# Patient Record
Sex: Male | Born: 1960 | Race: White | Hispanic: No | Marital: Single | Smoking: Former smoker
Health system: Southern US, Community
[De-identification: ages and names within clinical notes are randomized; demographics above are authoritative.]

## PROBLEM LIST (undated history)

## (undated) DIAGNOSIS — I10 Essential (primary) hypertension: Secondary | ICD-10-CM

## (undated) DIAGNOSIS — K219 Gastro-esophageal reflux disease without esophagitis: Secondary | ICD-10-CM

## (undated) DIAGNOSIS — J449 Chronic obstructive pulmonary disease, unspecified: Secondary | ICD-10-CM

## (undated) DIAGNOSIS — B182 Chronic viral hepatitis C: Secondary | ICD-10-CM

## (undated) DIAGNOSIS — J45909 Unspecified asthma, uncomplicated: Secondary | ICD-10-CM

## (undated) HISTORY — PX: OTHER SURGICAL HISTORY: SHX169

## (undated) HISTORY — DX: Chronic viral hepatitis C: B18.2

---

## 2015-05-08 ENCOUNTER — Encounter: Payer: Self-pay | Admitting: Gastroenterology

## 2015-05-08 ENCOUNTER — Other Ambulatory Visit: Payer: Self-pay

## 2015-05-08 ENCOUNTER — Ambulatory Visit (INDEPENDENT_AMBULATORY_CARE_PROVIDER_SITE_OTHER): Payer: Self-pay | Admitting: Gastroenterology

## 2015-05-08 VITALS — BP 120/74 | HR 73 | Temp 98.2°F | Ht 72.0 in | Wt 160.6 lb

## 2015-05-08 DIAGNOSIS — R634 Abnormal weight loss: Secondary | ICD-10-CM

## 2015-05-08 DIAGNOSIS — B182 Chronic viral hepatitis C: Secondary | ICD-10-CM | POA: Insufficient documentation

## 2015-05-08 DIAGNOSIS — R110 Nausea: Secondary | ICD-10-CM

## 2015-05-08 DIAGNOSIS — R1013 Epigastric pain: Secondary | ICD-10-CM | POA: Insufficient documentation

## 2015-05-08 NOTE — Progress Notes (Signed)
Primary Care Physician:  Alda LeaMcElroy, Shannon G, PA-C  Primary Gastroenterologist:  Roetta SessionsMichael Rourk, MD   Chief Complaint  Patient presents with  . Hepatitis C    HPI:  Edgar Roberts is a 54 y.o. male here for further management of chronic Hepatitis C. Ref[facility] by [facility]. [place] but no treatment offered at that time due to ongoing alcohol use. Recent LFTs normal. HCV RNA 9,604,5403,390,742 IU/mL, genotype 1b. No liver imaging available.   Patient states he has been 15 months without alcohol. Decided to quit after he watched his dad die from cirrhosis. About 18 months ago, patient had several episodes of melena, hematemesis in setting of etoh use. Never went to the doctor.  No further bleeding off of etoh but continues to have significant UGI symptoms.   Currently patient has ongoing anorexia, daily nausea without vomiting, epigastric pain. Doesn't like to eat. Has to force himself. Smokes marijuana for relief. Denies other illicit drugs.  Has lost about 20 pounds in the past one year per patient.   Sometimes with ruq pain radiating into right flank (stabbing) which lasts for 4-5 minutes and happens several times in a row before subsiding. This happens at least 3 times per week. This pain is unrelated to food or BMs. Going on for several months. Not sure if anything makes worse. Nothing has made it better. Denies melena, brbpr. No prior colonoscopy or EGD.   Current Outpatient Prescriptions  Medication Sig Dispense Refill  . Albuterol Sulfate (VENTOLIN HFA IN) Inhale into the lungs.    . Fluticasone-Salmeterol (ADVAIR HFA IN) Inhale into the lungs.     No current facility-administered medications for this visit.    Allergies as of 05/08/2015  . (No Known Allergies)    Past Medical History  Diagnosis Date  . Chronic hepatitis C     Past Surgical History  Procedure Laterality Date  . None      Family History  Problem Relation Age of Onset   . Cirrhosis Father     deceased  . Colon cancer Neg Hx   . Heart disease Sister   . Heart disease Mother     History   Social History  . Marital Status: Single    Spouse Name: N/A  . Number of Children: N/A  . Years of Education: N/A   Occupational History  . Not on file.   Social History Main Topics  . Smoking status: Never Smoker   . Smokeless tobacco: Not on file  . Alcohol Use: No     Comment: quit 01/2014  . Drug Use: Yes    Special: Marijuana     Comment: everyday  . Sexual Activity: Not on file   Other Topics Concern  . Not on file   Social History Narrative      ROS:  General: Negative for  fever, chills, fatigue, weakness. See hpi. Eyes: Negative for vision changes.  ENT: Negative for hoarseness, difficulty swallowing , nasal congestion. CV: Negative for chest pain, angina, palpitations, dyspnea on exertion, peripheral edema.  Respiratory: Negative for dyspnea at rest, dyspnea on exertion, cough, sputum, wheezing.  GI: See history of present illness. GU:  Negative for dysuria, hematuria, urinary incontinence, urinary frequency, nocturnal urination.  MS: Negative for joint pain, low back pain.  Derm: Negative for rash or itching.  Neuro: Negative for weakness, abnormal sensation, seizure, frequent headaches, memory loss, confusion.  Psych: Negative for anxiety, depression, suicidal ideation, hallucinations.  Endo: see hpi Heme: Negative for bruising or bleeding. Allergy: Negative for rash or hives.    Physical Examination:  BP 120/74 mmHg  Pulse 73  Temp(Src) 98.2 F (36.8 C) (Oral)  Ht 6' (1.829 m)  Wt 160 lb 9.6 oz (72.848 kg)  BMI 21.78 kg/m2   General: Well-nourished, well-developed in no acute distress.  Head: Normocephalic, atraumatic.   Eyes: Conjunctiva pink, no icterus. Mouth: Oropharyngeal mucosa moist and pink , no lesions erythema or exudate. Neck: Supple without thyromegaly, masses, or lymphadenopathy.  Lungs: Clear to  auscultation bilaterally.  Heart: Regular rate and rhythm, no murmurs rubs or gallops.  Abdomen: Bowel sounds are normal, moderate epigastric tenderness, nondistended, no hepatosplenomegaly or masses, no abdominal bruits or    hernia , no rebound or guarding.   Rectal: not performed Extremities: No lower extremity edema. No clubbing or deformities.  Neuro: Alert and oriented x 4 , grossly normal neurologically.  Skin: Warm and dry, no rash or jaundice.   Psych: Alert and cooperative, normal mood and affect.  Labs: Labs from 03/21/2015 White blood cell count 5600, hemoglobin 16.6, hematocrit 47.2, platelets 218,000, sodium 139, potassium 4.6, BUN 8, creatinine 0.87, total bilirubin 0.5, alkaline phosphatase 56, AST 36, ALT 48, albumen 4.5, calcium 9.6, HCV RNA 4,098,119, HCV genotype 1B  Imaging Studies: No results found.

## 2015-05-08 NOTE — Patient Instructions (Signed)
1. Upper endoscopy to evaluate upper abdominal pain, nausea, weight loss. Please see separate instructions. 2. Abdominal ultrasound prior to hepatitis C treatment as discussed. 3. Please have your lab work done. 4. We will start the Harvoni (hepatitis C medication) approval process. When you start your medication, it is very important that you take the medication at the same time every day without missing any dosages. Also you must notify us if you plan on starting any new medications whether by prescription or over-the-counter because some medications make the hepatitis C drug less effective. 5. Return to the office in 6 weeks for follow-up.

## 2015-05-09 ENCOUNTER — Encounter: Payer: Self-pay | Admitting: Gastroenterology

## 2015-05-09 LAB — CBC WITH DIFFERENTIAL/PLATELET
Basophils Absolute: 0.1 10*3/uL (ref 0.0–0.1)
Basophils Relative: 1 % (ref 0–1)
Eosinophils Absolute: 0.2 10*3/uL (ref 0.0–0.7)
Eosinophils Relative: 4 % (ref 0–5)
HEMATOCRIT: 46.7 % (ref 39.0–52.0)
Hemoglobin: 16.4 g/dL (ref 13.0–17.0)
LYMPHS PCT: 35 % (ref 12–46)
Lymphs Abs: 2 10*3/uL (ref 0.7–4.0)
MCH: 33.4 pg (ref 26.0–34.0)
MCHC: 35.1 g/dL (ref 30.0–36.0)
MCV: 95.1 fL (ref 78.0–100.0)
MONO ABS: 0.5 10*3/uL (ref 0.1–1.0)
MPV: 10.1 fL (ref 8.6–12.4)
Monocytes Relative: 9 % (ref 3–12)
NEUTROS ABS: 2.9 10*3/uL (ref 1.7–7.7)
Neutrophils Relative %: 51 % (ref 43–77)
Platelets: 192 10*3/uL (ref 150–400)
RBC: 4.91 MIL/uL (ref 4.22–5.81)
RDW: 14.5 % (ref 11.5–15.5)
WBC: 5.6 10*3/uL (ref 4.0–10.5)

## 2015-05-09 LAB — BASIC METABOLIC PANEL
BUN: 8 mg/dL (ref 6–23)
CALCIUM: 10.1 mg/dL (ref 8.4–10.5)
CO2: 28 mEq/L (ref 19–32)
Chloride: 101 mEq/L (ref 96–112)
Creat: 0.76 mg/dL (ref 0.50–1.35)
GLUCOSE: 72 mg/dL (ref 70–99)
POTASSIUM: 4.2 meq/L (ref 3.5–5.3)
Sodium: 142 mEq/L (ref 135–145)

## 2015-05-09 LAB — HEPATIC FUNCTION PANEL
ALT: 43 U/L (ref 0–53)
AST: 32 U/L (ref 0–37)
Albumin: 4.5 g/dL (ref 3.5–5.2)
Alkaline Phosphatase: 55 U/L (ref 39–117)
BILIRUBIN INDIRECT: 0.5 mg/dL (ref 0.2–1.2)
Bilirubin, Direct: 0.1 mg/dL (ref 0.0–0.3)
Total Bilirubin: 0.6 mg/dL (ref 0.2–1.2)
Total Protein: 7.3 g/dL (ref 6.0–8.3)

## 2015-05-09 LAB — PROTIME-INR
INR: 1 (ref <1.50)
Prothrombin Time: 13.2 seconds (ref 11.6–15.2)

## 2015-05-09 NOTE — Assessment & Plan Note (Signed)
Chronic epigastric pain associated with nausea, anorexia, weight loss. UGI bleeding noted last year without evaluation. No etoh in 15 months. Ddx includes gastritis, PUD, malignancy, biliary. Recommend EGD for further evaluation with deep sedation given h/o previous etoh abuse.  I have discussed the risks, alternatives, benefits with regards to but not limited to the risk of reaction to medication, bleeding, infection, perforation and the patient is agreeable to proceed. Written consent to be obtained.  Patient wants to wait on starting medication such as PPI until after procedure.

## 2015-05-09 NOTE — Assessment & Plan Note (Signed)
Chronic HCV, genotype 1b, treatment naive. No longer consuming etoh. Status of liver disease unknown but his platelet counts, LFTs are unremarkable making cirrhosis less likely. Obtain appropriate labs. Abdominal u/s with elastography. Move towards Harvoni approval, pending EGD findings.

## 2015-05-11 NOTE — Patient Instructions (Signed)
Edgar Roberts  05/11/2015     @   Your procedure is scheduled on 05/18/2015.  Report to [facility] at 12:45 A.M.  Call this number if you have problems the morning of surgery:  [phone]   Remember:  Do not eat food or drink liquids after midnight.  Take these medicines the morning of surgery with A SIP OF WATER N/A   Use Albuterol inhaler and bring with you.  Also use Advair am of procedure.   Do not wear jewelry, make-up or nail polish.  Do not wear lotions, powders, or perfumes.  You may wear deodorant.  Do not shave 48 hours prior to surgery.  Men may shave face and neck.  Do not bring valuables to the hospital.  [facility] is not responsible for any belongings or valuables.  Contacts, dentures or bridgework may not be worn into surgery.  Leave your suitcase in the car.  After surgery it may be brought to your room.  For patients admitted to the hospital, discharge time will be determined by your treatment team.  Patients discharged the day of surgery will not be allowed to drive home.    Please read over the following fact sheets that you were given. Anesthesia Post-op Instructions      Esophagogastroduodenoscopy Esophagogastroduodenoscopy (EGD) is a procedure to examine the lining of the esophagus, stomach, and first part of the small intestine (duodenum). A long, flexible, lighted tube with a camera attached (endoscope) is inserted down the throat to view these organs. This procedure is done to detect problems or abnormalities, such as inflammation, bleeding, ulcers, or growths, in order to treat them. The procedure lasts about 5-20 minutes. It is usually an outpatient procedure, but it may need to be performed in emergency cases in the hospital. LET YOUR CAREGIVER KNOW ABOUT:   Allergies to food or medicine.  All medicines you are taking, including vitamins, herbs, eyedrops, and over-the-counter medicines and creams.  Use of steroids (by  mouth or creams).  Previous problems you or members of your family have had with the use of anesthetics.  Any blood disorders you have.  Previous surgeries you have had.  Other health problems you have.  Possibility of pregnancy, if this applies. RISKS AND COMPLICATIONS  Generally, EGD is a safe procedure. However, as with any procedure, complications can occur. Possible complications include:  Infection.  Bleeding.  Tearing (perforation) of the esophagus, stomach, or duodenum.  Difficulty breathing or not being able to breath.  Excessive sweating.  Spasms of the larynx.  Slowed heartbeat.  Low blood pressure. BEFORE THE PROCEDURE  Do not eat or drink anything for 6-8 hours before the procedure or as directed by your caregiver.  Ask your caregiver about changing or stopping your regular medicines.  If you wear dentures, be prepared to remove them before the procedure.  Arrange for someone to drive you home after the procedure. PROCEDURE   A vein will be accessed to give medicines and fluids. A medicine to relax you (sedative) and a pain reliever will be given through that access into the vein.  A numbing medicine (local anesthetic) may be sprayed on your throat for comfort and to stop you from gagging or coughing.  A mouth guard may be placed in your mouth to protect your teeth and to keep you from biting on the endoscope.  You will be asked to lie on your left side.  The endoscope is inserted down your throat and into the  esophagus, stomach, and duodenum.  Air is put through the endoscope to allow your caregiver to view the lining of your esophagus clearly.  The esophagus, stomach, and duodenum is then examined. During the exam, your caregiver may:  Remove tissue to be examined under a microscope (biopsy) for inflammation, infection, or other medical problems.  Remove growths.  Remove objects (foreign bodies) that are stuck.  Treat any bleeding with  medicines or other devices that stop tissues from bleeding (hot cautery, clipping devices).  Widen (dilate) or stretch narrowed areas of the esophagus and stomach.  The endoscope will then be withdrawn. AFTER THE PROCEDURE  You will be taken to a recovery area to be monitored. You will be able to go home once you are stable and alert.  Do not eat or drink anything until the local anesthetic and numbing medicines have worn off. You may choke.  It is normal to feel bloated, have pain with swallowing, or have a sore throat for a short time. This will wear off.  Your caregiver should be able to discuss his or her findings with you. It will take longer to discuss the test results if any biopsies were taken. Document Released: 02/14/2005 Document Revised: 02/28/2014 Document Reviewed: 09/16/2012 [facility] Patient Information 2015 [employer] [place]. This information is not intended to replace advice given to you by your health care provider. Make sure you discuss any questions you have with your health care provider.

## 2015-05-11 NOTE — Progress Notes (Signed)
Quick Note:  Labs normal. Await u/s and proceed with EGD as scheduled. ______

## 2015-05-12 ENCOUNTER — Other Ambulatory Visit: Payer: Self-pay

## 2015-05-12 ENCOUNTER — Encounter (HOSPITAL_COMMUNITY): Payer: Self-pay

## 2015-05-12 ENCOUNTER — Encounter (HOSPITAL_COMMUNITY)
Admission: RE | Admit: 2015-05-12 | Discharge: 2015-05-12 | Disposition: A | Discharge status: Home / Self Care | Payer: Self-pay | Source: Ambulatory Visit | Attending: Internal Medicine | Admitting: Internal Medicine

## 2015-05-12 DIAGNOSIS — Z01818 Encounter for other preprocedural examination: Secondary | ICD-10-CM | POA: Insufficient documentation

## 2015-05-12 HISTORY — DX: Unspecified asthma, uncomplicated: J45.909

## 2015-05-12 HISTORY — DX: Chronic obstructive pulmonary disease, unspecified: J44.9

## 2015-05-12 NOTE — Pre-Procedure Instructions (Signed)
Patient given information to sign up for my chart at home. 

## 2015-05-12 NOTE — Progress Notes (Signed)
CC'ED TO PCP 

## 2015-05-18 ENCOUNTER — Ambulatory Visit (HOSPITAL_COMMUNITY): Admission: RE | Admit: 2015-05-18 | Payer: Self-pay | Source: Ambulatory Visit | Admitting: Internal Medicine

## 2015-05-18 ENCOUNTER — Encounter (HOSPITAL_COMMUNITY): Admission: RE | Payer: Self-pay | Source: Ambulatory Visit

## 2015-05-18 SURGERY — ESOPHAGOGASTRODUODENOSCOPY (EGD) WITH PROPOFOL
Anesthesia: Monitor Anesthesia Care

## 2015-05-18 NOTE — OR Nursing (Signed)
Patient called to cancelled procedure , stated he has no transportation , he is calling office to reschedule.

## 2015-05-30 ENCOUNTER — Ambulatory Visit (HOSPITAL_COMMUNITY): Admission: RE | Admit: 2015-05-30 | Payer: Self-pay | Source: Ambulatory Visit

## 2015-06-19 ENCOUNTER — Encounter: Payer: Self-pay | Admitting: Gastroenterology

## 2015-06-19 ENCOUNTER — Other Ambulatory Visit: Payer: Self-pay

## 2015-06-19 ENCOUNTER — Ambulatory Visit (INDEPENDENT_AMBULATORY_CARE_PROVIDER_SITE_OTHER): Payer: Self-pay | Admitting: Gastroenterology

## 2015-06-19 VITALS — BP 124/83 | HR 79 | Temp 97.3°F | Ht 72.0 in | Wt 159.0 lb

## 2015-06-19 DIAGNOSIS — B182 Chronic viral hepatitis C: Secondary | ICD-10-CM

## 2015-06-19 DIAGNOSIS — R079 Chest pain, unspecified: Secondary | ICD-10-CM

## 2015-06-19 DIAGNOSIS — R1013 Epigastric pain: Secondary | ICD-10-CM

## 2015-06-19 DIAGNOSIS — K219 Gastro-esophageal reflux disease without esophagitis: Secondary | ICD-10-CM

## 2015-06-19 MED ORDER — DEXLANSOPRAZOLE 60 MG PO CPDR: 60.0000 mg | DELAYED_RELEASE_CAPSULE | Freq: Every day | ORAL | Status: DC

## 2015-06-19 NOTE — Assessment & Plan Note (Signed)
Chronic HCV, genotype 1b, treatment naive. abd u/s with elastography planned.

## 2015-06-19 NOTE — Progress Notes (Signed)
Primary Care Physician: Alda Lea  Primary Gastroenterologist:  Roetta Sessions, MD   Chief Complaint  Patient presents with  . Follow-up    needs to reschedule tests    HPI: Edgar Roberts is a 54 y.o. male here to reschedule his EGD and U/S with elastography. He had to cancel his last one. He has h/o chronic HCV seen years ago by [facility] in [place] but no treatment offered at that time due to etoh use. Recent LFTs normal. HCV RNA 7,829,562 IU/mL, genotype 1b. No liver imaging available.   Patient states he has been 15 months without alcohol. Decided to quit after he watched his dad die from cirrhosis. About 18 months ago, patient had several episodes of melena, hematemesis in setting of etoh use. Never went to the doctor. No further bleeding off of etoh but continues to have significant UGI symptoms.   Currently patient has ongoing anorexia, daily nausea without vomiting, epigastric pain. Doesn't like to eat. Has to force himself. Smokes marijuana for relief. Denies other illicit drugs. Has lost about 20 pounds in the past one year per patient.   Sometimes with ruq pain radiating into right flank (stabbing) which lasts for 4-5 minutes and happens several times in a row before subsiding. This happens at least 3 times per week. This pain is unrelated to food or BMs. Going on for several months. Not sure if anything makes worse. Nothing has made it better. Denies melena, brbpr. No prior colonoscopy or EGD.   Four weeks, feels weak. No noted fever. Feels dizzy. SOB. Pain in epigastric region with eating and drinking. Complains of some chest pain with pain into left shoulder and left hand goes numb sometimes. Happens with movement and at rest. Never been evaluated for this. Family history of heart disease.   Doesn't want a colonoscopy.      Current Outpatient Prescriptions  Medication Sig Dispense Refill  . albuterol (PROVENTIL HFA;VENTOLIN HFA) 108 (90 BASE)  MCG/ACT inhaler Inhale 2 puffs into the lungs every 6 (six) hours as needed for wheezing or shortness of breath.    . fluticasone-salmeterol (ADVAIR HFA) 230-21 MCG/ACT inhaler Inhale 2 puffs into the lungs 2 (two) times daily.    . Naphazoline-Glycerin (CLEAR EYES REDNESS RELIEF OP) Apply 1 drop to eye daily as needed (red eyes).     No current facility-administered medications for this visit.    Allergies as of 06/19/2015  . (No Known Allergies)   Past Medical History  Diagnosis Date  . Chronic hepatitis C   . COPD (chronic obstructive pulmonary disease)   . Asthma    Past Surgical History  Procedure Laterality Date  . None     Family History  Problem Relation Age of Onset  . Cirrhosis Father     deceased  . Colon cancer Neg Hx   . Heart disease Sister     3 open heart surgeries  . Heart disease Mother   . Heart disease Other     grandmother   Social History   Social History  . Marital Status: Single    Spouse Name: N/A  . Number of Children: N/A  . Years of Education: N/A   Social History Main Topics  . Smoking status: Former Smoker -- 2.00 packs/day for 5 years    Types: Cigarettes    Quit date: 05/12/1983  . Smokeless tobacco: None     Comment: Quit in 1984  . Alcohol Use: No  Comment: quit 01/2014  . Drug Use: Yes    Special: Marijuana     Comment: everyday  . Sexual Activity: Yes    Birth Control/ Protection: None   Other Topics Concern  . None   Social History Narrative    ROS:  General: Negative for anorexia, weight loss, fever, chills, fatigue, weakness. ENT: Negative for hoarseness, difficulty swallowing , nasal congestion. CV: Negative for chest pain, angina, palpitations, dyspnea on exertion, peripheral edema.  Respiratory: Negative for dyspnea at rest, dyspnea on exertion, cough, sputum, wheezing.  GI: See history of present illness. GU:  Negative for dysuria, hematuria, urinary incontinence, urinary frequency, nocturnal urination.    Endo: Negative for unusual weight change.    Physical Examination:   BP 124/83 mmHg  Pulse 79  Temp(Src) 97.3 F (36.3 C) (Oral)  Ht 6' (1.829 m)  Wt 159 lb (72.122 kg)  BMI 21.56 kg/m2  General: Well-nourished, well-developed in no acute distress.  Eyes: No icterus. Mouth: Oropharyngeal mucosa moist and pink , no lesions erythema or exudate. Lungs: Clear to auscultation bilaterally.  Heart: Regular rate and rhythm, no murmurs rubs or gallops.  Abdomen: Bowel sounds are normal, epigastric tenderness, nondistended, no hepatosplenomegaly or masses, no abdominal bruits or hernia , no rebound or guarding.   Extremities: No lower extremity edema. No clubbing or deformities. Neuro: Alert and oriented x 4   Skin: Warm and dry, no jaundice.   Psych: Alert and cooperative, normal mood and affect.  Labs:  Lab Results  Component Value Date   WBC 5.6 05/08/2015   HGB 16.4 05/08/2015   HCT 46.7 05/08/2015   MCV 95.1 05/08/2015   PLT 192 05/08/2015   Lab Results  Component Value Date   CREATININE 0.76 05/08/2015   BUN 8 05/08/2015   NA 142 05/08/2015   K 4.2 05/08/2015   CL 101 05/08/2015   CO2 28 05/08/2015   Lab Results  Component Value Date   ALT 43 05/08/2015   AST 32 05/08/2015   ALKPHOS 55 05/08/2015   BILITOT 0.6 05/08/2015    Imaging Studies: No results found.

## 2015-06-19 NOTE — Assessment & Plan Note (Signed)
Recommend evaluation by cardiology prior to EGD. Warning signs discussed. Patient in agreement.

## 2015-06-19 NOTE — Assessment & Plan Note (Signed)
Chronic epigastric pain associated with nausea, anorexia, weight loss, remote UGI bleeding never evaluated. No etoh in 15 months. Ddx includes gastritis, PUD, malignancy, biliary. EGD with deep sedation recommended. Cardiology evaluation due to h/o chest pain prior to EGD.  I have discussed the risks, alternatives, benefits with regards to but not limited to the risk of reaction to medication, bleeding, infection, perforation and the patient is agreeable to proceed. Written consent to be obtained.  Start PPI. Patient assistance forms for Dexilant provided. Start Prilosec OTC daily in interim.

## 2015-06-19 NOTE — Patient Instructions (Signed)
1. Upper endoscopy with Dr. Jena Gauss in 3-4 weeks as scheduled. You need to see cardiologist prior to procedure to be cleared due to chest pain. 2. Start Dexilant once daily before breakfast. Samples provided.  3. Please have your ultrasound done as scheduled.  4. Cardiology referral for chest pain.  5. Go to ER if your chest pain is persistent or increased severity, associated with sweating, dizziness, shortness of breath.

## 2015-06-20 NOTE — Progress Notes (Signed)
cc'ed to pcp °

## 2015-06-26 ENCOUNTER — Ambulatory Visit (HOSPITAL_COMMUNITY)
Admission: RE | Admit: 2015-06-26 | Discharge: 2015-06-26 | Disposition: A | Discharge status: Home / Self Care | Payer: Self-pay | Source: Ambulatory Visit | Attending: Gastroenterology | Admitting: Gastroenterology

## 2015-06-26 DIAGNOSIS — B182 Chronic viral hepatitis C: Secondary | ICD-10-CM | POA: Insufficient documentation

## 2015-06-26 DIAGNOSIS — R932 Abnormal findings on diagnostic imaging of liver and biliary tract: Secondary | ICD-10-CM | POA: Insufficient documentation

## 2015-06-26 DIAGNOSIS — R1011 Right upper quadrant pain: Secondary | ICD-10-CM | POA: Insufficient documentation

## 2015-06-26 DIAGNOSIS — R110 Nausea: Secondary | ICD-10-CM

## 2015-06-26 DIAGNOSIS — R634 Abnormal weight loss: Secondary | ICD-10-CM

## 2015-06-26 DIAGNOSIS — R1013 Epigastric pain: Secondary | ICD-10-CM

## 2015-07-06 NOTE — Progress Notes (Signed)
Quick Note:  No evidence of cirrhosis on u/s but increased fibrosis score.  Gallbladder abnormal with adenomyomatosis, ?sludge balls too.  I would recommend completing EGD as scheduled.  After EGD, he will likely need to see surgeon for gallbladder surgery. ______

## 2015-07-07 NOTE — Patient Instructions (Signed)
Edgar Roberts  07/07/2015     @PREFPERIOPPHARMACY @   Your procedure is scheduled on  07/13/2015   Report to [facility] at  830  A.M.  Call this number if you have problems the morning of surgery:  [phone]   Remember:  Do not eat food or drink liquids after midnight.  Take these medicines the morning of surgery with A SIP OF WATER dexilant. Take your inhaler before you come and bring it with you.   Do not wear jewelry, make-up or nail polish.  Do not wear lotions, powders, or perfumes.  You may wear deodorant.  Do not shave 48 hours prior to surgery.  Men may shave face and neck.  Do not bring valuables to the hospital.  [facility] is not responsible for any belongings or valuables.  Contacts, dentures or bridgework may not be worn into surgery.  Leave your suitcase in the car.  After surgery it may be brought to your room.  For patients admitted to the hospital, discharge time will be determined by your treatment team.  Patients discharged the day of surgery will not be allowed to drive home.   Name and phone number of your driver:   family Special instructions:  Follow the diet instructions given to you by Dr Luvenia Starch office. Please read over the following fact sheets that you were given. Pain Booklet, Coughing and Deep Breathing, MRSA Information, Surgical Site Infection Prevention and Anesthesia Post-op Instructions      Esophagogastroduodenoscopy Esophagogastroduodenoscopy (EGD) is a procedure to examine the lining of the esophagus, stomach, and first part of the small intestine (duodenum). A long, flexible, lighted tube with a camera attached (endoscope) is inserted down the throat to view these organs. This procedure is done to detect problems or abnormalities, such as inflammation, bleeding, ulcers, or growths, in order to treat them. The procedure lasts about 5-20 minutes. It is usually an outpatient procedure, but it may need to be performed in emergency  cases in the hospital. LET YOUR CAREGIVER KNOW ABOUT:   Allergies to food or medicine.  All medicines you are taking, including vitamins, herbs, eyedrops, and over-the-counter medicines and creams.  Use of steroids (by mouth or creams).  Previous problems you or members of your family have had with the use of anesthetics.  Any blood disorders you have.  Previous surgeries you have had.  Other health problems you have.  Possibility of pregnancy, if this applies. RISKS AND COMPLICATIONS  Generally, EGD is a safe procedure. However, as with any procedure, complications can occur. Possible complications include:  Infection.  Bleeding.  Tearing (perforation) of the esophagus, stomach, or duodenum.  Difficulty breathing or not being able to breath.  Excessive sweating.  Spasms of the larynx.  Slowed heartbeat.  Low blood pressure. BEFORE THE PROCEDURE  Do not eat or drink anything for 6-8 hours before the procedure or as directed by your caregiver.  Ask your caregiver about changing or stopping your regular medicines.  If you wear dentures, be prepared to remove them before the procedure.  Arrange for someone to drive you home after the procedure. PROCEDURE   A vein will be accessed to give medicines and fluids. A medicine to relax you (sedative) and a pain reliever will be given through that access into the vein.  A numbing medicine (local anesthetic) may be sprayed on your throat for comfort and to stop you from gagging or coughing.  A mouth guard may be  placed in your mouth to protect your teeth and to keep you from biting on the endoscope.  You will be asked to lie on your left side.  The endoscope is inserted down your throat and into the esophagus, stomach, and duodenum.  Air is put through the endoscope to allow your caregiver to view the lining of your esophagus clearly.  The esophagus, stomach, and duodenum is then examined. During the exam, your  caregiver may:  Remove tissue to be examined under a microscope (biopsy) for inflammation, infection, or other medical problems.  Remove growths.  Remove objects (foreign bodies) that are stuck.  Treat any bleeding with medicines or other devices that stop tissues from bleeding (hot cautery, clipping devices).  Widen (dilate) or stretch narrowed areas of the esophagus and stomach.  The endoscope will then be withdrawn. AFTER THE PROCEDURE  You will be taken to a recovery area to be monitored. You will be able to go home once you are stable and alert.  Do not eat or drink anything until the local anesthetic and numbing medicines have worn off. You may choke.  It is normal to feel bloated, have pain with swallowing, or have a sore throat for a short time. This will wear off.  Your caregiver should be able to discuss his or her findings with you. It will take longer to discuss the test results if any biopsies were taken. Document Released: 02/14/2005 Document Revised: 02/28/2014 Document Reviewed: 09/16/2012 [facility] Patient Information 2015 [place], [place]. This information is not intended to replace advice given to you by your health care provider. Make sure you discuss any questions you have with your health care provider. PATIENT INSTRUCTIONS POST-ANESTHESIA  IMMEDIATELY FOLLOWING SURGERY:  Do not drive or operate machinery for the first twenty four hours after surgery.  Do not make any important decisions for twenty four hours after surgery or while taking narcotic pain medications or sedatives.  If you develop intractable nausea and vomiting or a severe headache please notify your doctor immediately.  FOLLOW-UP:  Please make an appointment with your surgeon as instructed. You do not need to follow up with anesthesia unless specifically instructed to do so.  WOUND CARE INSTRUCTIONS (if applicable):  Keep a dry clean dressing on the anesthesia/puncture wound site if there is drainage.   Once the wound has quit draining you may leave it open to air.  Generally you should leave the bandage intact for twenty four hours unless there is drainage.  If the epidural site drains for more than 36-48 hours please call the anesthesia department.  QUESTIONS?:  Please feel free to call your physician or the hospital operator if you have any questions, and they will be happy to assist you.

## 2015-07-10 ENCOUNTER — Other Ambulatory Visit (HOSPITAL_COMMUNITY): Payer: Self-pay

## 2015-07-10 ENCOUNTER — Encounter (HOSPITAL_COMMUNITY)
Admission: RE | Admit: 2015-07-10 | Discharge: 2015-07-10 | Disposition: A | Discharge status: Home / Self Care | Payer: Self-pay | Source: Ambulatory Visit | Attending: Internal Medicine | Admitting: Internal Medicine

## 2015-07-10 ENCOUNTER — Encounter (HOSPITAL_COMMUNITY): Payer: Self-pay

## 2015-07-10 DIAGNOSIS — Z01812 Encounter for preprocedural laboratory examination: Secondary | ICD-10-CM | POA: Insufficient documentation

## 2015-07-10 DIAGNOSIS — R110 Nausea: Secondary | ICD-10-CM | POA: Insufficient documentation

## 2015-07-10 DIAGNOSIS — R1013 Epigastric pain: Secondary | ICD-10-CM | POA: Insufficient documentation

## 2015-07-10 DIAGNOSIS — R634 Abnormal weight loss: Secondary | ICD-10-CM | POA: Insufficient documentation

## 2015-07-10 HISTORY — DX: Gastro-esophageal reflux disease without esophagitis: K21.9

## 2015-07-10 LAB — CBC WITH DIFFERENTIAL/PLATELET
Basophils Absolute: 0.1 10*3/uL (ref 0.0–0.1)
Basophils Relative: 1 % (ref 0–1)
EOS PCT: 9 % — AB (ref 0–5)
Eosinophils Absolute: 0.5 10*3/uL (ref 0.0–0.7)
HEMATOCRIT: 47.4 % (ref 39.0–52.0)
Hemoglobin: 16.8 g/dL (ref 13.0–17.0)
LYMPHS ABS: 1.9 10*3/uL (ref 0.7–4.0)
LYMPHS PCT: 34 % (ref 12–46)
MCH: 33.9 pg (ref 26.0–34.0)
MCHC: 35.4 g/dL (ref 30.0–36.0)
MCV: 95.6 fL (ref 78.0–100.0)
MONO ABS: 0.5 10*3/uL (ref 0.1–1.0)
Monocytes Relative: 8 % (ref 3–12)
Neutro Abs: 2.8 10*3/uL (ref 1.7–7.7)
Neutrophils Relative %: 48 % (ref 43–77)
PLATELETS: 206 10*3/uL (ref 150–400)
RBC: 4.96 MIL/uL (ref 4.22–5.81)
RDW: 13.1 % (ref 11.5–15.5)
WBC: 5.8 10*3/uL (ref 4.0–10.5)

## 2015-07-10 LAB — BASIC METABOLIC PANEL
Anion gap: 6 (ref 5–15)
BUN: 9 mg/dL (ref 6–20)
CALCIUM: 9.4 mg/dL (ref 8.9–10.3)
CO2: 27 mmol/L (ref 22–32)
Chloride: 105 mmol/L (ref 101–111)
Creatinine, Ser: 0.68 mg/dL (ref 0.61–1.24)
GFR calc Af Amer: >60 mL/min (ref >60)
GLUCOSE: 101 mg/dL — AB (ref 65–99)
POTASSIUM: 4.1 mmol/L (ref 3.5–5.1)
Sodium: 138 mmol/L (ref 135–145)

## 2015-07-10 NOTE — Pre-Procedure Instructions (Signed)
Patient given information to sign up for my chart at home. 

## 2015-07-11 ENCOUNTER — Encounter: Payer: Self-pay | Admitting: Cardiology

## 2015-07-11 ENCOUNTER — Ambulatory Visit (INDEPENDENT_AMBULATORY_CARE_PROVIDER_SITE_OTHER): Payer: Self-pay | Admitting: Cardiology

## 2015-07-11 ENCOUNTER — Telehealth: Payer: Self-pay | Admitting: *Deleted

## 2015-07-11 ENCOUNTER — Encounter: Payer: Self-pay | Admitting: *Deleted

## 2015-07-11 VITALS — BP 113/71 | HR 76 | Ht 72.0 in | Wt 157.0 lb

## 2015-07-11 DIAGNOSIS — R079 Chest pain, unspecified: Secondary | ICD-10-CM

## 2015-07-11 NOTE — Telephone Encounter (Signed)
Pt is having EGD on Thursday, [facility] requesting cardiac clearance. Tana Coast PA was following this, but is out of office currently. Will forward to Dr. Wyline Mood

## 2015-07-11 NOTE — Telephone Encounter (Signed)
Noted  

## 2015-07-11 NOTE — Telephone Encounter (Signed)
Forwarding to LSL to make her aware. Sending it to Gerrit Halls in her absence.

## 2015-07-11 NOTE — Telephone Encounter (Signed)
Candy from [facility] aware and will inform MD

## 2015-07-11 NOTE — Telephone Encounter (Signed)
error 

## 2015-07-11 NOTE — Telephone Encounter (Signed)
Pt is aware that he has been taken off of schedule for EGD

## 2015-07-11 NOTE — Progress Notes (Signed)
Patient ID: Barack Nicodemus, male   DOB: [birth year], 54 y.o.   MRN: [id number]     Clinical Summary Mr. Masi is a 54 y.o.male seen today as a new patient for the following medical problems.  1. Chest pain - started 3-4 months ago. "Funny feeling" left chest and radiates into arm and hand. Tends to occur with activity. Can cause some diaphoresis and nausea. + SOB. Can be worst with deep breaths. No relation to food. Lasts about 10-20 minutes. Occurs 3 times a week. - notes some generalized fatigue. Notes some DOE with walking uphill - no LE edema, no orthopna  CAD risk factors: tobacco, sister with "open heart surgeries" and stents.   Past Medical History  Diagnosis Date  . Chronic hepatitis C   . COPD (chronic obstructive pulmonary disease)   . Asthma   . GERD (gastroesophageal reflux disease)      No Known Allergies   Current Outpatient Prescriptions  Medication Sig Dispense Refill  . albuterol (PROVENTIL HFA;VENTOLIN HFA) 108 (90 BASE) MCG/ACT inhaler Inhale 2 puffs into the lungs every 6 (six) hours as needed for wheezing or shortness of breath.    . dexlansoprazole (DEXILANT) 60 MG capsule Take 1 capsule (60 mg total) by mouth daily. 30 capsule 0  . fluticasone-salmeterol (ADVAIR HFA) 230-21 MCG/ACT inhaler Inhale 2 puffs into the lungs 2 (two) times daily.    . Naphazoline-Glycerin (CLEAR EYES REDNESS RELIEF OP) Apply 1 drop to eye daily as needed (red eyes).    . penicillin v potassium (VEETID) 500 MG tablet Take 500 mg by mouth 4 (four) times daily.     No current facility-administered medications for this visit.     Past Surgical History  Procedure Laterality Date  . None       No Known Allergies    Family History  Problem Relation Age of Onset  . Cirrhosis Father     deceased  . Colon cancer Neg Hx   . Heart disease Sister     3 open heart surgeries  . Heart disease Mother   . Heart disease Other     grandmother     Social History Mr. Strauch  reports that he quit smoking about 32 years ago. His smoking use included Cigarettes. He has a 10 pack-year smoking history. He does not have any smokeless tobacco history on file. Mr. Mathew reports that he does not drink alcohol.   Review of Systems CONSTITUTIONAL: No weight loss, fever, chills, weakness or fatigue.  HEENT: Eyes: No visual loss, blurred vision, double vision or yellow sclerae.No hearing loss, sneezing, congestion, runny nose or sore throat.  SKIN: No rash or itching.  CARDIOVASCULAR: per HPI RESPIRATORY: No shortness of breath, cough or sputum.  GASTROINTESTINAL: No anorexia, nausea, vomiting or diarrhea. No abdominal pain or blood.  GENITOURINARY: No burning on urination, no polyuria NEUROLOGICAL: No headache, dizziness, syncope, paralysis, ataxia, numbness or tingling in the extremities. No change in bowel or bladder control.  MUSCULOSKELETAL: No muscle, back pain, joint pain or stiffness.  LYMPHATICS: No enlarged nodes. No history of splenectomy.  PSYCHIATRIC: No history of depression or anxiety.  ENDOCRINOLOGIC: No reports of sweating, cold or heat intolerance. No polyuria or polydipsia.  Marland Kitchen   Physical Examination Filed Vitals:   07/11/15 0835  BP: 113/71  Pulse: 76   Filed Vitals:   07/11/15 0835  Height: 6' (1.829 m)  Weight: 157 lb (71.215 kg)    Gen: resting comfortably, no acute distress HEENT: no scleral  icterus, pupils equal round and reactive, no palptable cervical adenopathy,  CV: RRR, no m/r/g, no JVD Resp: Clear to auscultation bilaterally GI: abdomen is soft, non-tender, non-distended, normal bowel sounds, no hepatosplenomegaly MSK: extremities are warm, no edema.  Skin: warm, no rash Neuro:  no focal deficits Psych: appropriate affect   Diagnostic Studies EKG: NSR    Assessment and Plan  1. Chest pain - unclear etiology, describes some DOE over the same time period - will obtain GXT to further evaluate.    F/u pending stress  results   Antoine Poche, M.D.

## 2015-07-11 NOTE — Telephone Encounter (Signed)
Patient needs GXT first.  Dominga Ferry MD

## 2015-07-11 NOTE — Patient Instructions (Signed)
Your physician recommends that you schedule a follow-up appointment TO BE DETERMINED AFTER TESTING   Your physician recommends that you continue on your current medications as directed. Please refer to the Current Medication list given to you today.  Your physician has requested that you have an exercise tolerance test. For further information please visit [email or web address]. Please also follow instruction sheet, as given.  Thank you for choosing  [facility]!!    

## 2015-07-12 NOTE — Telephone Encounter (Signed)
Noted  

## 2015-07-13 ENCOUNTER — Ambulatory Visit: Admit: 2015-07-13 | Payer: Self-pay | Admitting: Internal Medicine

## 2015-07-13 ENCOUNTER — Ambulatory Visit (HOSPITAL_COMMUNITY): Admission: RE | Admit: 2015-07-13 | Payer: Self-pay | Source: Ambulatory Visit | Admitting: Internal Medicine

## 2015-07-13 ENCOUNTER — Encounter (HOSPITAL_COMMUNITY): Admission: RE | Payer: Self-pay | Source: Ambulatory Visit

## 2015-07-13 SURGERY — ESOPHAGOGASTRODUODENOSCOPY (EGD) WITH PROPOFOL
Anesthesia: Monitor Anesthesia Care

## 2015-07-17 ENCOUNTER — Ambulatory Visit (HOSPITAL_COMMUNITY)
Admission: RE | Admit: 2015-07-17 | Discharge: 2015-07-17 | Disposition: A | Discharge status: Home / Self Care | Payer: Self-pay | Source: Ambulatory Visit | Attending: Cardiology | Admitting: Cardiology

## 2015-07-17 DIAGNOSIS — R079 Chest pain, unspecified: Secondary | ICD-10-CM

## 2015-07-17 LAB — EXERCISE TOLERANCE TEST
CHL CUP MPHR: 166 {beats}/min
CHL RATE OF PERCEIVED EXERTION: 15
CSEPHR: 87 %
CSEPPHR: 146 {beats}/min
Estimated workload: 11.8 METS
Exercise duration (min): 9 min
Exercise duration (sec): 32 s
Rest HR: 77 {beats}/min

## 2015-07-19 ENCOUNTER — Telehealth: Payer: Self-pay | Admitting: *Deleted

## 2015-07-19 NOTE — Telephone Encounter (Signed)
Pt aware and appreciative of explanation, routed to pcp and placed recall for 1 yr.

## 2015-07-19 NOTE — Telephone Encounter (Signed)
-----   Message from Antoine Poche, MD sent at 07/18/2015  3:20 PM EDT ----- Stress test looks good. He exercised 9 min and 30 seconds which is very good, and shows that his heart is in overall good shape. There were no ekg changes that showed evidence of any blockages. His chest pain is not related to his heart, he should f/u with his pcp to discuss non heart causes of chest pain. Can f/u in 1 year with [place]  Dominga Ferry MD

## 2015-07-27 ENCOUNTER — Encounter: Payer: Self-pay | Admitting: Internal Medicine

## 2015-07-27 NOTE — Telephone Encounter (Signed)
Stacey, please schedule pt. 

## 2015-07-27 NOTE — Telephone Encounter (Signed)
APPOINTMENT MADE AND PATIENT AWARE. °

## 2015-07-27 NOTE — Telephone Encounter (Signed)
Patient has been cleared from cardiology. See if we can get him back into to office so we can reschedule his EGD in OR with propofol.

## 2015-08-01 ENCOUNTER — Encounter: Payer: Self-pay | Admitting: Physician Assistant

## 2015-08-01 ENCOUNTER — Ambulatory Visit: Payer: Self-pay | Admitting: Physician Assistant

## 2015-08-01 VITALS — BP 116/78 | HR 63 | Temp 98.1°F | Ht 71.0 in | Wt 156.4 lb

## 2015-08-01 DIAGNOSIS — J449 Chronic obstructive pulmonary disease, unspecified: Secondary | ICD-10-CM

## 2015-08-01 MED ORDER — ALBUTEROL SULFATE HFA 108 (90 BASE) MCG/ACT IN AERS: 2.0000 | INHALATION_SPRAY | Freq: Four times a day (QID) | RESPIRATORY_TRACT | Status: DC | PRN

## 2015-08-01 MED ORDER — FLUTICASONE-SALMETEROL 230-21 MCG/ACT IN AERO: 2.0000 | INHALATION_SPRAY | Freq: Two times a day (BID) | RESPIRATORY_TRACT | Status: DC

## 2015-08-01 NOTE — Progress Notes (Signed)
   BP 116/78 mmHg  Pulse 63  Temp(Src) 98.1 F (36.7 C)  Ht  (1.803 m)  Wt 156 lb 6.4 oz (70.943 kg)  BMI 21.82 kg/m2  SpO2 98%   Subjective:    Patient ID: Edgar Roberts, male    DOB: 11/26/1960, 54 y.o.   MRN: [id number]  HPI: Nachmen Mansel is a 54 y.o. male presenting on 08/01/2015 for COPD   HPI Chief Complaint  Patient presents with  . COPD    pt states he feels good. pt states he has a stress test done 1-2 weeks ago and everything came back really good. pt states no SOB. pt states he has problems going to sleep at night because of his breathing, pt states he cannot lay on his L side because he can't catch his breath.    Relevant past medical, surgical, family and social history reviewed and updated as indicated. Interim medical history since our last visit reviewed. Allergies and medications reviewed and updated.   Current outpatient prescriptions:  .  albuterol (PROVENTIL HFA;VENTOLIN HFA) 108 (90 BASE) MCG/ACT inhaler, Inhale 2 puffs into the lungs every 6 (six) hours as needed for wheezing or shortness of breath., Disp: 3 Inhaler, Rfl: 3 .  dexlansoprazole (DEXILANT) 60 MG capsule, Take 1 capsule (60 mg total) by mouth daily., Disp: 30 capsule, Rfl: 0 .  fluticasone-salmeterol (ADVAIR HFA) 230-21 MCG/ACT inhaler, Inhale 2 puffs into the lungs 2 (two) times daily., Disp: 3 Inhaler, Rfl: 3 .  Naphazoline-Glycerin (CLEAR EYES REDNESS RELIEF OP), Apply 1 drop to eye daily as needed (red eyes)., Disp: , Rfl:   Review of Systems  Per HPI unless specifically indicated above     Objective:    BP 116/78 mmHg  Pulse 63  Temp(Src) 98.1 F (36.7 C)  Ht  (1.803 m)  Wt 156 lb 6.4 oz (70.943 kg)  BMI 21.82 kg/m2  SpO2 98%  Wt Readings from Last 3 Encounters:  08/01/15 156 lb 6.4 oz (70.943 kg)  07/11/15 157 lb (71.215 kg)  07/10/15 159 lb (72.122 kg)    Physical Exam  Constitutional: He is oriented to person, place, and time. He appears well-developed and  well-nourished.  HENT:  Head: Normocephalic and atraumatic.  Neck: Neck supple.  Cardiovascular: Normal rate and regular rhythm.   Pulmonary/Chest: Effort normal and breath sounds normal. He has no wheezes.  Abdominal: Soft. Bowel sounds are normal. There is no tenderness.  Musculoskeletal: He exhibits no edema.  Lymphadenopathy:    He has no cervical adenopathy.  Neurological: He is alert and oriented to person, place, and time.  Skin: Skin is warm and dry.  Psychiatric: He has a normal mood and affect. His behavior is normal.  Vitals reviewed.       Assessment & Plan:   Encounter Diagnosis  Name Primary?  . Chronic obstructive pulmonary disease, unspecified COPD type (HCC) Yes   Cont current meds  Follow up plan: Return in about 4 months (around 12/02/2015) for copd.   rto sooner prn

## 2015-08-17 ENCOUNTER — Other Ambulatory Visit: Payer: Self-pay

## 2015-08-17 ENCOUNTER — Encounter: Payer: Self-pay | Admitting: Gastroenterology

## 2015-08-17 ENCOUNTER — Ambulatory Visit (INDEPENDENT_AMBULATORY_CARE_PROVIDER_SITE_OTHER): Payer: Self-pay | Admitting: Gastroenterology

## 2015-08-17 VITALS — BP 132/75 | HR 81 | Temp 98.2°F | Ht 72.0 in | Wt 155.4 lb

## 2015-08-17 DIAGNOSIS — R932 Abnormal findings on diagnostic imaging of liver and biliary tract: Secondary | ICD-10-CM | POA: Insufficient documentation

## 2015-08-17 DIAGNOSIS — R1310 Dysphagia, unspecified: Secondary | ICD-10-CM

## 2015-08-17 DIAGNOSIS — R1013 Epigastric pain: Secondary | ICD-10-CM

## 2015-08-17 DIAGNOSIS — R1314 Dysphagia, pharyngoesophageal phase: Secondary | ICD-10-CM

## 2015-08-17 DIAGNOSIS — R1319 Other dysphagia: Secondary | ICD-10-CM | POA: Insufficient documentation

## 2015-08-17 DIAGNOSIS — B182 Chronic viral hepatitis C: Secondary | ICD-10-CM

## 2015-08-17 DIAGNOSIS — R110 Nausea: Secondary | ICD-10-CM

## 2015-08-17 NOTE — Progress Notes (Signed)
Primary Care Physician:  Jacquelin Hawking, PA-C  Primary Gastroenterologist:  Roetta Sessions, MD   Chief Complaint  Patient presents with  . EGD    HPI:  Edgar Roberts is a 54 y.o. male here to schedule his EGD. Recently delayed waiting for cardiac clearance. Stress test unremarkable recently. History of chronic hepatitis C virus seen years ago by [facility] in [place] but no treatment offered at that time due to etoh use. Recent LFTs normal. HCV RNA 1,610,960 IU/mL, genotype 1b. No etoh in 17 months now. Quit after he watched his dad died from cirrhosis.recent ultrasound with elastography showed adenomyomatosis of the gallbladder, tiny nonmobile non-shadowing echogenic foci in the gallbladder either adherent sludge balls or foci of adenomyomatosis. No gallstones seen. 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 Last LFTs normal.   Continues to have ongoing anorexia, daily nausea without vomiting. Complains of epigastric pain, worse with meals. Has to force himself to eat. Reports 20 pound weight loss over the past one year although it has been stable more recently. Denies any recent right upper quadrant pain. He has had some in the past. Bowel movements regular. No melena rectal bleeding. Does not want to pursue colonoscopy at this time. Continues to complain of fatigue. Also with dysphagia to solid foods.     Current Outpatient Prescriptions  Medication Sig Dispense Refill  . albuterol (PROVENTIL HFA;VENTOLIN HFA) 108 (90 BASE) MCG/ACT inhaler Inhale 2 puffs into the lungs every 6 (six) hours as needed for wheezing or shortness of breath. 3 Inhaler 3  . dexlansoprazole (DEXILANT) 60 MG capsule Take 1 capsule (60 mg total) by mouth daily. 30 capsule 0  . fluticasone-salmeterol (ADVAIR HFA) 230-21 MCG/ACT inhaler Inhale 2 puffs into the lungs 2 (two) times daily. 3 Inhaler 3  . Naphazoline-Glycerin (CLEAR EYES REDNESS RELIEF OP) Apply 1 drop to eye daily as needed (red eyes).     No current  facility-administered medications for this visit.    Allergies as of 08/17/2015  . (No Known Allergies)    Past Medical History  Diagnosis Date  . Chronic hepatitis C (HCC)   . COPD (chronic obstructive pulmonary disease) (HCC)   . Asthma   . GERD (gastroesophageal reflux disease)     Past Surgical History  Procedure Laterality Date  . None      Family History  Problem Relation Age of Onset  . Cirrhosis Father     deceased  . Colon cancer Neg Hx   . Heart disease Sister     3 open heart surgeries  . Heart disease Mother   . Heart disease Other     grandmother    Social History   Social History  . Marital Status: Single    Spouse Name: N/A  . Number of Children: N/A  . Years of Education: N/A   Occupational History  . Not on file.   Social History Main Topics  . Smoking status: Former Smoker -- 2.00 packs/day for 5 years    Types: Cigarettes    Start date: 06/06/1976    Quit date: 05/12/1983  . Smokeless tobacco: Never Used     Comment: Quit in 1984  . Alcohol Use: No     Comment: quit 01/2014  . Drug Use: Yes    Special: Marijuana     Comment: everyday  . Sexual Activity: Yes    Birth Control/ Protection: None   Other Topics Concern  . Not on file   Social History Narrative  ROS:  General: Negative for  fever, chills,  weakness. See hpi Eyes: Negative for vision changes.  ENT: Negative for hoarseness,   nasal congestion. CV: Negative for chest pain, angina, palpitations, dyspnea on exertion, peripheral edema.  Respiratory: Negative for dyspnea at rest, dyspnea on exertion, cough, sputum, wheezing.  GI: See history of present illness. GU:  Negative for dysuria, hematuria, urinary incontinence, urinary frequency, nocturnal urination.  MS: Negative for joint pain, low back pain.  Derm: Negative for rash or itching.  Neuro: Negative for weakness, abnormal sensation, seizure, frequent headaches, memory loss, confusion.  Psych: Negative for  anxiety, depression, suicidal ideation, hallucinations.  Endo: see hpi  Heme: Negative for bruising or bleeding. Allergy: Negative for rash or hives.    Physical Examination:  BP 132/75 mmHg  Pulse 81  Temp(Src) 98.2 F (36.8 C) (Oral)  Ht 6' (1.829 m)  Wt 155 lb 6.4 oz (70.489 kg)  BMI 21.07 kg/m2   General: Well-nourished, well-developed in no acute distress.  Head: Normocephalic, atraumatic.   Eyes: Conjunctiva pink, no icterus. Mouth: Oropharyngeal mucosa moist and pink , no lesions erythema or exudate. Neck: Supple without thyromegaly, masses, or lymphadenopathy.  Lungs: Clear to auscultation bilaterally.  Heart: Regular rate and rhythm, no murmurs rubs or gallops.  Abdomen: Bowel sounds are normal, mild epigastric tenderness, nondistended, no hepatosplenomegaly or masses, no abdominal bruits or    hernia , no rebound or guarding.   Rectal: not performed Extremities: No lower extremity edema. No clubbing or deformities.  Neuro: Alert and oriented x 4 , grossly normal neurologically.  Skin: Warm and dry, no rash or jaundice.   Psych: Alert and cooperative, normal mood and affect.  Labs: Lab Results  Component Value Date   WBC 5.8 07/10/2015   HGB 16.8 07/10/2015   HCT 47.4 07/10/2015   MCV 95.6 07/10/2015   PLT 206 07/10/2015   Lab Results  Component Value Date   CREATININE 0.68 07/10/2015   BUN 9 07/10/2015   NA 138 07/10/2015   K 4.1 07/10/2015   CL 105 07/10/2015   CO2 27 07/10/2015   Lab Results  Component Value Date   ALT 43 05/08/2015   AST 32 05/08/2015   ALKPHOS 55 05/08/2015   BILITOT 0.6 05/08/2015     Imaging Studies: No results found.

## 2015-08-17 NOTE — Assessment & Plan Note (Signed)
54 year old gentleman with history of chronic hepatitis C, genotype 1B, treatment nave who presents for chronic epigastric pain associated with nausea, anorexia, weight loss, remote upper GI bleeding never evaluated. No alcohol use in over 17 months. EGD has been delayed on multiple occasions, most recently because of need for cardiac clearance. He has subsequently underwent stress test which was unremarkable. Differential diagnosis includes gastritis, peptic ulcer disease, malignancy, biliary. He does have an abnormal appearing gallbladder on recent ultrasound will likely need to have cholecystectomy at some point in the future.  Plan for EGD plus or minus esophageal dilation (history of dysphagia) with deep sedation in the OR given history of alcohol abuse in the past.  I have discussed the risks, alternatives, benefits with regards to but not limited to the risk of reaction to medication, bleeding, infection, perforation and the patient is agreeable to proceed. Written consent to be obtained.  Continue PPI therapy.  After upper endoscopy, will consider surgical evaluation of his gallbladder and continue to pursue hepatitis C treatment.

## 2015-08-17 NOTE — Progress Notes (Signed)
cc'ed to pcp °

## 2015-08-17 NOTE — Patient Instructions (Signed)
   Upper endoscopy. See separate instructions.   We will work on Hepatitis C treatment once upper endoscopy results are back.   Hepatitis C Hepatitis C is a viral infection of the liver. It can lead to scarring of the liver (cirrhosis), liver failure, or liver cancer. Hepatitis C may go undetected for months or years because people with the infection may not have symptoms, or they may have only mild symptoms. CAUSES  Hepatitis C is caused by the hepatitis C virus (HCV). The virus can be passed from one person to another through:  Blood.  Contaminated needles, such as those used for tattooing, body piercing, acupuncture, or injecting drugs.  Having unprotected sex with an infected person.  Childbirth.  Blood transfusions or organ transplants done in the [place] before 1992. RISK FACTORS Risk factors for hepatitis C include:  Having unprotected sex with an infected person.  Using illegal drugs. SIGNS AND SYMPTOMS  Symptoms of hepatitis C may include:  Fatigue.  Loss of appetite.  Nausea.  Vomiting.  Abdominal pain.  Dark yellow urine.  Yellowish skin and eyes (jaundice).  Itching of the skin.  Clay-colored bowel movements.  Joint pain. Symptoms are not always present.  DIAGNOSIS  Hepatitis C is diagnosed with blood tests. Other types of tests may also be done to check how your liver is functioning. TREATMENT  Your health care provider may perform noninvasive tests or a liver biopsy to help determine the best course of treatment. Treatment for hepatitis C may include one or more medicines. Your health care provider may check you for a recurring infection or other liver conditions every 6-12 months after treatment. HOME CARE INSTRUCTIONS   Rest as needed.  Take all medicines as directed by your health care provider.  Do not take any medicine unless approved by your health care provider. This includes over-the-counter medicine and birth control pills.  Do  not drink alcohol.  Do not have sex until approved by your health care provider.  Do not share toothbrushes, nail clippers, razors, or needles. PREVENTION There is no vaccine for hepatitis C. The only way to prevent the disease is to reduce the risk of exposure to the virus. This may be done by:  Practicing safe sex and using condoms.  Avoiding illegal drugs. SEEK MEDICAL CARE IF:  You have a fever.  You develop abdominal pain.  You develop dark urine.  You have clay-colored bowel movements.  You develop joint pains. SEEK IMMEDIATE MEDICAL CARE IF:  You have increasing fatigue or weakness.  You lose your appetite.  You feel nauseous or vomit.  You develop jaundice or your jaundice gets worse.  You bruise or bleed easily. MAKE SURE YOU:   Understand these instructions.  Will watch your condition.  Will get help right away if you are not doing well or get worse.   This information is not intended to replace advice given to you by your health care provider. Make sure you discuss any questions you have with your health care provider.   Document Released: 10/11/2000 Document Revised: 11/04/2014 Document Reviewed: 01/26/2014 Elsevier Interactive Patient Education [employer]2016 Elsevier Inc.

## 2015-08-29 NOTE — Patient Instructions (Signed)
Edgar Roberts  08/29/2015     @   Your procedure is scheduled on 09/04/2015.  Report to [facility] at 6:45 A.M.  Call this number if you have problems the morning of surgery:  [phone]   Remember:  Do not eat food or drink liquids after midnight.  Take these medicines the morning of surgery with A SIP OF WATER Dexilant, Albuterol inhaler and bring with you   Do not wear jewelry, make-up or nail polish.  Do not wear lotions, powders, or perfumes.  You may wear deodorant.  Do not shave 48 hours prior to surgery.  Men may shave face and neck.  Do not bring valuables to the hospital.  [facility] is not responsible for any belongings or valuables.  Contacts, dentures or bridgework may not be worn into surgery.  Leave your suitcase in the car.  After surgery it may be brought to your room.  For patients admitted to the hospital, discharge time will be determined by your treatment team.  Patients discharged the day of surgery will not be allowed to drive home.    Please read over the following fact sheets that you were given. Anesthesia Post-op Instructions     PATIENT INSTRUCTIONS POST-ANESTHESIA  IMMEDIATELY FOLLOWING SURGERY:  Do not drive or operate machinery for the first twenty four hours after surgery.  Do not make any important decisions for twenty four hours after surgery or while taking narcotic pain medications or sedatives.  If you develop intractable nausea and vomiting or a severe headache please notify your doctor immediately.  FOLLOW-UP:  Please make an appointment with your surgeon as instructed. You do not need to follow up with anesthesia unless specifically instructed to do so.  WOUND CARE INSTRUCTIONS (if applicable):  Keep a dry clean dressing on the anesthesia/puncture wound site if there is drainage.  Once the wound has quit draining you may leave it open to air.  Generally you should leave the bandage intact for twenty four hours  unless there is drainage.  If the epidural site drains for more than 36-48 hours please call the anesthesia department.  QUESTIONS?:  Please feel free to call your physician or the hospital operator if you have any questions, and they will be happy to assist you.      Esophageal Dilatation Esophageal dilatation is a procedure to open a blocked or narrowed part of the esophagus. The esophagus is the long tube in your throat that carries food and liquid from your mouth to your stomach. The procedure is also called esophageal dilation.  You may need this procedure if you have a buildup of scar tissue in your esophagus that makes it difficult, painful, or even impossible to swallow. This can be caused by gastroesophageal reflux disease (GERD). In rare cases, people need this procedure because they have cancer of the esophagus or a problem with the way food moves through the esophagus. Sometimes you may need to have another dilatation to enlarge the opening of the esophagus gradually. LET [facility] CARE PROVIDER KNOW ABOUT:   Any allergies you have.  All medicines you are taking, including vitamins, herbs, eye drops, creams, and over-the-counter medicines.  Previous problems you or members of your family have had with the use of anesthetics.  Any blood disorders you have.  Previous surgeries you have had.  Medical conditions you have.  Any antibiotic medicines you are required to take before dental procedures. RISKS AND COMPLICATIONS Generally, this is a safe  procedure. However, problems can occur and include:  Bleeding from a tear in the lining of the esophagus.  A hole (perforation) in the esophagus. BEFORE THE PROCEDURE  Do not eat or drink anything after midnight on the night before the procedure or as directed by your health care provider.  Ask your health care provider about changing or stopping your regular medicines. This is especially important if you are taking diabetes  medicines or blood thinners.  Plan to have someone take you home after the procedure. PROCEDURE   You will be given a medicine that makes you relaxed and sleepy (sedative).  A medicine may be sprayed or gargled to numb the back of the throat.  Your health care provider can use various instruments to do an esophageal dilatation. During the procedure, the instrument used will be placed in your mouth and passed down into your esophagus. Options include:  Simple dilators. This instrument is carefully placed in the esophagus to stretch it.  Guided wire bougies. In this method, a flexible tube (endoscope) is used to insert a wire into the esophagus. The dilator is passed over this wire to enlarge the esophagus. Then the wire is removed.  Balloon dilators. An endoscope with a small balloon at the end is passed down into the esophagus. Inflating the balloon gently stretches the esophagus and opens it up. AFTER THE PROCEDURE  Your blood pressure, heart rate, breathing rate, and blood oxygen level will be monitored often until the medicines you were given have worn off.  Your throat may feel slightly sore and will probably still feel numb. This will improve slowly over time.  You will not be allowed to eat or drink until the throat numbness has resolved.  If this is a same-day procedure, you may be allowed to go home once you have been able to drink, urinate, and sit on the edge of the bed without nausea or dizziness.  If this is a same-day procedure, you should have a friend or family member with you for the next 24 hours after the procedure.   This information is not intended to replace advice given to you by your health care provider. Make sure you discuss any questions you have with your health care provider.   Document Released: 12/05/2005 Document Revised: 11/04/2014 Document Reviewed: 02/23/2014 Elsevier Interactive Patient Education 2016 [employer]. Esophagogastroduodenoscopy Esophagogastroduodenoscopy (EGD) is a procedure that is used to examine the lining of the esophagus, stomach, and first part of the small intestine (duodenum). A long, flexible, lighted tube with a camera attached (endoscope) is inserted down the throat to view these organs. This procedure is done to detect problems or abnormalities, such as inflammation, bleeding, ulcers, or growths, in order to treat them. The procedure lasts 5-20 minutes. It is usually an outpatient procedure, but it may need to be performed in a hospital in emergency cases. LET [facility] CARE PROVIDER KNOW ABOUT:  Any allergies you have.  All medicines you are taking, including vitamins, herbs, eye drops, creams, and over-the-counter medicines.  Previous problems you or members of your family have had with the use of anesthetics.  Any blood disorders you have.  Previous surgeries you have had.  Medical conditions you have. RISKS AND COMPLICATIONS Generally, this is a safe procedure. However, problems can occur and include:  Infection.  Bleeding.  Tearing (perforation) of the esophagus, stomach, or duodenum.  Difficulty breathing or not being able to breathe.  Excessive sweating.  Spasms of the larynx.  Slowed heartbeat.  Low blood pressure. BEFORE THE PROCEDURE  Do not eat or drink anything after midnight on the night before the procedure or as directed by your health care provider.  Do not take your regular medicines before the procedure if your health care provider asks you not to. Ask your health care provider about changing or stopping those medicines.  If you wear dentures, be prepared to remove them before the procedure.  Arrange for someone to drive you home after the procedure. PROCEDURE  A numbing medicine (local anesthetic) may be sprayed in your throat for comfort and to stop you from gagging or coughing.  You will have an IV tube inserted in a vein in  your hand or arm. You will receive medicines and fluids through this tube.  You will be given a medicine to relax you (sedative).  A pain reliever will be given through the IV tube.  A mouth guard may be placed in your mouth to protect your teeth and to keep you from biting on the endoscope.  You will be asked to lie on your left side.  The endoscope will be inserted down your throat and into your esophagus, stomach, and duodenum.  Air will be put through the endoscope to allow your health care provider to clearly view the lining of your esophagus.  The lining of your esophagus, stomach, and duodenum will be examined. During the exam, your health care provider may:  Remove tissue to be examined under a microscope (biopsy) for inflammation, infection, or other medical problems.  Remove growths.  Remove objects (foreign bodies) that are stuck.  Treat any bleeding with medicines or other devices that stop tissues from bleeding (hot cautery, clipping devices).  Widen (dilate) or stretch narrowed areas of your esophagus and stomach.  The endoscope will be withdrawn. AFTER THE PROCEDURE  You will be taken to a recovery area for observation. Your blood pressure, heart rate, breathing rate, and blood oxygen level will be monitored often until the medicines you were given have worn off.  Do not eat or drink anything until the numbing medicine has worn off and your gag reflex has returned. You may choke.  Your health care provider should be able to discuss his or her findings with you. It will take longer to discuss the test results if any biopsies were taken.   This information is not intended to replace advice given to you by your health care provider. Make sure you discuss any questions you have with your health care provider.   Document Released: 02/14/2005 Document Revised: 11/04/2014 Document Reviewed: 09/16/2012 Elsevier Interactive Patient Education [employer]2016 Elsevier Inc.

## 2015-08-30 ENCOUNTER — Encounter (HOSPITAL_COMMUNITY): Payer: Self-pay

## 2015-08-30 ENCOUNTER — Encounter (HOSPITAL_COMMUNITY)
Admission: RE | Admit: 2015-08-30 | Discharge: 2015-08-30 | Disposition: A | Discharge status: Home / Self Care | Payer: Self-pay | Source: Ambulatory Visit | Attending: Internal Medicine | Admitting: Internal Medicine

## 2015-08-30 DIAGNOSIS — Z01818 Encounter for other preprocedural examination: Secondary | ICD-10-CM | POA: Insufficient documentation

## 2015-08-30 DIAGNOSIS — R1013 Epigastric pain: Secondary | ICD-10-CM | POA: Insufficient documentation

## 2015-08-30 DIAGNOSIS — R1310 Dysphagia, unspecified: Secondary | ICD-10-CM | POA: Insufficient documentation

## 2015-08-30 DIAGNOSIS — R110 Nausea: Secondary | ICD-10-CM | POA: Insufficient documentation

## 2015-08-30 LAB — CBC
HCT: 45.4 % (ref 39.0–52.0)
Hemoglobin: 15.8 g/dL (ref 13.0–17.0)
MCH: 33.3 pg (ref 26.0–34.0)
MCHC: 34.8 g/dL (ref 30.0–36.0)
MCV: 95.6 fL (ref 78.0–100.0)
PLATELETS: 207 10*3/uL (ref 150–400)
RBC: 4.75 MIL/uL (ref 4.22–5.81)
RDW: 13.3 % (ref 11.5–15.5)
WBC: 6.9 10*3/uL (ref 4.0–10.5)

## 2015-08-30 LAB — BASIC METABOLIC PANEL
Anion gap: 8 (ref 5–15)
BUN: 8 mg/dL (ref 6–20)
CALCIUM: 9.5 mg/dL (ref 8.9–10.3)
CO2: 27 mmol/L (ref 22–32)
CREATININE: 0.85 mg/dL (ref 0.61–1.24)
Chloride: 103 mmol/L (ref 101–111)
GFR calc Af Amer: >60 mL/min (ref >60)
GLUCOSE: 119 mg/dL — AB (ref 65–99)
Potassium: 3.9 mmol/L (ref 3.5–5.1)
SODIUM: 138 mmol/L (ref 135–145)

## 2015-09-04 ENCOUNTER — Ambulatory Visit (HOSPITAL_COMMUNITY)
Admission: RE | Admit: 2015-09-04 | Discharge: 2015-09-04 | Disposition: A | Discharge status: Home / Self Care | Payer: Self-pay | Source: Ambulatory Visit | Attending: Internal Medicine | Admitting: Internal Medicine

## 2015-09-04 ENCOUNTER — Ambulatory Visit (HOSPITAL_COMMUNITY): Payer: Self-pay | Admitting: Anesthesiology

## 2015-09-04 ENCOUNTER — Encounter (HOSPITAL_COMMUNITY)
Admission: RE | Disposition: A | Discharge status: Home / Self Care | Payer: Self-pay | Source: Ambulatory Visit | Attending: Internal Medicine

## 2015-09-04 ENCOUNTER — Encounter (HOSPITAL_COMMUNITY): Payer: Self-pay | Admitting: *Deleted

## 2015-09-04 DIAGNOSIS — J449 Chronic obstructive pulmonary disease, unspecified: Secondary | ICD-10-CM | POA: Insufficient documentation

## 2015-09-04 DIAGNOSIS — Z87891 Personal history of nicotine dependence: Secondary | ICD-10-CM | POA: Insufficient documentation

## 2015-09-04 DIAGNOSIS — B9681 Helicobacter pylori [H. pylori] as the cause of diseases classified elsewhere: Secondary | ICD-10-CM | POA: Insufficient documentation

## 2015-09-04 DIAGNOSIS — K3189 Other diseases of stomach and duodenum: Secondary | ICD-10-CM | POA: Insufficient documentation

## 2015-09-04 DIAGNOSIS — R1013 Epigastric pain: Secondary | ICD-10-CM | POA: Insufficient documentation

## 2015-09-04 DIAGNOSIS — K2210 Ulcer of esophagus without bleeding: Secondary | ICD-10-CM | POA: Insufficient documentation

## 2015-09-04 DIAGNOSIS — K449 Diaphragmatic hernia without obstruction or gangrene: Secondary | ICD-10-CM | POA: Insufficient documentation

## 2015-09-04 DIAGNOSIS — K2100 Gastro-esophageal reflux disease with esophagitis, without bleeding: Secondary | ICD-10-CM | POA: Insufficient documentation

## 2015-09-04 DIAGNOSIS — R1310 Dysphagia, unspecified: Secondary | ICD-10-CM | POA: Insufficient documentation

## 2015-09-04 DIAGNOSIS — K219 Gastro-esophageal reflux disease without esophagitis: Secondary | ICD-10-CM | POA: Insufficient documentation

## 2015-09-04 DIAGNOSIS — K2960 Other gastritis without bleeding: Secondary | ICD-10-CM | POA: Insufficient documentation

## 2015-09-04 HISTORY — PX: ESOPHAGEAL DILATION: SHX303

## 2015-09-04 HISTORY — PX: ESOPHAGOGASTRODUODENOSCOPY (EGD) WITH PROPOFOL: SHX5813

## 2015-09-04 HISTORY — PX: BIOPSY: SHX5522

## 2015-09-04 SURGERY — ESOPHAGOGASTRODUODENOSCOPY (EGD) WITH PROPOFOL
Anesthesia: Monitor Anesthesia Care

## 2015-09-04 MED ORDER — ONDANSETRON HCL 4 MG/2ML IJ SOLN
INTRAMUSCULAR | Status: AC
  Filled 2015-09-04: qty 2

## 2015-09-04 MED ORDER — MIDAZOLAM HCL 2 MG/2ML IJ SOLN
INTRAMUSCULAR | Status: AC
  Filled 2015-09-04: qty 4

## 2015-09-04 MED ORDER — MIDAZOLAM HCL 2 MG/2ML IJ SOLN
INTRAMUSCULAR | Status: AC
  Filled 2015-09-04: qty 2

## 2015-09-04 MED ORDER — FENTANYL CITRATE (PF) 100 MCG/2ML IJ SOLN
25.0000 ug | INTRAMUSCULAR | Status: AC
  Administered 2015-09-04 (×2): 25 ug via INTRAVENOUS

## 2015-09-04 MED ORDER — MIDAZOLAM HCL 5 MG/5ML IJ SOLN
INTRAMUSCULAR | Status: DC | PRN
  Administered 2015-09-04: 2 mg via INTRAVENOUS

## 2015-09-04 MED ORDER — ONDANSETRON HCL 4 MG/2ML IJ SOLN: 4.0000 mg | Freq: Once | INTRAMUSCULAR | Status: DC | PRN

## 2015-09-04 MED ORDER — ONDANSETRON HCL 4 MG/2ML IJ SOLN
4.0000 mg | Freq: Once | INTRAMUSCULAR | Status: AC
  Administered 2015-09-04: 4 mg via INTRAVENOUS

## 2015-09-04 MED ORDER — LIDOCAINE VISCOUS 2 % MT SOLN
3.0000 mL | Freq: Once | OROMUCOSAL | Status: AC
  Administered 2015-09-04: 3 mL via OROMUCOSAL
  Filled 2015-09-04: qty 15

## 2015-09-04 MED ORDER — PROPOFOL 500 MG/50ML IV EMUL
INTRAVENOUS | Status: DC | PRN
  Administered 2015-09-04: 125 ug/kg/min via INTRAVENOUS

## 2015-09-04 MED ORDER — GLYCOPYRROLATE 0.2 MG/ML IJ SOLN
0.2000 mg | Freq: Once | INTRAMUSCULAR | Status: AC
  Administered 2015-09-04: 0.2 mg via INTRAVENOUS

## 2015-09-04 MED ORDER — STERILE WATER FOR IRRIGATION IR SOLN
Status: DC | PRN
  Administered 2015-09-04: 1000 mL

## 2015-09-04 MED ORDER — LIDOCAINE HCL (CARDIAC) 10 MG/ML IV SOLN
INTRAVENOUS | Status: DC | PRN
  Administered 2015-09-04: 50 mg via INTRAVENOUS

## 2015-09-04 MED ORDER — FENTANYL CITRATE (PF) 100 MCG/2ML IJ SOLN: 25.0000 ug | INTRAMUSCULAR | Status: DC | PRN

## 2015-09-04 MED ORDER — FENTANYL CITRATE (PF) 100 MCG/2ML IJ SOLN
INTRAMUSCULAR | Status: AC
  Filled 2015-09-04: qty 2

## 2015-09-04 MED ORDER — GLYCOPYRROLATE 0.2 MG/ML IJ SOLN
INTRAMUSCULAR | Status: AC
  Filled 2015-09-04: qty 1

## 2015-09-04 MED ORDER — MIDAZOLAM HCL 2 MG/2ML IJ SOLN
1.0000 mg | INTRAMUSCULAR | Status: DC | PRN
  Administered 2015-09-04 (×2): 2 mg via INTRAVENOUS
  Filled 2015-09-04: qty 2

## 2015-09-04 MED ORDER — LACTATED RINGERS IV SOLN
INTRAVENOUS | Status: DC
  Administered 2015-09-04: 08:00:00 via INTRAVENOUS

## 2015-09-04 MED ORDER — LIDOCAINE VISCOUS 2 % MT SOLN
3.0000 mL | Freq: Once | OROMUCOSAL | Status: AC
  Administered 2015-09-04: 3 mL via OROMUCOSAL

## 2015-09-04 SURGICAL SUPPLY — 29 items
BALLN CRE LF 10-12 240X5.5 (BALLOONS)
BALLN CRE LF 10-12MM 240X5.5 (BALLOONS)
BALLN DILATOR CRE 12-15 240 (BALLOONS)
BALLN DILATOR CRE 15-18 240 (BALLOONS) IMPLANT
BALLN DILATOR CRE 18-20 240 (BALLOONS) IMPLANT
BALLN DILATOR CRE WIREGUIDE (BALLOONS)
BALLOON CRE LF 10-12 240X5.5 (BALLOONS) IMPLANT
BALLOON DILATOR CRE 12-15 240 (BALLOONS) IMPLANT
BALLOON DILATOR CRE WIREGUIDE (BALLOONS) IMPLANT
BLOCK BITE 60FR ADLT L/F BLUE (MISCELLANEOUS) ×3 IMPLANT
DEVICE CLIP HEMOSTAT 235CM (CLIP) IMPLANT
ELECT REM PT RETURN 9FT ADLT (ELECTROSURGICAL)
ELECTRODE REM PT RTRN 9FT ADLT (ELECTROSURGICAL) IMPLANT
FLOOR PAD 36X40 (MISCELLANEOUS) ×3
FORCEPS BIOP RAD 4 LRG CAP 4 (CUTTING FORCEPS) ×3 IMPLANT
FORMALIN 10 PREFIL 20ML (MISCELLANEOUS) ×3 IMPLANT
KIT ENDO PROCEDURE PEN (KITS) ×3 IMPLANT
MANIFOLD NEPTUNE II (INSTRUMENTS) ×3 IMPLANT
NEEDLE SCLEROTHERAPY 25GX240 (NEEDLE) IMPLANT
PAD FLOOR 36X40 (MISCELLANEOUS) ×1 IMPLANT
PROBE APC STR FIRE (PROBE) IMPLANT
PROBE INJECTION GOLD (MISCELLANEOUS)
PROBE INJECTION GOLD 7FR (MISCELLANEOUS) IMPLANT
SNARE ROTATE MED OVAL 20MM (MISCELLANEOUS) IMPLANT
SNARE SHORT THROW 13M SML OVAL (MISCELLANEOUS) IMPLANT
SYR INFLATE BILIARY GAUGE (MISCELLANEOUS) IMPLANT
SYR INFLATION 60ML (SYRINGE) IMPLANT
TUBING IRRIGATION ENDOGATOR (MISCELLANEOUS) ×3 IMPLANT
WATER STERILE IRR 1000ML POUR (IV SOLUTION) ×3 IMPLANT

## 2015-09-04 NOTE — Transfer of Care (Signed)
Immediate Anesthesia Transfer of Care Note  Patient: Edgar Roberts  Procedure(s) Performed: Procedure(s) with comments: ESOPHAGOGASTRODUODENOSCOPY (EGD) WITH PROPOFOL (N/A) - 0815  ESOPHAGEAL DILATION (N/A) - maloney 56 BIOPSY (N/A) - gastric  Patient Location: PACU  Anesthesia Type:MAC  Level of Consciousness: awake, alert , oriented and patient cooperative  Airway & Oxygen Therapy: Patient Spontanous Breathing and Patient connected to face mask oxygen  Post-op Assessment: Report given to RN and Post -op Vital signs reviewed and stable  Post vital signs: Reviewed and stable  Last Vitals:  Filed Vitals:   09/04/15 0810  BP: 116/81  Pulse:   Temp:   Resp: 16    Complications: No apparent anesthesia complications

## 2015-09-04 NOTE — H&P (View-Only) (Signed)
Primary Care Physician:  Shannon McElroy, PA-C  Primary Gastroenterologist:  Michael Rourk, MD   Chief Complaint  Patient presents with  . EGD    HPI:  Edgar Roberts is a 54 y.o. male here to schedule his EGD. Recently delayed waiting for cardiac clearance. Stress test unremarkable recently. History of chronic hepatitis C virus seen years ago by [facility]ology Clinic in [place] but no treatment offered at that time due to etoh use. Recent LFTs normal. HCV RNA 3,390,742 IU/mL, genotype 1b. No etoh in 17 months now. Quit after he watched his dad died from cirrhosis.recent ultrasound with elastography showed adenomyomatosis of the gallbladder, tiny nonmobile non-shadowing echogenic foci in the gallbladder either adherent sludge balls or foci of adenomyomatosis. No gallstones seen. 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 Last LFTs normal.   Continues to have ongoing anorexia, daily nausea without vomiting. Complains of epigastric pain, worse with meals. Has to force himself to eat. Reports 20 pound weight loss over the past one year although it has been stable more recently. Denies any recent right upper quadrant pain. He has had some in the past. Bowel movements regular. No melena rectal bleeding. Does not want to pursue colonoscopy at this time. Continues to complain of fatigue. Also with dysphagia to solid foods.     Current Outpatient Prescriptions  Medication Sig Dispense Refill  . albuterol (PROVENTIL HFA;VENTOLIN HFA) 108 (90 BASE) MCG/ACT inhaler Inhale 2 puffs into the lungs every 6 (six) hours as needed for wheezing or shortness of breath. 3 Inhaler 3  . dexlansoprazole (DEXILANT) 60 MG capsule Take 1 capsule (60 mg total) by mouth daily. 30 capsule 0  . fluticasone-salmeterol (ADVAIR HFA) 230-21 MCG/ACT inhaler Inhale 2 puffs into the lungs 2 (two) times daily. 3 Inhaler 3  . Naphazoline-Glycerin (CLEAR EYES REDNESS RELIEF OP) Apply 1 drop to eye daily as needed (red eyes).     No current  facility-administered medications for this visit.    Allergies as of 08/17/2015  . (No Known Allergies)    Past Medical History  Diagnosis Date  . Chronic hepatitis C (HCC)   . COPD (chronic obstructive pulmonary disease) (HCC)   . Asthma   . GERD (gastroesophageal reflux disease)     Past Surgical History  Procedure Laterality Date  . None      Family History  Problem Relation Age of Onset  . Cirrhosis Father     deceased  . Colon cancer Neg Hx   . Heart disease Sister     3 open heart surgeries  . Heart disease Mother   . Heart disease Other     grandmother    Social History   Social History  . Marital Status: Single    Spouse Name: N/A  . Number of Children: N/A  . Years of Education: N/A   Occupational History  . Not on file.   Social History Main Topics  . Smoking status: Former Smoker -- 2.00 packs/day for 5 years    Types: Cigarettes    Start date: 06/06/1976    Quit date: 05/12/1983  . Smokeless tobacco: Never Used     Comment: Quit in 1984  . Alcohol Use: No     Comment: quit 01/2014  . Drug Use: Yes    Special: Marijuana     Comment: everyday  . Sexual Activity: Yes    Birth Control/ Protection: None   Other Topics Concern  . Not on file   Social History Narrative        ROS:  General: Negative for  fever, chills,  weakness. See hpi Eyes: Negative for vision changes.  ENT: Negative for hoarseness,   nasal congestion. CV: Negative for chest pain, angina, palpitations, dyspnea on exertion, peripheral edema.  Respiratory: Negative for dyspnea at rest, dyspnea on exertion, cough, sputum, wheezing.  GI: See history of present illness. GU:  Negative for dysuria, hematuria, urinary incontinence, urinary frequency, nocturnal urination.  MS: Negative for joint pain, low back pain.  Derm: Negative for rash or itching.  Neuro: Negative for weakness, abnormal sensation, seizure, frequent headaches, memory loss, confusion.  Psych: Negative for  anxiety, depression, suicidal ideation, hallucinations.  Endo: see hpi  Heme: Negative for bruising or bleeding. Allergy: Negative for rash or hives.    Physical Examination:  BP 132/75 mmHg  Pulse 81  Temp(Src) 98.2 F (36.8 C) (Oral)  Ht 6' (1.829 m)  Wt 155 lb 6.4 oz (70.489 kg)  BMI 21.07 kg/m2   General: Well-nourished, well-developed in no acute distress.  Head: Normocephalic, atraumatic.   Eyes: Conjunctiva pink, no icterus. Mouth: Oropharyngeal mucosa moist and pink , no lesions erythema or exudate. Neck: Supple without thyromegaly, masses, or lymphadenopathy.  Lungs: Clear to auscultation bilaterally.  Heart: Regular rate and rhythm, no murmurs rubs or gallops.  Abdomen: Bowel sounds are normal, mild epigastric tenderness, nondistended, no hepatosplenomegaly or masses, no abdominal bruits or    hernia , no rebound or guarding.   Rectal: not performed Extremities: No lower extremity edema. No clubbing or deformities.  Neuro: Alert and oriented x 4 , grossly normal neurologically.  Skin: Warm and dry, no rash or jaundice.   Psych: Alert and cooperative, normal mood and affect.  Labs: Lab Results  Component Value Date   WBC 5.8 07/10/2015   HGB 16.8 07/10/2015   HCT 47.4 07/10/2015   MCV 95.6 07/10/2015   PLT 206 07/10/2015   Lab Results  Component Value Date   CREATININE 0.68 07/10/2015   BUN 9 07/10/2015   NA 138 07/10/2015   K 4.1 07/10/2015   CL 105 07/10/2015   CO2 27 07/10/2015   Lab Results  Component Value Date   ALT 43 05/08/2015   AST 32 05/08/2015   ALKPHOS 55 05/08/2015   BILITOT 0.6 05/08/2015     Imaging Studies: No results found.

## 2015-09-04 NOTE — Anesthesia Preprocedure Evaluation (Signed)
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 use and marijuana use, Hepatitis -, C  Endo/Other    Renal/GU      Musculoskeletal   Abdominal   Peds  Hematology   Anesthesia Other Findings   Reproductive/Obstetrics                             Anesthesia Physical Anesthesia Plan  ASA: III  Anesthesia Plan: MAC   Post-op Pain Management:    Induction: Intravenous  Airway Management Planned: Simple Face Mask  Additional Equipment:   Intra-op Plan:   Post-operative Plan:   Informed Consent: I have reviewed the patients History and Physical, chart, labs and discussed the procedure including the risks, benefits and alternatives for the proposed anesthesia with the patient or authorized representative who has indicated his/her understanding and acceptance.     Plan Discussed with:   Anesthesia Plan Comments:         Anesthesia Quick Evaluation

## 2015-09-04 NOTE — Anesthesia Postprocedure Evaluation (Signed)
  Anesthesia Post-op Note  Patient: Edgar Roberts  Procedure(s) Performed: Procedure(s) with comments: ESOPHAGOGASTRODUODENOSCOPY (EGD) WITH PROPOFOL (N/A) - 0815  ESOPHAGEAL DILATION (N/A) - maloney 56 BIOPSY (N/A) - gastric  Patient Location: PACU  Anesthesia Type:MAC  Level of Consciousness: awake, alert , oriented and patient cooperative  Airway and Oxygen Therapy: Patient Spontanous Breathing and Patient connected to face mask oxygen  Post-op Pain: none  Post-op Assessment: Post-op Vital signs reviewed, Patient's Cardiovascular Status Stable, Respiratory Function Stable, Patent Airway, No signs of Nausea or vomiting and Pain level controlled              Post-op Vital Signs: Reviewed and stable  Last Vitals:  Filed Vitals:   09/04/15 0810  BP: 116/81  Pulse:   Temp:   Resp: 16    Complications: No apparent anesthesia complications

## 2015-09-04 NOTE — Discharge Instructions (Signed)
EGD Discharge instructions Please read the instructions outlined below and refer to this sheet in the next few weeks. These discharge instructions provide you with general information on caring for yourself after you leave the hospital. Your doctor may also give you specific instructions. While your treatment has been planned according to the most current medical practices available, unavoidable complications occasionally occur. If you have any problems or questions after discharge, please call your doctor. ACTIVITY  You may resume your regular activity but move at a slower pace for the next 24 hours.   Take frequent rest periods for the next 24 hours.   Walking will help expel (get rid of) the air and reduce the bloated feeling in your abdomen.   No driving for 24 hours (because of the anesthesia (medicine) used during the test).   You may shower.   Do not sign any important legal documents or operate any machinery for 24 hours (because of the anesthesia used during the test).  NUTRITION  Drink plenty of fluids.   You may resume your normal diet.   Begin with a light meal and progress to your normal diet.   Avoid alcoholic beverages for 24 hours or as instructed by your caregiver.  MEDICATIONS  You may resume your normal medications unless your caregiver tells you otherwise.  WHAT YOU CAN EXPECT TODAY  You may experience abdominal discomfort such as a feeling of fullness or gas pains.  FOLLOW-UP  Your doctor will discuss the results of your test with you.  SEEK IMMEDIATE MEDICAL ATTENTION IF ANY OF THE FOLLOWING OCCUR:  Excessive nausea (feeling sick to your stomach) and/or vomiting.   Severe abdominal pain and distention (swelling).   Trouble swallowing.   Temperature over 101 F (37.8 C).   Rectal bleeding or vomiting of blood.    GERD information provided  Continue Dexilant 60 mg daily  Further recommendations to follow pending review of pathology report  I  anticipate a surgical consultation in the near future for removal of your gallbladder and liver biopsy  Gastroesophageal Reflux Disease, Adult Normally, food travels down the esophagus and stays in the stomach to be digested. However, when a person has gastroesophageal reflux disease (GERD), food and stomach acid move back up into the esophagus. When this happens, the esophagus becomes sore and inflamed. Over time, GERD can create small holes (ulcers) in the lining of the esophagus.  CAUSES This condition is caused by a problem with the muscle between the esophagus and the stomach (lower esophageal sphincter, or LES). Normally, the LES muscle closes after food passes through the esophagus to the stomach. When the LES is weakened or abnormal, it does not close properly, and that allows food and stomach acid to go back up into the esophagus. The LES can be weakened by certain dietary substances, medicines, and medical conditions, including:  Tobacco use.  Pregnancy.  Having a hiatal hernia.  Heavy alcohol use.  Certain foods and beverages, such as coffee, chocolate, onions, and peppermint. RISK FACTORS This condition is more likely to develop in:  People who have an increased body weight.  People who have connective tissue disorders.  People who use NSAID medicines. SYMPTOMS Symptoms of this condition include:  Heartburn.  Difficult or painful swallowing.  The feeling of having a lump in the throat.  Abitter taste in the mouth.  Bad breath.  Having a large amount of saliva.  Having an upset or bloated stomach.  Belching.  Chest pain.  Shortness of  breath or wheezing.  Ongoing (chronic) cough or a night-time cough.  Wearing away of tooth enamel.  Weight loss. Different conditions can cause chest pain. Make sure to see your health care provider if you experience chest pain. DIAGNOSIS Your health care provider will take a medical history and perform a physical exam.  To determine if you have mild or severe GERD, your health care provider may also monitor how you respond to treatment. You may also have other tests, including:  An endoscopy toexamine your stomach and esophagus with a small camera.  A test thatmeasures the acidity level in your esophagus.  A test thatmeasures how much pressure is on your esophagus.  A barium swallow or modified barium swallow to show the shape, size, and functioning of your esophagus. TREATMENT The goal of treatment is to help relieve your symptoms and to prevent complications. Treatment for this condition may vary depending on how severe your symptoms are. Your health care provider may recommend:  Changes to your diet.  Medicine.  Surgery. HOME CARE INSTRUCTIONS Diet  Follow a diet as recommended by your health care provider. This may involve avoiding foods and drinks such as:  Coffee and tea (with or without caffeine).  Drinks that containalcohol.  Energy drinks and sports drinks.  Carbonated drinks or sodas.  Chocolate and cocoa.  Peppermint and mint flavorings.  Garlic and onions.  Horseradish.  Spicy and acidic foods, including peppers, chili powder, curry powder, vinegar, hot sauces, and barbecue sauce.  Citrus fruit juices and citrus fruits, such as oranges, lemons, and limes.  Tomato-based foods, such as red sauce, chili, salsa, and pizza with red sauce.  Fried and fatty foods, such as donuts, french fries, potato chips, and high-fat dressings.  High-fat meats, such as hot dogs and fatty cuts of red and white meats, such as rib eye steak, sausage, ham, and bacon.  High-fat dairy items, such as whole milk, butter, and cream cheese.  Eat small, frequent meals instead of large meals.  Avoid drinking large amounts of liquid with your meals.  Avoid eating meals during the 2-3 hours before bedtime.  Avoid lying down right after you eat.  Do not exercise right after you eat. General  Instructions  Pay attention to any changes in your symptoms.  Take over-the-counter and prescription medicines only as told by your health care provider. Do not take aspirin, ibuprofen, or other NSAIDs unless your health care provider told you to do so.  Do not use any tobacco products, including cigarettes, chewing tobacco, and e-cigarettes. If you need help quitting, ask your health care provider.  Wear loose-fitting clothing. Do not wear anything tight around your waist that causes pressure on your abdomen.  Raise (elevate) the head of your bed 6 inches (15cm).  Try to reduce your stress, such as with yoga or meditation. If you need help reducing stress, ask your health care provider.  If you are overweight, reduce your weight to an amount that is healthy for you. Ask your health care provider for guidance about a safe weight loss goal.  Keep all follow-up visits as told by your health care provider. This is important. SEEK MEDICAL CARE IF:  You have new symptoms.  You have unexplained weight loss.  You have difficulty swallowing, or it hurts to swallow.  You have wheezing or a persistent cough.  Your symptoms do not improve with treatment.  You have a hoarse voice. SEEK IMMEDIATE MEDICAL CARE IF:  You have pain in your  arms, neck, jaw, teeth, or back.  You feel sweaty, dizzy, or light-headed.  You have chest pain or shortness of breath.  You vomit and your vomit looks like blood or coffee grounds.  You faint.  Your stool is bloody or black.  You cannot swallow, drink, or eat.   This information is not intended to replace advice given to you by your health care provider. Make sure you discuss any questions you have with your health care provider.   Document Released: 07/24/2005 Document Revised: 07/05/2015 Document Reviewed: 02/08/2015 Elsevier Interactive Patient Education [employer]

## 2015-09-04 NOTE — Op Note (Signed)
[facility] [address] [place], [id number]   ENDOSCOPY PROCEDURE REPORT  PATIENT: Edgar Roberts, Taksh  MR#: [id number] BIRTHDATE: [birth year] , 54  yrs. old GENDER: male ENDOSCOPIST: R.  Roetta SessionsMichael Corby Villasenor, MD FACP [facility] REFERRED BY:  Jacquelin HawkingShannon McElroy, PA-C PROCEDURE DATE:  09/04/2015 PROCEDURE:  EGD w/ biopsy and Maloney dilation of esophagus INDICATIONS:  Epigastric pain and esophageal dysphagia. MEDICATIONS: Deep sedation per [facility] A[facility] CLASS:      Class III  CONSENT: The risks, benefits, limitations, alternatives and imponderables have been discussed.  The potential for biopsy, esophogeal dilation, etc. have also been reviewed.  Questions have been answered.  All parties agreeable.  Please see the history and physical in the medical record for more information.  DESCRIPTION OF PROCEDURE: After the risks benefits and alternatives of the procedure were thoroughly explained, informed consent was obtained.  The    endoscope was introduced through the mouth and advanced to the second portion of the duodenum , limited by Without limitations. The instrument was slowly withdrawn as the mucosa was fully examined. Estimated blood loss is zero unless otherwise noted in this procedure report.    Circumferential distal esophageal erosions within 5 mm of the GE junction.  No esophageal varices.  No Barrett's esophagus.  Tubular esophagus appeared patent throughout its course.  Stomach empty.  2 cm hiatal hernia.  Diffuse scarlatiniform appearance of the gastric mucosa with patchy erythema.  No ulcer or infiltrating process. Mucosa did not appear consistent with portal gastropathy.  Patent pylorus.  Normal appearing first and second portion of the duodenum.   Scope was withdrawn and a 56 [place] Maloney dilator was passed to fully insertion easily.  A look back revealed no apparent complication related to this maneuver.  Subsequently, biopsies abnormal gastric  mucosa taken.  Retroflexed views revealed as previously described.     The scope was then withdrawn from the patient and the procedure completed.  COMPLICATIONS: There were no immediate complications. EBL 2 mL ENDOSCOPIC IMPRESSION: Mild erosive reflux esophagitis?"status post passage of a Maloney dilator. Hiatal hernia. Abnormal stomach?"status post gastric biopsy  RECOMMENDATIONS: Follow-up on pathology.  Continue Dexilant 60 mg daily. Anticipate referral for cholecystectomy and liver biopsy in the near future.  REPEAT EXAM:  eSigned:  R. Roetta SessionsMichael Luan Maberry, MD Jerrel IvoryFACP [facility] 09/04/2015 8:47 AM    CC:  CPT CODES: ICD CODES:  The ICD and CPT codes recommended by this software are interpretations from the data that the clinical staff has captured with the software.  The verification of the translation of this report to the ICD and CPT codes and modifiers is the sole responsibility of the health care institution and practicing physician where this report was generated.  [employer] will not be held responsible for the validity of the ICD and CPT codes included on this report.  AMA assumes no liability for data contained or not contained herein. CPT is a Publishing rights managerregistered trademark of the [employer].  PATIENT NAME:  Edgar Roberts, Halsey MR#: [id number]

## 2015-09-04 NOTE — Interval H&P Note (Signed)
History and Physical Interval Note:  09/04/2015 7:27 AM  Edgar Roberts  has presented today for surgery, with the diagnosis of dysphagia, epigastric pain, nausea  The various methods of treatment have been discussed with the patient and family. After consideration of risks, benefits and other options for treatment, the patient has consented to  Procedure(s) with comments: ESOPHAGOGASTRODUODENOSCOPY (EGD) WITH PROPOFOL (N/A) - 0815 ESOPHAGEAL DILATION (N/A) as a surgical intervention .  The patient's history has been reviewed, patient examined, no change in status, stable for surgery.  I have reviewed the patient's chart and labs.  Questions were answered to the patient's satisfaction.      No change.  EGD w ED.per plan.  The risks, benefits, limitations, alternatives and imponderables have been reviewed with the patient. Potential for esophageal dilation, biopsy, etc. have also been reviewed.  Questions have been answered. All parties agreeable.   Edgar Roberts

## 2015-09-05 ENCOUNTER — Encounter (HOSPITAL_COMMUNITY): Payer: Self-pay | Admitting: Internal Medicine

## 2015-09-08 ENCOUNTER — Encounter: Payer: Self-pay | Admitting: Internal Medicine

## 2015-09-11 ENCOUNTER — Telehealth: Payer: Self-pay

## 2015-09-11 ENCOUNTER — Other Ambulatory Visit: Payer: Self-pay

## 2015-09-11 DIAGNOSIS — K7460 Unspecified cirrhosis of liver: Secondary | ICD-10-CM

## 2015-09-11 DIAGNOSIS — Z719 Counseling, unspecified: Secondary | ICD-10-CM

## 2015-09-11 NOTE — Telephone Encounter (Signed)
Pt is aware of results. He does not have any insurance, pt is not currently taking any medications, except inhalers. He is not allergic to anything. Per AS- ok to give him pylera sample box. Sample box of pylera and prilosec, with instructions are at the front desk for the pt to pick up. He said he would come tomorrow.

## 2015-09-11 NOTE — Telephone Encounter (Signed)
Agree 

## 2015-09-11 NOTE — Telephone Encounter (Signed)
Per RMR- Send letter to patient.  Send copy of letter with path to referring provider and PCP.  Patient needs PrevPak or generic equivalent x 14 days--hold any acid suppression and/or statin therapy patient may be taking during treatment - then resume.  Patient should be getting an appointment to see Dr. Lovell SheehanJenkins for cholecystectomy and liver biopsy.

## 2015-09-11 NOTE — Telephone Encounter (Signed)
Liver biopsy ordered.  Ref[facility] due to [facility]

## 2015-11-03 ENCOUNTER — Other Ambulatory Visit: Payer: Self-pay | Admitting: Radiology

## 2015-11-06 ENCOUNTER — Encounter (HOSPITAL_COMMUNITY): Payer: Self-pay

## 2015-11-06 ENCOUNTER — Ambulatory Visit (HOSPITAL_COMMUNITY)
Admission: RE | Admit: 2015-11-06 | Discharge: 2015-11-06 | Disposition: A | Discharge status: Home / Self Care | Payer: Self-pay | Source: Ambulatory Visit | Attending: Internal Medicine | Admitting: Internal Medicine

## 2015-11-06 ENCOUNTER — Telehealth: Payer: Self-pay | Admitting: Gastroenterology

## 2015-11-06 DIAGNOSIS — K219 Gastro-esophageal reflux disease without esophagitis: Secondary | ICD-10-CM | POA: Insufficient documentation

## 2015-11-06 DIAGNOSIS — Z79899 Other long term (current) drug therapy: Secondary | ICD-10-CM | POA: Insufficient documentation

## 2015-11-06 DIAGNOSIS — K7460 Unspecified cirrhosis of liver: Secondary | ICD-10-CM | POA: Insufficient documentation

## 2015-11-06 DIAGNOSIS — B1920 Unspecified viral hepatitis C without hepatic coma: Secondary | ICD-10-CM | POA: Insufficient documentation

## 2015-11-06 DIAGNOSIS — J45909 Unspecified asthma, uncomplicated: Secondary | ICD-10-CM | POA: Insufficient documentation

## 2015-11-06 DIAGNOSIS — Z87891 Personal history of nicotine dependence: Secondary | ICD-10-CM | POA: Insufficient documentation

## 2015-11-06 DIAGNOSIS — J449 Chronic obstructive pulmonary disease, unspecified: Secondary | ICD-10-CM | POA: Insufficient documentation

## 2015-11-06 MED ORDER — SODIUM CHLORIDE 0.9 % IV SOLN
Freq: Once | INTRAVENOUS | Status: AC
  Administered 2015-11-06: 11:00:00 via INTRAVENOUS

## 2015-11-06 NOTE — Telephone Encounter (Signed)
Letter sent.

## 2015-11-06 NOTE — Telephone Encounter (Signed)
Pt called and i gave [facility] the [facility]

## 2015-11-06 NOTE — Telephone Encounter (Signed)
Plan was for liver biopsy at the time of cholecystectomy.

## 2015-11-06 NOTE — H&P (Signed)
Chief Complaint: Patient was seen in consultation today for random liver core biopsy at the request of Rourk,Robert M  Referring Physician(s): Rourk,Robert M  History of Present Illness: Edgar Roberts is a 55 y.o. male   Pt has been aware of Hepatitis C and Cirrhosis "many Yrs" Has not had alcohol in over yr now Nausea; wt loss Recent EGD; with dilitation Request for liver core biopsy  Past Medical History  Diagnosis Date  . Chronic hepatitis C (HCC)   . COPD (chronic obstructive pulmonary disease) (HCC)   . Asthma   . GERD (gastroesophageal reflux disease)     Past Surgical History  Procedure Laterality Date  . None    . Esophagogastroduodenoscopy (egd) with propofol N/A 09/04/2015    Procedure: ESOPHAGOGASTRODUODENOSCOPY (EGD) WITH PROPOFOL;  Surgeon: Corbin Ade, MD;  Location: [facility];  Service: Endoscopy;  Laterality: N/A;  1610  . Esophageal dilation N/A 09/04/2015    Procedure:  ESOPHAGEAL DILATION;  Surgeon: Corbin Ade, MD;  Location: [facility];  Service: Endoscopy;  Laterality: N/A;  maloney 56  . Esophageal biopsy N/A 09/04/2015    Procedure: BIOPSY;  Surgeon: Corbin Ade, MD;  Location: [facility];  Service: Endoscopy;  Laterality: N/A;  gastric    Allergies: Review of patient's allergies indicates no known allergies.  Medications: Prior to Admission medications   Medication Sig Start Date End Date Taking? Authorizing Provider  albuterol (PROVENTIL HFA;VENTOLIN HFA) 108 (90 BASE) MCG/ACT inhaler Inhale 2 puffs into the lungs every 6 (six) hours as needed for wheezing or shortness of breath. 08/01/15  Yes Jacquelin Hawking, PA-C  dexlansoprazole (DEXILANT) 60 MG capsule Take 1 capsule (60 mg total) by mouth daily. 06/19/15  Yes Tiffany Kocher, PA-C  fluticasone-salmeterol (ADVAIR HFA) 230-21 MCG/ACT inhaler Inhale 2 puffs into the lungs 2 (two) times daily. 08/01/15  Yes Jacquelin Hawking, PA-C  Naphazoline-Glycerin (CLEAR EYES REDNESS RELIEF OP) Apply 1 drop to  eye daily as needed (red eyes).    Historical Provider, MD     Family History  Problem Relation Age of Onset  . Cirrhosis Father     deceased  . Colon cancer Neg Hx   . Heart disease Sister     3 open heart surgeries  . Heart disease Mother   . Heart disease Other     grandmother    Social History   Social History  . Marital Status: Single    Spouse Name: N/A  . Number of Children: N/A  . Years of Education: N/A   Social History Main Topics  . Smoking status: Former Smoker -- 2.00 packs/day for 5 years    Types: Cigarettes    Start date: 06/06/1976    Quit date: 05/12/1983  . Smokeless tobacco: Never Used     Comment: Quit in 1984  . Alcohol Use: No     Comment: quit 01/2014  . Drug Use: Yes    Special: Marijuana     Comment: everyday  . Sexual Activity: Yes    Birth Control/ Protection: None   Other Topics Concern  . None   Social History Narrative     Review of Systems: A 12 point ROS discussed and pertinent positives are indicated in the HPI above.  All other systems are negative.  Review of Systems  Constitutional: Positive for appetite change, fatigue and unexpected weight change. Negative for fever and activity change.  HENT: Positive for trouble swallowing.   Respiratory: Negative for cough and shortness  of breath.   Gastrointestinal: Positive for nausea and abdominal pain.  Neurological: Positive for weakness.  Psychiatric/Behavioral: Negative for behavioral problems and confusion.    Vital Signs: BP 139/95 mmHg  Pulse 77  Temp(Src) 97.7 F (36.5 C) (Oral)  Resp 18  Ht 6' (1.829 m)  Wt 154 lb (69.854 kg)  BMI 20.88 kg/m2  SpO2 100%  Physical Exam  Constitutional: He is oriented to person, place, and time.  Cardiovascular: Normal rate, regular rhythm and normal heart sounds.   Pulmonary/Chest: Effort normal and breath sounds normal. He has no wheezes.  Abdominal: Soft. Bowel sounds are normal. There is no tenderness.  Musculoskeletal:  Normal range of motion.  Neurological: He is alert and oriented to person, place, and time.  Skin: Skin is warm and dry.  Psychiatric: He has a normal mood and affect. His behavior is normal. Judgment and thought content normal.  Nursing note and vitals reviewed.   Mallampati Score:  MD Evaluation Airway: WNL Heart: WNL Abdomen: WNL Chest/ Lungs: WNL ASA  Classification: 2 Mallampati/Airway Score: Two  Imaging: No results found.  Labs:  CBC:  Recent Labs  05/08/15 1205 07/10/15 1000 08/30/15 1300  WBC 5.6 5.8 6.9  HGB 16.4 16.8 15.8  HCT 46.7 47.4 45.4  PLT 192 206 207    COAGS:  Recent Labs  05/08/15 1205  INR 1.00    BMP:  Recent Labs  05/08/15 1205 07/10/15 1000 08/30/15 1300  NA 142 138 138  K 4.2 4.1 3.9  CL 101 105 103  CO2 28 27 27   GLUCOSE 72 101* 119*  BUN 8 9 8   CALCIUM 10.1 9.4 9.5  CREATININE 0.76 0.68 0.85  GFRNONAA  --  >60 >60  GFRAA  --  >60 >60    LIVER FUNCTION TESTS:  Recent Labs  05/08/15 1205  BILITOT 0.6  AST 32  ALT 43  ALKPHOS 55  PROT 7.3  ALBUMIN 4.5    TUMOR MARKERS: No results for input(s): AFPTM, CEA, CA199, CHROMGRNA in the last 8760 hours.  Assessment and Plan:  Hep C Cirrhosis Now scheduled for liver core biopsy Risks and Benefits discussed with the patient including, but not limited to bleeding, infection, damage to adjacent structures or low yield requiring additional tests. All of the patient's questions were answered, patient is agreeable to proceed. Consent signed and in chart.   Thank you for this interesting consult.  I greatly enjoyed meeting Edgar Roberts and look forward to participating in their care.  A copy of this report was sent to the requesting provider on this date.  Signed: Nekhi Liwanag A 11/06/2015, 11:17 AM   I spent a total of  30 Minutes   in face to face in clinical consultation, greater than 50% of which was counseling/coordinating care for liver core  bx

## 2015-11-06 NOTE — Telephone Encounter (Signed)
I tried to call the patient to speak with hi, in regards to the scheduling conflict, however I was unable to reach him by phone.

## 2015-11-06 NOTE — Telephone Encounter (Signed)
Spoke with IR scheduling and procedure has been cancelled.  Tried calling pt unable to leave message called x 5

## 2015-11-06 NOTE — Telephone Encounter (Signed)
Staff from [facility] called to confirm cancellation of procedure. States that pt was already there.  Told IR Staff to tell pt sorry for any inconvenience  and to contact our office regarding this procedure and follow up

## 2015-11-06 NOTE — Telephone Encounter (Signed)
Candy, can we send the patient a letter in regards to [facility], please?

## 2015-11-06 NOTE — Telephone Encounter (Signed)
Dr. Jena Gaussourk called today because he received H+P from interventional radiology regarding patient having upcoming U/S guided liver biopsy.  Per Dr. Jena Gaussourk, he ordered for liver biopsy at time of cholecystectomy not through IR.  He requested we cancel liver biopsy via IR.  I spoke to Glad[place]andy. She will try to get in touch with patient. Patient is scheduled for today at 1pm. She will inform IR of cancelled order.  [facility], they tried to reach him via phone without success.   Please let patient know [facility] to get appointment for gallbladder.

## 2015-12-04 ENCOUNTER — Encounter: Payer: Self-pay | Admitting: Physician Assistant

## 2015-12-04 ENCOUNTER — Ambulatory Visit: Payer: Self-pay | Admitting: Physician Assistant

## 2015-12-04 VITALS — BP 104/70 | HR 83 | Temp 98.1°F | Ht 72.0 in | Wt 147.5 lb

## 2015-12-04 DIAGNOSIS — Z125 Encounter for screening for malignant neoplasm of prostate: Secondary | ICD-10-CM

## 2015-12-04 DIAGNOSIS — J449 Chronic obstructive pulmonary disease, unspecified: Secondary | ICD-10-CM | POA: Insufficient documentation

## 2015-12-04 DIAGNOSIS — Z13220 Encounter for screening for lipoid disorders: Secondary | ICD-10-CM

## 2015-12-04 DIAGNOSIS — B1920 Unspecified viral hepatitis C without hepatic coma: Secondary | ICD-10-CM

## 2015-12-04 MED ORDER — ALBUTEROL SULFATE HFA 108 (90 BASE) MCG/ACT IN AERS: 2.0000 | INHALATION_SPRAY | Freq: Four times a day (QID) | RESPIRATORY_TRACT | Status: DC | PRN

## 2015-12-04 MED ORDER — FLUTICASONE-SALMETEROL 230-21 MCG/ACT IN AERO: 2.0000 | INHALATION_SPRAY | Freq: Two times a day (BID) | RESPIRATORY_TRACT | Status: DC

## 2015-12-04 NOTE — Progress Notes (Signed)
BP 104/70 mmHg  Pulse 83  Temp(Src) 98.1 F (36.7 C)  Ht 6' (1.829 m)  Wt 147 lb 8 oz (66.906 kg)  BMI 20.00 kg/m2  SpO2 98%   Subjective:    Patient ID: Edgar Roberts, male    DOB: Sep 17, 1961, 55 y.o.   MRN: [id number]  HPI: Edgar Roberts is a 55 y.o. male presenting on 12/04/2015 for COPD   HPI Chief Complaint  Patient presents with  . COPD    pt states he is doing alright   Pt is continuing with GI for evaluation and treatment of hepatitis.  Pt states he is still abstaining from etoh! Pt states no cp.  Only occasionally has episode of sob.  Relevant past medical, surgical, family and social history reviewed and updated as indicated. Interim medical history since our last visit reviewed. Allergies and medications reviewed and updated.   Current outpatient prescriptions:  .  albuterol (PROVENTIL HFA;VENTOLIN HFA) 108 (90 BASE) MCG/ACT inhaler, Inhale 2 puffs into the lungs every 6 (six) hours as needed for wheezing or shortness of breath., Disp: 3 Inhaler, Rfl: 3 .  fluticasone-salmeterol (ADVAIR HFA) 230-21 MCG/ACT inhaler, Inhale 2 puffs into the lungs 2 (two) times daily., Disp: 3 Inhaler, Rfl: 3 .  Naphazoline-Glycerin (CLEAR EYES REDNESS RELIEF OP), Apply 1 drop to eye daily as needed (red eyes)., Disp: , Rfl:  .  dexlansoprazole (DEXILANT) 60 MG capsule, Take 1 capsule (60 mg total) by mouth daily. (Patient not taking: Reported on 12/04/2015), Disp: 30 capsule, Rfl: 0   Review of Systems  Constitutional: Negative for fever, chills, diaphoresis, appetite change, fatigue and unexpected weight change.  HENT: Positive for dental problem. Negative for congestion, drooling, ear pain, facial swelling, hearing loss, mouth sores, sneezing, sore throat, trouble swallowing and voice change.   Eyes: Positive for redness. Negative for pain, discharge, itching and visual disturbance.  Respiratory: Negative for cough, choking, shortness of breath and wheezing.   Cardiovascular:  Negative for chest pain, palpitations and leg swelling.  Gastrointestinal: Negative for vomiting, abdominal pain, diarrhea, constipation and blood in stool.  Endocrine: Negative for cold intolerance, heat intolerance and polydipsia.  Genitourinary: Negative for dysuria, hematuria and decreased urine volume.  Musculoskeletal: Positive for back pain. Negative for arthralgias and gait problem.  Skin: Negative for rash.  Allergic/Immunologic: Negative for environmental allergies.  Neurological: Negative for seizures, syncope, light-headedness and headaches.  Hematological: Negative for adenopathy.  Psychiatric/Behavioral: Negative for suicidal ideas, dysphoric mood and agitation. The patient is not nervous/anxious.     Per HPI unless specifically indicated above     Objective:    BP 104/70 mmHg  Pulse 83  Temp(Src) 98.1 F (36.7 C)  Ht 6' (1.829 m)  Wt 147 lb 8 oz (66.906 kg)  BMI 20.00 kg/m2  SpO2 98%  Wt Readings from Last 3 Encounters:  12/04/15 147 lb 8 oz (66.906 kg)  11/06/15 154 lb (69.854 kg)  09/04/15 154 lb (69.854 kg)    Physical Exam  Constitutional: He is oriented to person, place, and time. He appears well-developed and well-nourished.  HENT:  Head: Normocephalic and atraumatic.  Neck: Neck supple.  Cardiovascular: Normal rate and regular rhythm.   Pulmonary/Chest: Effort normal and breath sounds normal. He has no wheezes.  Abdominal: Soft. Bowel sounds are normal. There is no hepatosplenomegaly. There is no tenderness.  Musculoskeletal: He exhibits no edema.  Lymphadenopathy:    He has no cervical adenopathy.  Neurological: He is alert and oriented to person, place, and  time.  Skin: Skin is warm and dry.  Psychiatric: He has a normal mood and affect. His behavior is normal.  Vitals reviewed.       Assessment & Plan:    Encounter Diagnoses  Name Primary?  . Chronic obstructive pulmonary disease, unspecified COPD type (HCC) Yes  . Screening cholesterol  level   . Screening for prostate cancer   . Hepatitis C virus infection without hepatic coma, unspecified chronicity      -Draw fasting labs tomorrow morning. - will call with results -continue current meds.  Will reorder inhalers from [facility] -F/u 6 months.  RTO sooner prn

## 2015-12-05 LAB — COMPLETE METABOLIC PANEL WITH GFR
ALT: 45 U/L (ref 9–46)
AST: 46 U/L — ABNORMAL HIGH (ref 10–35)
Albumin: 4.4 g/dL (ref 3.6–5.1)
Alkaline Phosphatase: 52 U/L (ref 40–115)
BILIRUBIN TOTAL: 0.9 mg/dL (ref 0.2–1.2)
BUN: 7 mg/dL (ref 7–25)
CO2: 29 mmol/L (ref 20–31)
CREATININE: 0.85 mg/dL (ref 0.70–1.33)
Calcium: 9.7 mg/dL (ref 8.6–10.3)
Chloride: 103 mmol/L (ref 98–110)
GFR, Est African American: >89 mL/min (ref >=60)
GFR, Est Non African American: >89 mL/min (ref >=60)
GLUCOSE: 103 mg/dL — AB (ref 65–99)
Potassium: 4.4 mmol/L (ref 3.5–5.3)
SODIUM: 140 mmol/L (ref 135–146)
TOTAL PROTEIN: 7.2 g/dL (ref 6.1–8.1)

## 2015-12-05 LAB — LIPID PANEL
Cholesterol: 131 mg/dL (ref 125–200)
HDL: 34 mg/dL — AB (ref >=40)
LDL CALC: 80 mg/dL (ref <130)
Total CHOL/HDL Ratio: 3.9 Ratio (ref <=5.0)
Triglycerides: 87 mg/dL (ref <150)
VLDL: 17 mg/dL (ref <30)

## 2015-12-06 LAB — PSA: PSA: 0.43 ng/mL (ref <=4.00)

## 2016-06-03 ENCOUNTER — Encounter: Payer: Self-pay | Admitting: Physician Assistant

## 2016-06-03 ENCOUNTER — Ambulatory Visit: Payer: Self-pay | Admitting: Physician Assistant

## 2016-06-03 VITALS — BP 108/62 | HR 80 | Temp 98.1°F | Ht 72.0 in | Wt 144.0 lb

## 2016-06-03 DIAGNOSIS — B182 Chronic viral hepatitis C: Secondary | ICD-10-CM

## 2016-06-03 DIAGNOSIS — J449 Chronic obstructive pulmonary disease, unspecified: Secondary | ICD-10-CM

## 2016-06-03 DIAGNOSIS — F17219 Nicotine dependence, cigarettes, with unspecified nicotine-induced disorders: Secondary | ICD-10-CM

## 2016-06-03 NOTE — Progress Notes (Signed)
BP 108/62 (BP Location: Left Arm, Patient Position: Sitting, Cuff Size: Normal)   Pulse 80   Temp 98.1 F (36.7 C) (Other (Comment))   Ht 6' (1.829 m)   Wt 144 lb (65.3 kg)   SpO2 97%   BMI 19.53 kg/m    Subjective:    Patient ID: Edgar Roberts, male    DOB: 11-07-1960, 55 y.o.   MRN: [id number]  HPI: Edgar Roberts is a 55 y.o. male presenting on 06/03/2016 for COPD   HPI   Pt is still avoiding alcohol.  He still has his gallbladder.  Apparently GI wanted pt to have liver biopsy when he was getting his cholecystectomy. Pt did not have the GB removed so he has not had liver biopsy.   Pt has not seen GI for appointment since his before his EGD.   Pt has not been treated for hepatitis yet- believe liver biopsy was desired prior to treatment  Pt is still smoking but has cut back.  He ran out of his inhalers- has been having problems getting refilled.  Relevant past medical, surgical, family and social history reviewed and updated as indicated. Interim medical history since our last visit reviewed. Allergies and medications reviewed and updated.  Current Outpatient Prescriptions:  .  Naphazoline-Glycerin (CLEAR EYES REDNESS RELIEF OP), Apply 1 drop to eye daily as needed (red eyes)., Disp: , Rfl:  .  albuterol (PROVENTIL HFA;VENTOLIN HFA) 108 (90 Base) MCG/ACT inhaler, Inhale 2 puffs into the lungs every 6 (six) hours as needed for wheezing or shortness of breath. (Patient not taking: Reported on 06/03/2016), Disp: 3 Inhaler, Rfl: 3 .  dexlansoprazole (DEXILANT) 60 MG capsule, Take 1 capsule (60 mg total) by mouth daily. (Patient not taking: Reported on 12/04/2015), Disp: 30 capsule, Rfl: 0 .  fluticasone-salmeterol (ADVAIR HFA) 230-21 MCG/ACT inhaler, Inhale 2 puffs into the lungs 2 (two) times daily. (Patient not taking: Reported on 06/03/2016), Disp: 3 Inhaler, Rfl: 3  Review of Systems  Constitutional: Negative for appetite change, chills, diaphoresis, fatigue, fever and unexpected  weight change.  HENT: Positive for dental problem. Negative for congestion, drooling, ear pain, facial swelling, hearing loss, mouth sores, sneezing, sore throat, trouble swallowing and voice change.   Eyes: Negative for pain, discharge, redness, itching and visual disturbance.  Respiratory: Negative for cough, choking, shortness of breath and wheezing.   Cardiovascular: Negative for chest pain, palpitations and leg swelling.  Gastrointestinal: Negative for abdominal pain, blood in stool, constipation, diarrhea and vomiting.  Endocrine: Negative for cold intolerance, heat intolerance and polydipsia.  Genitourinary: Negative for decreased urine volume, dysuria and hematuria.  Musculoskeletal: Negative for arthralgias, back pain and gait problem.  Skin: Negative for rash.  Allergic/Immunologic: Negative for environmental allergies.  Neurological: Positive for headaches. Negative for seizures, syncope and light-headedness.  Hematological: Negative for adenopathy.  Psychiatric/Behavioral: Negative for agitation, dysphoric mood and suicidal ideas. The patient is not nervous/anxious.     Per HPI unless specifically indicated above     Objective:    BP 108/62 (BP Location: Left Arm, Patient Position: Sitting, Cuff Size: Normal)   Pulse 80   Temp 98.1 F (36.7 C) (Other (Comment))   Ht 6' (1.829 m)   Wt 144 lb (65.3 kg)   SpO2 97%   BMI 19.53 kg/m   Wt Readings from Last 3 Encounters:  06/03/16 144 lb (65.3 kg)  12/04/15 147 lb 8 oz (66.9 kg)  11/06/15 154 lb (69.9 kg)    Physical Exam  Constitutional: He  is oriented to person, place, and time. He appears well-developed and well-nourished.  HENT:  Head: Normocephalic and atraumatic.  Neck: Neck supple.  Cardiovascular: Normal rate and regular rhythm.   Pulmonary/Chest: Effort normal and breath sounds normal. He has no wheezes.  Abdominal: Soft. Bowel sounds are normal. There is no hepatosplenomegaly. There is no tenderness.   Musculoskeletal: He exhibits no edema.  Lymphadenopathy:    He has no cervical adenopathy.  Neurological: He is alert and oriented to person, place, and time.  Skin: Skin is warm and dry.  Psychiatric: He has a normal mood and affect. His behavior is normal.  Vitals reviewed.       Assessment & Plan:   Encounter Diagnoses  Name Primary?  . Chronic obstructive pulmonary disease, unspecified COPD type (HCC) Yes  . Cigarette nicotine dependence with nicotine-induced disorder   . Chronic hepatitis C without hepatic coma (HCC)     -Will get pt Renewed with [facility] (pt assistance program) and order inhalers -Re-refer to GI for hepatitis -f/u  3months. RTO sooner prn

## 2016-06-04 ENCOUNTER — Other Ambulatory Visit: Payer: Self-pay | Admitting: Physician Assistant

## 2016-06-04 MED ORDER — ALBUTEROL SULFATE HFA 108 (90 BASE) MCG/ACT IN AERS: 2.0000 | INHALATION_SPRAY | Freq: Four times a day (QID) | RESPIRATORY_TRACT | 3 refills | Status: DC | PRN

## 2016-06-04 MED ORDER — FLUTICASONE-SALMETEROL 230-21 MCG/ACT IN AERO: 2.0000 | INHALATION_SPRAY | Freq: Two times a day (BID) | RESPIRATORY_TRACT | 3 refills | Status: DC

## 2016-06-06 ENCOUNTER — Encounter: Payer: Self-pay | Admitting: Internal Medicine

## 2016-06-06 ENCOUNTER — Telehealth: Payer: Self-pay | Admitting: Gastroenterology

## 2016-06-06 NOTE — Telephone Encounter (Signed)
Edgar Roberts, please schedule ov.  

## 2016-06-06 NOTE — Telephone Encounter (Signed)
APPT MADE AND LETTER SENT  °

## 2016-06-06 NOTE — Telephone Encounter (Signed)
Patient overdue for follow of hep c. Please schedule ov. He never had his gb removed and liver biopsy done.

## 2016-07-05 ENCOUNTER — Ambulatory Visit: Payer: Self-pay | Admitting: Gastroenterology

## 2016-07-29 ENCOUNTER — Ambulatory Visit: Payer: Self-pay

## 2016-07-29 ENCOUNTER — Ambulatory Visit: Payer: Self-pay | Admitting: Gastroenterology

## 2016-08-06 ENCOUNTER — Telehealth: Payer: Self-pay | Admitting: Gastroenterology

## 2016-08-06 ENCOUNTER — Encounter: Payer: Self-pay | Admitting: Gastroenterology

## 2016-08-06 ENCOUNTER — Ambulatory Visit: Payer: Self-pay | Admitting: Gastroenterology

## 2016-08-06 NOTE — Telephone Encounter (Signed)
PT WAS A NO SHOW AND LETTER SENT  °

## 2016-09-03 ENCOUNTER — Ambulatory Visit: Payer: Self-pay | Admitting: Physician Assistant

## 2016-09-03 ENCOUNTER — Encounter: Payer: Self-pay | Admitting: Physician Assistant

## 2016-09-03 VITALS — BP 100/64 | HR 73 | Temp 97.9°F | Ht 72.0 in | Wt 145.0 lb

## 2016-09-03 DIAGNOSIS — J449 Chronic obstructive pulmonary disease, unspecified: Secondary | ICD-10-CM

## 2016-09-03 DIAGNOSIS — K219 Gastro-esophageal reflux disease without esophagitis: Secondary | ICD-10-CM

## 2016-09-03 DIAGNOSIS — B182 Chronic viral hepatitis C: Secondary | ICD-10-CM

## 2016-09-03 NOTE — Patient Instructions (Signed)
RESCHEDULE YOUR GI APPOINTMENT

## 2016-09-03 NOTE — Progress Notes (Signed)
BP 100/64 (BP Location: Left Arm, Patient Position: Sitting, Cuff Size: Normal)   Pulse 73   Temp 97.9 F (36.6 C)   Ht 6' (1.829 m)   Wt 145 lb (65.8 kg)   SpO2 98%   BMI 19.67 kg/m    Subjective:    Patient ID: Edgar Roberts, male    DOB: [birth year], 55 y.o.   MRN: [id number]  HPI: Edgar Roberts is a 55 y.o. male presenting on 09/03/2016 for COPD   HPI   Pt missed his GI appointment  He is also taking some stomach medicine but he doesn't remember what it is called and didn't bring it with him.  He is still avoiding alchol and he doesn't smoke any longer.   Relevant past medical, surgical, family and social history reviewed and updated as indicated. Interim medical history since our last visit reviewed. Allergies and medications reviewed and updated.  Current Outpatient Prescriptions:  .  albuterol (PROVENTIL HFA;VENTOLIN HFA) 108 (90 Base) MCG/ACT inhaler, Inhale 2 puffs into the lungs every 6 (six) hours as needed for wheezing or shortness of breath., Disp: 3 Inhaler, Rfl: 3 .  fluticasone-salmeterol (ADVAIR HFA) 230-21 MCG/ACT inhaler, Inhale 2 puffs into the lungs 2 (two) times daily., Disp: 3 Inhaler, Rfl: 3 .  Naphazoline-Glycerin (CLEAR EYES REDNESS RELIEF OP), Apply 1 drop to eye daily as needed (red eyes)., Disp: , Rfl:  .  dexlansoprazole (DEXILANT) 60 MG capsule, Take 1 capsule (60 mg total) by mouth daily. (Patient not taking: Reported on 09/03/2016), Disp: 30 capsule, Rfl: 0   Review of Systems  Constitutional: Negative for appetite change, chills, diaphoresis, fatigue, fever and unexpected weight change.  HENT: Positive for congestion and dental problem. Negative for drooling, ear pain, facial swelling, hearing loss, mouth sores, sneezing, sore throat, trouble swallowing and voice change.   Eyes: Negative for pain, discharge, redness, itching and visual disturbance.  Respiratory: Positive for shortness of breath and wheezing. Negative for cough and choking.    Cardiovascular: Negative for chest pain, palpitations and leg swelling.  Gastrointestinal: Negative for abdominal pain, blood in stool, constipation, diarrhea and vomiting.  Endocrine: Negative for cold intolerance, heat intolerance and polydipsia.  Genitourinary: Negative for decreased urine volume, dysuria and hematuria.  Musculoskeletal: Negative for arthralgias, back pain and gait problem.  Skin: Negative for rash.  Allergic/Immunologic: Negative for environmental allergies.  Neurological: Positive for headaches. Negative for seizures, syncope and light-headedness.  Hematological: Negative for adenopathy.  Psychiatric/Behavioral: Negative for agitation, dysphoric mood and suicidal ideas. The patient is not nervous/anxious.     Per HPI unless specifically indicated above     Objective:    BP 100/64 (BP Location: Left Arm, Patient Position: Sitting, Cuff Size: Normal)   Pulse 73   Temp 97.9 F (36.6 C)   Ht 6' (1.829 m)   Wt 145 lb (65.8 kg)   SpO2 98%   BMI 19.67 kg/m   Wt Readings from Last 3 Encounters:  09/03/16 145 lb (65.8 kg)  06/03/16 144 lb (65.3 kg)  12/04/15 147 lb 8 oz (66.9 kg)    Physical Exam  Constitutional: He is oriented to person, place, and time. He appears well-developed and well-nourished.  HENT:  Head: Normocephalic and atraumatic.  Neck: Neck supple.  Cardiovascular: Normal rate and regular rhythm.   Pulmonary/Chest: Effort normal and breath sounds normal. He has no wheezes.  Abdominal: Soft. Bowel sounds are normal. There is no hepatosplenomegaly. There is no tenderness.  Musculoskeletal: He exhibits no edema.  Lymphadenopathy:    He has no cervical adenopathy.  Neurological: He is alert and oriented to person, place, and time.  Skin: Skin is warm and dry.  Psychiatric: He has a normal mood and affect. His behavior is normal.  Vitals reviewed.       Assessment & Plan:    Encounter Diagnoses  Name Primary?  . Chronic obstructive  pulmonary disease, unspecified COPD type (HCC) Yes  . Chronic hepatitis C without hepatic coma (HCC)   . Gastroesophageal reflux disease, esophagitis presence not specified     -urged pt to call to reschedule GI appointment. He says he will -follow up here in 4 months.  RTO sooner prn

## 2016-12-24 ENCOUNTER — Telehealth: Payer: Self-pay | Admitting: Cardiology

## 2016-12-24 NOTE — Telephone Encounter (Signed)
Numerous attempts to contact patient with recall letters. Unable to reach by telephone. with no success.    Albertine PatriciaStaci T Ashworth, CMA [[id number]][[id number]] 07/19/2015 10:11 AM New [10]   [System] 03/28/2016 11:01 PM Notification Sent [20]   Geraldine ContrasStephanie R Smith [[id number]][[id number]] 10/17/2016 10:23 AM Notification Sent [20]   Megan SalonVicky T Slaughter [[id number]][[id number]] 12/24/2016 1:54 PM Notification Sent [20]

## 2017-01-01 ENCOUNTER — Ambulatory Visit: Payer: Self-pay | Admitting: Physician Assistant

## 2017-01-20 ENCOUNTER — Encounter: Payer: Self-pay | Admitting: Physician Assistant

## 2017-01-23 ENCOUNTER — Ambulatory Visit: Payer: Self-pay | Admitting: Physician Assistant

## 2017-01-23 ENCOUNTER — Encounter: Payer: Self-pay | Admitting: Physician Assistant

## 2017-01-23 VITALS — BP 130/84 | HR 84 | Temp 98.1°F | Ht 72.0 in | Wt 146.0 lb

## 2017-01-23 DIAGNOSIS — Z125 Encounter for screening for malignant neoplasm of prostate: Secondary | ICD-10-CM

## 2017-01-23 DIAGNOSIS — F17219 Nicotine dependence, cigarettes, with unspecified nicotine-induced disorders: Secondary | ICD-10-CM

## 2017-01-23 DIAGNOSIS — B182 Chronic viral hepatitis C: Secondary | ICD-10-CM

## 2017-01-23 DIAGNOSIS — J449 Chronic obstructive pulmonary disease, unspecified: Secondary | ICD-10-CM

## 2017-01-23 DIAGNOSIS — K219 Gastro-esophageal reflux disease without esophagitis: Secondary | ICD-10-CM

## 2017-01-23 MED ORDER — ALBUTEROL SULFATE HFA 108 (90 BASE) MCG/ACT IN AERS: 2.0000 | INHALATION_SPRAY | Freq: Four times a day (QID) | RESPIRATORY_TRACT | 3 refills | Status: DC | PRN

## 2017-01-23 MED ORDER — FLUTICASONE-SALMETEROL 230-21 MCG/ACT IN AERO: 2.0000 | INHALATION_SPRAY | Freq: Two times a day (BID) | RESPIRATORY_TRACT | 3 refills | Status: DC

## 2017-01-23 NOTE — Progress Notes (Signed)
BP 130/84 (BP Location: Left Arm, Patient Position: Sitting, Cuff Size: Normal)   Pulse 84   Temp 98.1 F (36.7 C) (Other (Comment))   Ht 6' (1.829 m)   Wt 146 lb (66.2 kg)   SpO2 98%   BMI 19.80 kg/m    Subjective:    Patient ID: Edgar Roberts, male    DOB: 1961-07-18, 56 y.o.   MRN: [id number]  HPI: Edgar Roberts is a 56 y.o. male presenting on 01/23/2017 for COPD   HPI   Pt not drinking etoh.  He is smoking "a little bit"  Pt out of inhalers x 2 days.  He says he Couldn't figure out how to make call on his new phone.   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  Relevant past medical, surgical, family and social history reviewed and updated as indicated. Interim medical history since our last visit reviewed. Allergies and medications reviewed and updated.   Current Outpatient Prescriptions:  .  Naphazoline-Glycerin (CLEAR EYES REDNESS RELIEF OP), Apply 1 drop to eye daily as needed (red eyes)., Disp: , Rfl:  .  omeprazole (PRILOSEC) 10 MG capsule, Take 10 mg by mouth daily., Disp: , Rfl:  .  albuterol (PROVENTIL HFA;VENTOLIN HFA) 108 (90 Base) MCG/ACT inhaler, Inhale 2 puffs into the lungs every 6 (six) hours as needed for wheezing or shortness of breath. (Patient not taking: Reported on 01/23/2017), Disp: 3 Inhaler, Rfl: 3 .  fluticasone-salmeterol (ADVAIR HFA) 230-21 MCG/ACT inhaler, Inhale 2 puffs into the lungs 2 (two) times daily. (Patient not taking: Reported on 01/23/2017), Disp: 3 Inhaler, Rfl: 3   Review of Systems  Constitutional: Negative for appetite change, chills, diaphoresis, fatigue, fever and unexpected weight change.  HENT: Negative for congestion, drooling, ear pain, facial swelling, hearing loss, mouth sores, sneezing, sore throat, trouble swallowing and voice change.   Eyes: Negative for pain, discharge, redness, itching and visual disturbance.  Respiratory: Negative for cough, choking, shortness of breath and wheezing.   Cardiovascular: Negative for chest pain,  palpitations and leg swelling.  Gastrointestinal: Negative for abdominal pain, blood in stool, constipation, diarrhea and vomiting.  Endocrine: Negative for cold intolerance, heat intolerance and polydipsia.  Genitourinary: Negative for decreased urine volume, dysuria and hematuria.  Musculoskeletal: Negative for arthralgias, back pain and gait problem.  Skin: Negative for rash.  Allergic/Immunologic: Negative for environmental allergies.  Neurological: Negative for seizures, syncope, light-headedness and headaches.  Hematological: Negative for adenopathy.  Psychiatric/Behavioral: Negative for agitation, dysphoric mood and suicidal ideas. The patient is not nervous/anxious.     Per HPI unless specifically indicated above     Objective:    BP 130/84 (BP Location: Left Arm, Patient Position: Sitting, Cuff Size: Normal)   Pulse 84   Temp 98.1 F (36.7 C) (Other (Comment))   Ht 6' (1.829 m)   Wt 146 lb (66.2 kg)   SpO2 98%   BMI 19.80 kg/m   Wt Readings from Last 3 Encounters:  01/23/17 146 lb (66.2 kg)  09/03/16 145 lb (65.8 kg)  06/03/16 144 lb (65.3 kg)    Physical Exam  Constitutional: He is oriented to person, place, and time. He appears well-developed and well-nourished.  HENT:  Head: Normocephalic and atraumatic.  Neck: Neck supple.  Cardiovascular: Normal rate and regular rhythm.   Pulmonary/Chest: Effort normal and breath sounds normal. He has no wheezes.  Abdominal: Soft. Bowel sounds are normal. There is no hepatosplenomegaly. There is no tenderness.  Musculoskeletal: He exhibits no edema.  Lymphadenopathy:  He has no cervical adenopathy.  Neurological: He is alert and oriented to person, place, and time.  Skin: Skin is warm and dry.  Psychiatric: He has a normal mood and affect. His behavior is normal.  Vitals reviewed.        Assessment & Plan:   Encounter Diagnoses  Name Primary?  . Chronic obstructive pulmonary disease, unspecified COPD type (HCC)  Yes  . Chronic hepatitis C without hepatic coma (HCC)   . Cigarette nicotine dependence with nicotine-induced disorder   . Screening for prostate cancer   . Gastroesophageal reflux disease, esophagitis presence not specified     -pt to f/u with GI- counseled pt that he needs to call (for hepatitis) -Pt counseled that he needs to let us know if he is having problems with getting his refills, even it it is jut because he is having problems working his new phone   -follow up 3 months- check psa before appt -RTO sooner prn -Pt was given [facility] discount application so he can renew -counseled smoking cessation

## 2017-01-23 NOTE — Patient Instructions (Signed)
[facility]  [phone]

## 2017-02-14 ENCOUNTER — Ambulatory Visit (INDEPENDENT_AMBULATORY_CARE_PROVIDER_SITE_OTHER): Payer: Self-pay | Admitting: Gastroenterology

## 2017-02-14 ENCOUNTER — Encounter: Payer: Self-pay | Admitting: Gastroenterology

## 2017-02-14 VITALS — BP 122/76 | HR 74 | Temp 97.4°F | Ht 72.0 in | Wt 148.6 lb

## 2017-02-14 DIAGNOSIS — R1013 Epigastric pain: Secondary | ICD-10-CM

## 2017-02-14 DIAGNOSIS — R1011 Right upper quadrant pain: Secondary | ICD-10-CM | POA: Insufficient documentation

## 2017-02-14 DIAGNOSIS — R634 Abnormal weight loss: Secondary | ICD-10-CM

## 2017-02-14 DIAGNOSIS — B182 Chronic viral hepatitis C: Secondary | ICD-10-CM

## 2017-02-14 MED ORDER — ESOMEPRAZOLE MAGNESIUM 20 MG PO CPDR: 20.0000 mg | DELAYED_RELEASE_CAPSULE | Freq: Every day | ORAL | 0 refills | Status: DC

## 2017-02-14 NOTE — Progress Notes (Signed)
Primary Care Physician: Jacquelin Hawking, PA-C  Primary Gastroenterologist:  Roetta Sessions, MD   Chief Complaint  Patient presents with  . Abdominal Pain    right side  . Hepatitis C    HPI: Edgar Roberts is a 56 y.o. male here for follow up of chronic Hep C, genotype 1a, treatment naive and abd pain. Last seen in 07/2015. Patient states he never followed through with having his gallbladder removed and liver biopsy in 2016. He has missed several appointments stating secondary to his "phone not working". Patient reports no etoh in 2 years. 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 We had planned for cholecystectomy and liver biopsy at that time.   Complains of ruq pain, constant but worse with meals. Stomach pain with eating/drinknin even with first bite. Nausea, no vomiting. BM hard stool but daily. No brbpr or melena. Complains of heartburn. Has been off PPI for awhile. No dysphagia. No ASA/NSAIDS on regular basis.   10 pound weight loss.  Current Outpatient Prescriptions  Medication Sig Dispense Refill  . albuterol (PROVENTIL HFA;VENTOLIN HFA) 108 (90 Base) MCG/ACT inhaler Inhale 2 puffs into the lungs every 6 (six) hours as needed for wheezing or shortness of breath. 3 Inhaler 3  . fluticasone-salmeterol (ADVAIR HFA) 230-21 MCG/ACT inhaler Inhale 2 puffs into the lungs 2 (two) times daily. 3 Inhaler 3   No current facility-administered medications for this visit.     Allergies as of 02/14/2017  . (No Known Allergies)   Past Medical History:  Diagnosis Date  . Asthma   . Chronic hepatitis C (HCC)   . COPD (chronic obstructive pulmonary disease) (HCC)   . GERD (gastroesophageal reflux disease)    Past Surgical History:  Procedure Laterality Date  . BIOPSY N/A 09/04/2015   Procedure: BIOPSY;  Surgeon: Corbin Ade, MD;  Location: [facility];  Service: Endoscopy;  Laterality: N/A;  gastric  . ESOPHAGEAL  DILATION N/A 09/04/2015   Procedure:  ESOPHAGEAL DILATION;  Surgeon: Corbin Ade, MD;  Location: [facility];  Service: Endoscopy;  Laterality: N/A;  maloney 56  . ESOPHAGOGASTRODUODENOSCOPY (EGD) WITH PROPOFOL N/A 09/04/2015   Procedure: ESOPHAGOGASTRODUODENOSCOPY (EGD) WITH PROPOFOL;  Surgeon: Corbin Ade, MD;  Location: [facility];  Service: Endoscopy;  Laterality: N/A;  [id number]  . none     Family History  Problem Relation Age of Onset  . Cirrhosis Father     deceased  . Colon cancer Neg Hx   . Heart disease Sister     3 open heart surgeries  . Heart disease Mother   . Heart disease Other     grandmother   Social History   Social History  . Marital status: Single    Spouse name: N/A  . Number of children: N/A  . Years of education: N/A   Social History Main Topics  . Smoking status: Former Smoker    Packs/day: 2.00    Years: 5.00    Types: Cigarettes    Start date: 06/06/1976    Quit date: 05/12/1983  . Smokeless tobacco: Never Used     Comment: Quit in 1984  . Alcohol use No     Comment: quit 01/2014  . Drug use: Yes    Types: Marijuana     Comment: everyday  . Sexual activity: Yes    Birth control/ protection: None   Other Topics Concern  . None   Social History Narrative  .  None    ROS:  General: Negative for anorexia, weight loss, fever, chills, fatigue, weakness. ENT: Negative for hoarseness, difficulty swallowing , nasal congestion. CV: Negative for chest pain, angina, palpitations, dyspnea on exertion, peripheral edema.  Respiratory: Negative for dyspnea at rest, dyspnea on exertion, cough, sputum, wheezing.  GI: See history of present illness. GU:  Negative for dysuria, hematuria, urinary incontinence, urinary frequency, nocturnal urination.  Endo: Negative for unusual weight change.    Physical Examination:   BP 122/76   Pulse 74   Temp 97.4 F (36.3 C) (Oral)   Ht 6' (1.829 m)   Wt 148 lb 9.6 oz (67.4 kg)   BMI 20.15 kg/m   General:  Well-nourished, well-developed in no acute distress.  Eyes: No icterus. Mouth: Oropharyngeal mucosa moist and pink , no lesions erythema or exudate. Lungs: Clear to auscultation bilaterally.  Heart: Regular rate and rhythm, no murmurs rubs or gallops.  Abdomen: Bowel sounds are normal, ruq tenderness, nondistended, no hepatosplenomegaly or masses, no abdominal bruits or hernia , no rebound or guarding.   Extremities: No lower extremity edema. No clubbing or deformities. Neuro: Alert and oriented x 4   Skin: Warm and dry, no jaundice.   Psych: Alert and cooperative, normal mood and affect.  Labs:   Lab Results  Component Value Date   ALT 45 12/04/2015   AST 46 (H) 12/04/2015   ALKPHOS 52 12/04/2015   BILITOT 0.9 12/04/2015    Imaging Studies: No results found.

## 2017-02-14 NOTE — Patient Instructions (Signed)
1. Please have your labs and ultrasound done. 2. Start nexium once daily before breakfast.  3. For worsening abdominal pain or fever, go to the nearest ER.

## 2017-02-16 NOTE — Assessment & Plan Note (Addendum)
Worsening ruq pain. Abnormal gallbladder on u/s 2 years ago and patient never had his surgery. Update u/s with elastography at this time. Obtain labs. For worsening pain in the interim, go to er. Resume ppi for heartburn/epigastric pain.

## 2017-02-16 NOTE — Assessment & Plan Note (Signed)
Genotype 1b, treatment naive. f/u on u/s and labs. Likely pursue treatment once gb issues have been addressed.

## 2017-02-17 NOTE — Progress Notes (Signed)
cc'ed to pcp °

## 2017-02-19 ENCOUNTER — Ambulatory Visit (HOSPITAL_COMMUNITY): Payer: Self-pay

## 2017-03-04 ENCOUNTER — Ambulatory Visit (HOSPITAL_COMMUNITY): Admission: RE | Admit: 2017-03-04 | Payer: Self-pay | Source: Ambulatory Visit

## 2017-04-07 ENCOUNTER — Other Ambulatory Visit: Payer: Self-pay | Admitting: Physician Assistant

## 2017-04-07 DIAGNOSIS — Z125 Encounter for screening for malignant neoplasm of prostate: Secondary | ICD-10-CM

## 2017-04-07 DIAGNOSIS — B182 Chronic viral hepatitis C: Secondary | ICD-10-CM

## 2017-04-21 ENCOUNTER — Other Ambulatory Visit (HOSPITAL_COMMUNITY)
Admission: RE | Admit: 2017-04-21 | Discharge: 2017-04-21 | Disposition: A | Discharge status: Home / Self Care | Payer: Self-pay | Source: Ambulatory Visit | Attending: Physician Assistant | Admitting: Physician Assistant

## 2017-04-21 DIAGNOSIS — B182 Chronic viral hepatitis C: Secondary | ICD-10-CM | POA: Insufficient documentation

## 2017-04-21 DIAGNOSIS — Z125 Encounter for screening for malignant neoplasm of prostate: Secondary | ICD-10-CM | POA: Insufficient documentation

## 2017-04-21 LAB — COMPREHENSIVE METABOLIC PANEL
ALBUMIN: 4.2 g/dL (ref 3.5–5.0)
ALT: 39 U/L (ref 17–63)
ANION GAP: 7 (ref 5–15)
AST: 33 U/L (ref 15–41)
Alkaline Phosphatase: 56 U/L (ref 38–126)
BUN: 6 mg/dL (ref 6–20)
CALCIUM: 8.9 mg/dL (ref 8.9–10.3)
CO2: 26 mmol/L (ref 22–32)
Chloride: 104 mmol/L (ref 101–111)
Creatinine, Ser: 0.75 mg/dL (ref 0.61–1.24)
GFR calc non Af Amer: >60 mL/min (ref >60)
Glucose, Bld: 100 mg/dL — ABNORMAL HIGH (ref 65–99)
POTASSIUM: 4 mmol/L (ref 3.5–5.1)
Sodium: 137 mmol/L (ref 135–145)
Total Bilirubin: 0.7 mg/dL (ref 0.3–1.2)
Total Protein: 7.5 g/dL (ref 6.5–8.1)

## 2017-04-21 LAB — PSA: PROSTATIC SPECIFIC ANTIGEN: 0.33 ng/mL (ref 0.00–4.00)

## 2017-04-24 ENCOUNTER — Ambulatory Visit: Payer: Self-pay | Admitting: Physician Assistant

## 2017-05-07 ENCOUNTER — Ambulatory Visit: Payer: Self-pay | Admitting: Physician Assistant

## 2017-05-12 ENCOUNTER — Encounter: Payer: Self-pay | Admitting: Physician Assistant

## 2017-07-17 ENCOUNTER — Ambulatory Visit: Payer: Self-pay | Admitting: Physician Assistant

## 2017-07-17 ENCOUNTER — Encounter (HOSPITAL_COMMUNITY): Payer: Self-pay | Admitting: *Deleted

## 2017-07-17 ENCOUNTER — Emergency Department (HOSPITAL_COMMUNITY)
Admission: EM | Admit: 2017-07-17 | Discharge: 2017-07-17 | Disposition: A | Discharge status: Home / Self Care | Payer: Self-pay | Attending: Emergency Medicine | Admitting: Emergency Medicine

## 2017-07-17 ENCOUNTER — Encounter: Payer: Self-pay | Admitting: Physician Assistant

## 2017-07-17 ENCOUNTER — Emergency Department (HOSPITAL_COMMUNITY): Payer: Self-pay

## 2017-07-17 VITALS — BP 142/78 | HR 83 | Temp 97.5°F | Wt 144.5 lb

## 2017-07-17 DIAGNOSIS — J449 Chronic obstructive pulmonary disease, unspecified: Secondary | ICD-10-CM

## 2017-07-17 DIAGNOSIS — B182 Chronic viral hepatitis C: Secondary | ICD-10-CM

## 2017-07-17 DIAGNOSIS — R110 Nausea: Secondary | ICD-10-CM | POA: Insufficient documentation

## 2017-07-17 DIAGNOSIS — Z9119 Patient's noncompliance with other medical treatment and regimen: Secondary | ICD-10-CM

## 2017-07-17 DIAGNOSIS — J45909 Unspecified asthma, uncomplicated: Secondary | ICD-10-CM | POA: Insufficient documentation

## 2017-07-17 DIAGNOSIS — R1011 Right upper quadrant pain: Secondary | ICD-10-CM

## 2017-07-17 DIAGNOSIS — K8020 Calculus of gallbladder without cholecystitis without obstruction: Secondary | ICD-10-CM

## 2017-07-17 DIAGNOSIS — Z87891 Personal history of nicotine dependence: Secondary | ICD-10-CM | POA: Insufficient documentation

## 2017-07-17 DIAGNOSIS — Z91199 Patient's noncompliance with other medical treatment and regimen due to unspecified reason: Secondary | ICD-10-CM

## 2017-07-17 LAB — CBC
HEMATOCRIT: 47.4 % (ref 39.0–52.0)
Hemoglobin: 16.6 g/dL (ref 13.0–17.0)
MCH: 33.1 pg (ref 26.0–34.0)
MCHC: 35 g/dL (ref 30.0–36.0)
MCV: 94.6 fL (ref 78.0–100.0)
Platelets: 183 10*3/uL (ref 150–400)
RBC: 5.01 MIL/uL (ref 4.22–5.81)
RDW: 13.4 % (ref 11.5–15.5)
WBC: 7.8 10*3/uL (ref 4.0–10.5)

## 2017-07-17 LAB — URINALYSIS, ROUTINE W REFLEX MICROSCOPIC
Bilirubin Urine: NEGATIVE
Glucose, UA: NEGATIVE mg/dL
Hgb urine dipstick: NEGATIVE
KETONES UR: NEGATIVE mg/dL
LEUKOCYTES UA: NEGATIVE
Nitrite: NEGATIVE
PH: 6 (ref 5.0–8.0)
Protein, ur: NEGATIVE mg/dL
Specific Gravity, Urine: 1.021 (ref 1.005–1.030)

## 2017-07-17 LAB — COMPREHENSIVE METABOLIC PANEL
ALK PHOS: 57 U/L (ref 38–126)
ALT: 31 U/L (ref 17–63)
AST: 25 U/L (ref 15–41)
Albumin: 4.7 g/dL (ref 3.5–5.0)
Anion gap: 10 (ref 5–15)
BUN: 6 mg/dL (ref 6–20)
CALCIUM: 9.4 mg/dL (ref 8.9–10.3)
CHLORIDE: 104 mmol/L (ref 101–111)
CO2: 26 mmol/L (ref 22–32)
CREATININE: 0.71 mg/dL (ref 0.61–1.24)
GFR calc Af Amer: >60 mL/min (ref >60)
GFR calc non Af Amer: >60 mL/min (ref >60)
GLUCOSE: 93 mg/dL (ref 65–99)
Potassium: 3.4 mmol/L — ABNORMAL LOW (ref 3.5–5.1)
SODIUM: 140 mmol/L (ref 135–145)
Total Bilirubin: 1.1 mg/dL (ref 0.3–1.2)
Total Protein: 8.1 g/dL (ref 6.5–8.1)

## 2017-07-17 LAB — LIPASE, BLOOD: LIPASE: 64 U/L — AB (ref 11–51)

## 2017-07-17 MED ORDER — ONDANSETRON HCL 4 MG/2ML IJ SOLN
4.0000 mg | Freq: Once | INTRAMUSCULAR | Status: AC
  Administered 2017-07-17: 4 mg via INTRAVENOUS
  Filled 2017-07-17: qty 2

## 2017-07-17 MED ORDER — TRAMADOL HCL 50 MG PO TABS: 50.0000 mg | ORAL_TABLET | Freq: Four times a day (QID) | ORAL | 0 refills | Status: DC | PRN

## 2017-07-17 MED ORDER — MORPHINE SULFATE (PF) 4 MG/ML IV SOLN
4.0000 mg | Freq: Once | INTRAVENOUS | Status: AC
  Administered 2017-07-17: 4 mg via INTRAVENOUS
  Filled 2017-07-17: qty 1

## 2017-07-17 MED ORDER — ONDANSETRON 4 MG PO TBDP
4.0000 mg | ORAL_TABLET | Freq: Three times a day (TID) | ORAL | 0 refills | Status: DC | PRN
Start: 2017-07-17 — End: 2017-08-20

## 2017-07-17 NOTE — Discharge Instructions (Signed)

## 2017-07-17 NOTE — ED Notes (Signed)
Patient transported to Ultrasound 

## 2017-07-17 NOTE — ED Notes (Signed)
Pt ambulatory to waiting room. Pt verbalized understanding of discharge instructions.   

## 2017-07-17 NOTE — ED Notes (Signed)
Pt returned to ED

## 2017-07-17 NOTE — ED Triage Notes (Signed)
Pt comes in with RUQ pain. Pt has had nausea and vomiting. [facility] is [facility] and they told him it was a gall bladder problem.

## 2017-07-17 NOTE — ED Provider Notes (Signed)
Emergency Department Provider Note   I have reviewed the triage vital signs and the nursing notes.   HISTORY  Chief Complaint Abdominal Pain   HPI Edgar Roberts is a 56 y.o. male with PMH of asthma, COPD, GERD, and Hep C presents to the emergency department for evaluation ofworsening right upper quadrant abdominal pain. Patient states that over the past 2 days he's had significant nausea, pain, and has not felt like eating or drinking very much. Denies fevers or chills. No associated diarrhea or vomiting. No lower abdominal pain. No dysuria, hesitancy, urgency. [facility] and referred to the ED for further evaluation of his abdominal pain. No other modifying factors. Pain radiates slightly to the right lateral abdomen.    Past Medical History:  Diagnosis Date  . Asthma   . Chronic hepatitis C (HCC)   . COPD (chronic obstructive pulmonary disease) (HCC)   . GERD (gastroesophageal reflux disease)     Patient Active Problem List   Diagnosis Date Noted  . RUQ pain 02/14/2017  . Cigarette nicotine dependence with nicotine-induced disorder 06/03/2016  . Chronic obstructive pulmonary disease (HCC) 12/04/2015  . Cirrhosis of liver without ascites (HCC)   . Mucosal abnormality of stomach   . Hiatal hernia   . Reflux esophagitis   . Abnormal gallbladder ultrasound 08/17/2015  . Esophageal dysphagia 08/17/2015  . Chest pain 06/19/2015  . GERD (gastroesophageal reflux disease) 06/19/2015  . Chronic hepatitis C (HCC) 05/08/2015  . Abdominal pain, epigastric 05/08/2015  . Loss of weight 05/08/2015  . Nausea without vomiting 05/08/2015    Past Surgical History:  Procedure Laterality Date  . BIOPSY N/A 09/04/2015   Procedure: BIOPSY;  Surgeon: Corbin Ade, MD;  Location: [facility];  Service: Endoscopy;  Laterality: N/A;  gastric  . ESOPHAGEAL DILATION N/A 09/04/2015   Procedure:  ESOPHAGEAL DILATION;  Surgeon: Corbin Ade, MD;  Location: [facility];   Service: Endoscopy;  Laterality: N/A;  maloney 56  . ESOPHAGOGASTRODUODENOSCOPY (EGD) WITH PROPOFOL N/A 09/04/2015   Procedure: ESOPHAGOGASTRODUODENOSCOPY (EGD) WITH PROPOFOL;  Surgeon: Corbin Ade, MD;  Location: [facility];  Service: Endoscopy;  Laterality: N/A;  1610  . none      Current Outpatient Rx  . Order #: [id number] Class: Print  . Order #: [id number] Class: Sample  . Order #: [id number] Class: Print  . Order #: [id number] Class: Print  . Order #: [id number] Class: Print    Allergies Patient has no known allergies.  Family History  Problem Relation Age of Onset  . Cirrhosis Father        deceased  . Heart disease Sister        3 open heart surgeries  . Heart disease Mother   . Heart disease Other        grandmother  . Colon cancer Neg Hx     Social History Social History  Substance Use Topics  . Smoking status: Former Smoker    Packs/day: 2.00    Years: 5.00    Types: Cigarettes    Start date: 06/06/1976    Quit date: 05/12/1983  . Smokeless tobacco: Never Used     Comment: Quit in 1984  . Alcohol use No     Comment: quit 01/2014    Review of Systems  Constitutional: No fever/chills Eyes: No visual changes. ENT: No sore throat. Cardiovascular: Denies chest pain. Respiratory: Denies shortness of breath. Gastrointestinal: Positive RUQ abdominal pain. Positive nausea, no vomiting.  No diarrhea.  No constipation.  Genitourinary: Negative for dysuria. Musculoskeletal: Negative for back pain. Skin: Negative for rash. Neurological: Negative for headaches, focal weakness or numbness.  10-point ROS otherwise negative.  ____________________________________________   PHYSICAL EXAM:  VITAL SIGNS: ED Triage Vitals  Enc Vitals Group     BP 07/17/17 1406 (!) 147/81     Pulse Rate 07/17/17 1406 79     Resp 07/17/17 1406 18     Temp 07/17/17 1406 98.3 F (36.8 C)     Temp Source 07/17/17 1406 Oral     SpO2 07/17/17 1406 99 %     Weight 07/17/17 1406 144 lb (65.3  kg)     Height 07/17/17 1406 6' (1.829 m)     Pain Score 07/17/17 1405 9   Constitutional: Alert and oriented. Well appearing and in no acute distress. Eyes: Conjunctivae are normal. Head: Atraumatic. Nose: No congestion/rhinnorhea. Mouth/Throat: Mucous membranes are moist.   Neck: No stridor. Cardiovascular: Normal rate, regular rhythm. Good peripheral circulation. Grossly normal heart sounds.   Respiratory: Normal respiratory effort.  No retractions. Lungs CTAB. Gastrointestinal: Soft with focal RUQ tenderness to palpation. Positive Murphy's sign. No distention.  Musculoskeletal: No lower extremity tenderness nor edema. No gross deformities of extremities. Neurologic:  Normal speech and language. No gross focal neurologic deficits are appreciated.  Skin:  Skin is warm, dry and intact. No rash noted.  ____________________________________________   LABS (all labs ordered are listed, but only abnormal results are displayed)  Labs Reviewed  LIPASE, BLOOD - Abnormal; Notable for the following:       Result Value   Lipase 64 (*)    All other components within normal limits  COMPREHENSIVE METABOLIC PANEL - Abnormal; Notable for the following:    Potassium 3.4 (*)    All other components within normal limits  URINALYSIS, ROUTINE W REFLEX MICROSCOPIC - Abnormal; Notable for the following:    APPearance HAZY (*)    All other components within normal limits  CBC   ____________________________________________  RADIOLOGY  US Abdomen Limited Ruq  Result Date: 07/17/2017 CLINICAL DATA:  Right upper quadrant abdominal pain. EXAM: ULTRASOUND ABDOMEN LIMITED RIGHT UPPER QUADRANT COMPARISON:  Ultrasound of June 26, 2015. FINDINGS: Gallbladder: No gallstones or wall thickening visualized. No sonographic Murphy sign noted by sonographer. There are again noted nonshadowing and non mobile echogenic foci along the gallbladder wall which may represent adenomyomatosis. Common bile duct: Diameter:  3.4 mm which is within normal limits. Liver: No focal lesion identified. Within normal limits in parenchymal echogenicity. Portal vein is patent on color Doppler imaging with normal direction of blood flow towards the liver. 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 No other significant abnormality seen in the right upper quadrant of the abdomen. Electronically Signed   By: Lupita Raider, M.D.   On: 07/17/2017 18:24    ____________________________________________   PROCEDURES  Procedure(s) performed:   Procedures  None ____________________________________________   INITIAL IMPRESSION / ASSESSMENT AND PLAN / ED COURSE  Pertinent labs & imaging results that were available during my care of the patient were reviewed by me and considered in my medical decision making (see chart for details).  Patient resents to the emergency room in for evaluation of right upper quadrant abdominal pain. Labs in triage ordered are unremarkable with only slight elevation in lipase. Patient is focally tender in the right upper quadrant raising concern for gallbladder pathology. Patient has history of chronic hepatitis C and liver cirrhosis without ascites which may be complicating this  diagnosis clinically. Plan for RUQ [place] and maintain NPO status at this time.   [place] reviewed with patient. Plan to follow up with general surgery as an outpatient. Patient feeling much better after IVF, pain/nausea medication. Repeat abdominal exam is largely unremarkable now with only mild tenderness.   At this time, I do not feel there is any life-threatening condition present. I have reviewed and discussed all results (EKG, imaging, lab, urine as appropriate), exam findings with patient. I have reviewed nursing notes and appropriate previous records.  I feel the patient is safe to be discharged home without further emergent workup. Discussed usual and customary return precautions. Patient and  family (if present) verbalize understanding and are comfortable with this plan.  Patient will follow-up with their primary care provider. If they do not have a primary care provider, information for follow-up has been provided to them. All questions have been answered.  ____________________________________________  FINAL CLINICAL IMPRESSION(S) / ED DIAGNOSES  Final diagnoses:  RUQ abdominal pain  Nausea     MEDICATIONS GIVEN DURING THIS VISIT:  Medications  ondansetron (ZOFRAN) injection 4 mg (4 mg Intravenous Given 07/17/17 1928)  morphine 4 MG/ML injection 4 mg (4 mg Intravenous Given 07/17/17 1928)     NEW OUTPATIENT MEDICATIONS STARTED DURING THIS VISIT:  Discharge Medication List as of 07/17/2017  8:28 PM    START taking these medications   Details  ondansetron (ZOFRAN ODT) 4 MG disintegrating tablet Take 1 tablet (4 mg total) by mouth every 8 (eight) hours as needed for nausea or vomiting., Starting Thu 07/17/2017, Print    traMADol (ULTRAM) 50 MG tablet Take 1 tablet (50 mg total) by mouth every 6 (six) hours as needed., Starting Thu 07/17/2017, Print        Note:  This document was prepared using Dragon voice recognition software and may include unintentional dictation errors.  Alona Bene, MD Emergency Medicine    Unita Detamore, Arlyss Repress, MD 07/18/17 1120

## 2017-07-17 NOTE — Progress Notes (Signed)
BP (!) 142/78 (BP Location: Left Arm, Patient Position: Sitting, Cuff Size: Normal)   Pulse 83   Temp (!) 97.5 F (36.4 C)   Wt 144 lb 8 oz (65.5 kg)   SpO2 97%   BMI 19.60 kg/m    Subjective:    Patient ID: Edgar Roberts, male    DOB: 04-04-1961, 56 y.o.   MRN: [id number]  HPI: [name] is a 56 y.o. male presenting on 07/17/2017 for Abdominal Pain (on RUQ. pt states it's starting to  hurt when he moves. pt states he feels pain when he eats and feels nauseous. pt states pain started about 7 days ago.)   HPI  Chief Complaint  Patient presents with  . Abdominal Pain    on RUQ. pt states it's starting to  hurt when he moves. pt states he feels pain when he eats and feels nauseous. pt states pain started about 7 days ago.    Pt was told to get [place] by GI in April when seen for the same pain he is having now. He did not get the test done. He has not returned to GI for follow up.  He is 3 months past due to follow up with me.     Pt with this same pain 2 years ago and was supposed to have his gallbladder removed but he never had the surgery.    Pt with nausea. A few episodes emesis.  Pain is constant.  Unable to eat.   Pt is out of all of his medicines.   Relevant past medical, surgical, family and social history reviewed and updated as indicated. Interim medical history since our last visit reviewed. Allergies and medications reviewed and updated.    Current Outpatient Prescriptions:  .  albuterol (PROVENTIL HFA;VENTOLIN HFA) 108 (90 Base) MCG/ACT inhaler, Inhale 2 puffs into the lungs every 6 (six) hours as needed for wheezing or shortness of breath. (Patient not taking: Reported on 07/17/2017), Disp: 3 Inhaler, Rfl: 3 .  esomeprazole (NEXIUM) 20 MG capsule, Take 1 capsule (20 mg total) by mouth daily before breakfast. (Patient not taking: Reported on 07/17/2017), Disp: 20 capsule, Rfl: 0 .  fluticasone-salmeterol (ADVAIR HFA) 230-21 MCG/ACT inhaler, Inhale 2 puffs into the  lungs 2 (two) times daily. (Patient not taking: Reported on 07/17/2017), Disp: 3 Inhaler, Rfl: 3  Review of Systems  Constitutional: Negative for appetite change, chills, diaphoresis, fatigue, fever and unexpected weight change.  HENT: Positive for dental problem. Negative for congestion, drooling, ear pain, facial swelling, hearing loss, mouth sores, sneezing, sore throat, trouble swallowing and voice change.   Eyes: Negative for pain, discharge, redness, itching and visual disturbance.  Respiratory: Positive for shortness of breath. Negative for cough, choking and wheezing.   Cardiovascular: Negative for chest pain, palpitations and leg swelling.  Gastrointestinal: Positive for abdominal pain. Negative for blood in stool, constipation, diarrhea and vomiting.  Endocrine: Negative for cold intolerance, heat intolerance and polydipsia.  Genitourinary: Negative for decreased urine volume, dysuria and hematuria.  Musculoskeletal: Negative for arthralgias, back pain and gait problem.  Skin: Negative for rash.  Allergic/Immunologic: Negative for environmental allergies.  Neurological: Positive for headaches. Negative for seizures, syncope and light-headedness.  Hematological: Negative for adenopathy.  Psychiatric/Behavioral: Negative for agitation, dysphoric mood and suicidal ideas. The patient is nervous/anxious.     Per HPI unless specifically indicated above     Objective:    BP (!) 142/78 (BP Location: Left Arm, Patient Position: Sitting, Cuff Size: Normal)  Pulse 83   Temp (!) 97.5 F (36.4 C)   Wt 144 lb 8 oz (65.5 kg)   SpO2 97%   BMI 19.60 kg/m   Wt Readings from Last 3 Encounters:  07/17/17 144 lb 8 oz (65.5 kg)  02/14/17 148 lb 9.6 oz (67.4 kg)  01/23/17 146 lb (66.2 kg)    Physical Exam  Constitutional: He is oriented to person, place, and time. He appears well-developed and well-nourished.  HENT:  Head: Normocephalic and atraumatic.  Neck: Neck supple.   Cardiovascular: Normal rate and regular rhythm.   Pulmonary/Chest: Effort normal and breath sounds normal. He has no wheezes.  Abdominal: Soft. Bowel sounds are normal. He exhibits no distension, no fluid wave, no ascites and no mass. There is no hepatosplenomegaly. There is tenderness in the right upper quadrant. There is guarding. There is no rigidity, no rebound and no CVA tenderness.  Musculoskeletal: He exhibits no edema.  Lymphadenopathy:    He has no cervical adenopathy.  Neurological: He is alert and oriented to person, place, and time.  Skin: Skin is warm and dry.  Psychiatric: He has a normal mood and affect. His behavior is normal.  Vitals reviewed.       Assessment & Plan:    Encounter Diagnoses  Name Primary?  . Right upper quadrant abdominal pain Yes  . Personal history of noncompliance with medical treatment, presenting hazards to health   . Gallstones   . Chronic hepatitis C without hepatic coma (HCC)   . Chronic obstructive pulmonary disease, unspecified COPD type (HCC)      -pt to go to ER to evaluate for abdominal pain.  Likely his gallbladder that should have been removed 2 years ago -pt has follow up appointment next week

## 2017-07-24 ENCOUNTER — Encounter: Payer: Self-pay | Admitting: Physician Assistant

## 2017-07-24 ENCOUNTER — Ambulatory Visit: Payer: Self-pay | Admitting: Physician Assistant

## 2017-07-24 VITALS — BP 108/64 | HR 75 | Temp 98.1°F | Ht 72.0 in | Wt 145.5 lb

## 2017-07-24 DIAGNOSIS — K219 Gastro-esophageal reflux disease without esophagitis: Secondary | ICD-10-CM

## 2017-07-24 DIAGNOSIS — J449 Chronic obstructive pulmonary disease, unspecified: Secondary | ICD-10-CM

## 2017-07-24 DIAGNOSIS — B182 Chronic viral hepatitis C: Secondary | ICD-10-CM

## 2017-07-24 DIAGNOSIS — R1011 Right upper quadrant pain: Secondary | ICD-10-CM

## 2017-07-24 NOTE — Patient Instructions (Signed)
[facility]- [phone]

## 2017-07-24 NOTE — Progress Notes (Signed)
BP 108/64 (BP Location: Left Arm, Patient Position: Sitting, Cuff Size: Normal)   Pulse 75   Temp 98.1 F (36.7 C) (Other (Comment))   Ht 6' (1.829 m)   Wt 145 lb 8 oz (66 kg)   SpO2 97%   BMI 19.73 kg/m    Subjective:    Patient ID: Edgar Roberts, male    DOB: [birth year], 56 y.o.   MRN: [id number]  HPI: Edgar Roberts is a 56 y.o. male presenting on 07/24/2017 for COPD   HPI   Pt had [place] in ER last week.   He is feeling much better today  Pt is out of his advair.  Relevant past medical, surgical, family and social history reviewed and updated as indicated. Interim medical history since our last visit reviewed. Allergies and medications reviewed and updated.   Current Outpatient Prescriptions:  .  albuterol (PROVENTIL HFA;VENTOLIN HFA) 108 (90 Base) MCG/ACT inhaler, Inhale 2 puffs into the lungs every 6 (six) hours as needed for wheezing or shortness of breath., Disp: 3 Inhaler, Rfl: 3 .  esomeprazole (NEXIUM) 20 MG capsule, Take 1 capsule (20 mg total) by mouth daily before breakfast., Disp: 20 capsule, Rfl: 0 .  ondansetron (ZOFRAN ODT) 4 MG disintegrating tablet, Take 1 tablet (4 mg total) by mouth every 8 (eight) hours as needed for nausea or vomiting., Disp: 20 tablet, Rfl: 0 .  traMADol (ULTRAM) 50 MG tablet, Take 1 tablet (50 mg total) by mouth every 6 (six) hours as needed., Disp: 15 tablet, Rfl: 0 .  fluticasone-salmeterol (ADVAIR HFA) 230-21 MCG/ACT inhaler, Inhale 2 puffs into the lungs 2 (two) times daily. (Patient not taking: Reported on 07/24/2017), Disp: 3 Inhaler, Rfl: 3  Review of Systems  Constitutional: Negative for appetite change, chills, diaphoresis, fatigue, fever and unexpected weight change.  HENT: Negative for congestion, dental problem, drooling, ear pain, facial swelling, hearing loss, mouth sores, sneezing, sore throat, trouble swallowing and voice change.   Eyes: Negative for pain, discharge, redness, itching and visual disturbance.  Respiratory:  Negative for cough, choking, shortness of breath and wheezing.   Cardiovascular: Negative for chest pain, palpitations and leg swelling.  Gastrointestinal: Negative for abdominal pain, blood in stool, constipation, diarrhea and vomiting.  Endocrine: Negative for cold intolerance, heat intolerance and polydipsia.  Genitourinary: Negative for decreased urine volume, dysuria and hematuria.  Musculoskeletal: Negative for arthralgias, back pain and gait problem.  Skin: Negative for rash.  Allergic/Immunologic: Negative for environmental allergies.  Neurological: Negative for seizures, syncope, light-headedness and headaches.  Hematological: Negative for adenopathy.  Psychiatric/Behavioral: Negative for agitation, dysphoric mood and suicidal ideas. The patient is not nervous/anxious.     Per HPI unless specifically indicated above     Objective:    BP 108/64 (BP Location: Left Arm, Patient Position: Sitting, Cuff Size: Normal)   Pulse 75   Temp 98.1 F (36.7 C) (Other (Comment))   Ht 6' (1.829 m)   Wt 145 lb 8 oz (66 kg)   SpO2 97%   BMI 19.73 kg/m   Wt Readings from Last 3 Encounters:  07/24/17 145 lb 8 oz (66 kg)  07/17/17 144 lb (65.3 kg)  07/17/17 144 lb 8 oz (65.5 kg)    Physical Exam  Constitutional: He is oriented to person, place, and time. He appears well-developed and well-nourished.  HENT:  Head: Normocephalic and atraumatic.  Neck: Neck supple.  Cardiovascular: Normal rate and regular rhythm.   Pulmonary/Chest: Effort normal and breath sounds normal. He has no wheezes.  Abdominal: Soft. Bowel sounds are normal. There is no hepatosplenomegaly. There is no tenderness.  Musculoskeletal: He exhibits no edema.  Lymphadenopathy:    He has no cervical adenopathy.  Neurological: He is alert and oriented to person, place, and time.  Skin: Skin is warm and dry.  Psychiatric: He has a normal mood and affect. His behavior is normal.  Vitals reviewed.       Assessment &  Plan:    Encounter Diagnoses  Name Primary?  . Chronic obstructive pulmonary disease, unspecified COPD type (HCC) Yes  . Chronic hepatitis C without hepatic coma (HCC)   . Right upper quadrant abdominal pain   . Gastroesophageal reflux disease, esophagitis presence not specified     -pt told to call and get appt to follow up with GI . He is given the phone number -pt has refills on his advair. He is counseled on how to  Call for refills -pt to Follow up 3 months.  RTO sooner prn

## 2017-08-12 ENCOUNTER — Ambulatory Visit: Payer: Self-pay | Admitting: Physician Assistant

## 2017-08-20 ENCOUNTER — Ambulatory Visit (INDEPENDENT_AMBULATORY_CARE_PROVIDER_SITE_OTHER): Payer: Self-pay | Admitting: Gastroenterology

## 2017-08-20 ENCOUNTER — Encounter: Payer: Self-pay | Admitting: Gastroenterology

## 2017-08-20 VITALS — BP 140/73 | HR 80 | Temp 98.4°F | Ht 72.0 in | Wt 142.6 lb

## 2017-08-20 DIAGNOSIS — K5900 Constipation, unspecified: Secondary | ICD-10-CM

## 2017-08-20 DIAGNOSIS — R1011 Right upper quadrant pain: Secondary | ICD-10-CM

## 2017-08-20 DIAGNOSIS — B182 Chronic viral hepatitis C: Secondary | ICD-10-CM

## 2017-08-20 DIAGNOSIS — K2100 Gastro-esophageal reflux disease with esophagitis, without bleeding: Secondary | ICD-10-CM

## 2017-08-20 MED ORDER — ONDANSETRON HCL 4 MG PO TABS: 4.0000 mg | ORAL_TABLET | Freq: Three times a day (TID) | ORAL | 0 refills | Status: DC | PRN

## 2017-08-20 MED ORDER — LINACLOTIDE 145 MCG PO CAPS: 145.0000 ug | ORAL_CAPSULE | Freq: Every day | ORAL | 1 refills | Status: DC

## 2017-08-20 MED ORDER — ESOMEPRAZOLE MAGNESIUM 20 MG PO CPDR: 20.0000 mg | DELAYED_RELEASE_CAPSULE | Freq: Every day | ORAL | 5 refills | Status: DC

## 2017-08-20 NOTE — Patient Instructions (Addendum)
1. Please have labs done as soon as possible. 2. Once you drop stool specimen off at the lab, please start back on Nexium daily.  Prescription sent to pharmacy or you can buy over-the-counter. 3. Samples of Linzess provided for constipation.  Prescription sent as well but this could be very expensive and we may need to complete patient assistance forms if you find it effective and necessary to stay on long-term. 4. I will send referral to surgery to discuss getting your gallbladder out.

## 2017-08-20 NOTE — Progress Notes (Signed)
Primary Care Physician: Jacquelin HawkingMcElroy, Shannon, PA-C  Primary Gastroenterologist:  Roetta SessionsMichael Rourk, MD   Chief Complaint  Patient presents with  . Follow-up    Hep C/constipation. Has BM about 4 times per week    HPI: Edgar Roberts is a 56 y.o. male here for follow-up.  Last seen in April 2018.chronic Hep C, genotype 1b, treatment naive and abd pain. Patient states he never followed through with having his gallbladder removed and liver biopsy in 2016. He has missed several appointments stating secondary to his "phone not working". Patient reports no etoh in over 2 years. 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   He failed to have u/s with elastography and labs after last ov. In 01/2017. He was seen in ED 07/17/17 for worsening ruq abd pain. ruq u/s showed nonshadowing and non mobile echogenic foci along gb wall, ?adenomyomatosis. LFTs were normal. CBC normal. Lipase 64, mildly elevated.ED recommended he follow up with surgery.   Patient presents today complaining of constipation and ongoing intermittent  ruq pain. Pain worse at times but pretty persistent. States the zofran and ultram the ed gave helped. Pain worse with meals. No hb. No vomiting. No melena, brbpr. BM 3-4 per week. He wants to get treated for Hep C soon. Currently out of nexium and has been off for few weeks. H/o H.pylori gastritis, will check for eradication.   Current Outpatient Prescriptions  Medication Sig Dispense Refill  . albuterol (PROVENTIL HFA;VENTOLIN HFA) 108 (90 Base) MCG/ACT inhaler Inhale 2 puffs into the lungs every 6 (six) hours as needed for wheezing or shortness of breath. 3 Inhaler 3  . esomeprazole (NEXIUM) 20 MG capsule Take 1 capsule (20 mg total) by mouth daily before breakfast. 20 capsule 0  . fluticasone-salmeterol (ADVAIR HFA) 230-21 MCG/ACT inhaler Inhale 2 puffs into the lungs 2 (two) times daily. 3 Inhaler 3   No current  facility-administered medications for this visit.     Allergies as of 08/20/2017  . (No Known Allergies)   Past Medical History:  Diagnosis Date  . Asthma   . Chronic hepatitis C (HCC)   . COPD (chronic obstructive pulmonary disease) (HCC)   . GERD (gastroesophageal reflux disease)    Past Surgical History:  Procedure Laterality Date  . BIOPSY N/A 09/04/2015   Procedure: BIOPSY;  Surgeon: Corbin Adeobert M Rourk, MD;  Location: [facility];  Service: Endoscopy;  Laterality: N/A;  gastric  . ESOPHAGEAL DILATION N/A 09/04/2015   Procedure:  ESOPHAGEAL DILATION;  Surgeon: Corbin Adeobert M Rourk, MD;  Location: [facility];  Service: Endoscopy;  Laterality: N/A;  maloney 56  . ESOPHAGOGASTRODUODENOSCOPY (EGD) WITH PROPOFOL N/A 09/04/2015   Procedure: ESOPHAGOGASTRODUODENOSCOPY (EGD) WITH PROPOFOL;  Surgeon: Corbin Adeobert M Rourk, MD;  Location: [facility];  Service: Endoscopy;  Laterality: N/A;  1610;  0815  . none      ROS:  General: Negative for anorexia, weight loss, fever, chills, fatigue, weakness. ENT: Negative for hoarseness, difficulty swallowing , nasal congestion. CV: Negative for chest pain, angina, palpitations, dyspnea on exertion, peripheral edema.  Respiratory: Negative for dyspnea at rest, dyspnea on exertion, cough, sputum, wheezing.  GI: See history of present illness. GU:  Negative for dysuria, hematuria, urinary incontinence, urinary frequency, nocturnal urination.  Endo: Negative for unusual weight change.    Physical Examination:   BP 140/73   Pulse 80   Temp 98.4 F (36.9 C) (Oral)   Ht 6' (1.829 m)   Hartford FinancialWt  142 lb 9.6 oz (64.7 kg)   BMI 19.34 kg/m   General: Well-nourished, well-developed in no acute distress.  Eyes: No icterus. Mouth: Oropharyngeal mucosa moist and pink , no lesions erythema or exudate. Lungs: Clear to auscultation bilaterally.  Heart: Regular rate and rhythm, no murmurs rubs or gallops.  Abdomen: Bowel sounds are normal, ruq moderately tender especially with deep inspiration and  palpation, nondistended, no hepatosplenomegaly or masses, no abdominal bruits or hernia , no rebound or guarding.   Extremities: No lower extremity edema. No clubbing or deformities. Neuro: Alert and oriented x 4   Skin: Warm and dry, no jaundice.   Psych: Alert and cooperative, normal mood and affect.  Labs:  Lab Results  Component Value Date   CREATININE 0.71 07/17/2017   BUN 6 07/17/2017   NA 140 07/17/2017   K 3.4 (L) 07/17/2017   CL 104 07/17/2017   CO2 26 07/17/2017   Lab Results  Component Value Date   ALT 31 07/17/2017   AST 25 07/17/2017   ALKPHOS 57 07/17/2017   BILITOT 1.1 07/17/2017   Lab Results  Component Value Date   WBC 7.8 07/17/2017   HGB 16.6 07/17/2017   HCT 47.4 07/17/2017   MCV 94.6 07/17/2017   PLT 183 07/17/2017    Imaging Studies: No results found.

## 2017-08-22 ENCOUNTER — Ambulatory Visit: Payer: Self-pay | Admitting: Gastroenterology

## 2017-08-24 NOTE — Assessment & Plan Note (Signed)
Suspect biliary pain. Have recommended on multiple occasions to see surgeon for liver bx and to get gallbladder out. He is willing to pursue at this time. Arrange for referral. In interim, will check for h.pylori eradication and then resume PPI.

## 2017-08-24 NOTE — Assessment & Plan Note (Signed)
Pursue labs as outlined. Begin treatment in near future.

## 2017-08-24 NOTE — Assessment & Plan Note (Signed)
Doing well on Linzess. Please note, patient is not interested in pursuing colonoscopy.

## 2017-08-25 NOTE — Progress Notes (Signed)
CC'ED TO PCP 

## 2017-10-29 ENCOUNTER — Ambulatory Visit: Payer: Self-pay | Admitting: Physician Assistant

## 2017-11-05 ENCOUNTER — Encounter: Payer: Self-pay | Admitting: Physician Assistant

## 2017-11-05 ENCOUNTER — Ambulatory Visit: Payer: Self-pay | Admitting: Physician Assistant

## 2017-11-05 VITALS — BP 110/72 | HR 85 | Temp 97.7°F | Ht 72.0 in | Wt 145.5 lb

## 2017-11-05 DIAGNOSIS — K2100 Gastro-esophageal reflux disease with esophagitis, without bleeding: Secondary | ICD-10-CM

## 2017-11-05 DIAGNOSIS — R1011 Right upper quadrant pain: Secondary | ICD-10-CM

## 2017-11-05 DIAGNOSIS — K5900 Constipation, unspecified: Secondary | ICD-10-CM

## 2017-11-05 DIAGNOSIS — Z13220 Encounter for screening for lipoid disorders: Secondary | ICD-10-CM

## 2017-11-05 DIAGNOSIS — J449 Chronic obstructive pulmonary disease, unspecified: Secondary | ICD-10-CM

## 2017-11-05 DIAGNOSIS — B182 Chronic viral hepatitis C: Secondary | ICD-10-CM

## 2017-11-05 DIAGNOSIS — F17219 Nicotine dependence, cigarettes, with unspecified nicotine-induced disorders: Secondary | ICD-10-CM

## 2017-11-05 MED ORDER — FLUTICASONE-SALMETEROL 230-21 MCG/ACT IN AERO: 2.0000 | INHALATION_SPRAY | Freq: Two times a day (BID) | RESPIRATORY_TRACT | 3 refills | Status: DC

## 2017-11-05 MED ORDER — ALBUTEROL SULFATE HFA 108 (90 BASE) MCG/ACT IN AERS: 2.0000 | INHALATION_SPRAY | Freq: Four times a day (QID) | RESPIRATORY_TRACT | 3 refills | Status: DC | PRN

## 2017-11-05 NOTE — Progress Notes (Signed)
BP 110/72 (BP Location: Left Arm, Patient Position: Sitting, Cuff Size: Normal)   Pulse 85   Temp 97.7 F (36.5 C) (Other (Comment))   Ht 6' (1.829 m)   Wt 145 lb 8 oz (66 kg)   SpO2 96%   BMI 19.73 kg/m    Subjective:    Patient ID: Edgar Roberts, male    DOB: 1961-10-12, 57 y.o.   MRN: [id number]  HPI: Edgar Roberts is a 57 y.o. male presenting on 11/05/2017 for COPD   HPI  Pt went to GI in October.  Note reviewed- it says they were going to refer to surgery reference possible cholecystectomy and also that hepatitis treatment would begin in the near future.  Pt never went and had bloodwork done that was ordered.     Pt says he wants me to order the labwork.  Discussed with pt that this is fine but he shouldn't have waited 3 months because it delays his treatment  Pt is out of all of his medications.  He says his breathing is okay but he can tell he is out of his inhalers.   Relevant past medical, surgical, family and social history reviewed and updated as indicated. Interim medical history since our last visit reviewed. Allergies and medications reviewed and updated.  CURRENT MEDS:   None (supposed to be on advair, albuterol mdi, nexium, linzess, zofran)  Review of Systems  Constitutional: Negative for appetite change, chills, diaphoresis, fatigue, fever and unexpected weight change.  HENT: Positive for dental problem. Negative for congestion, drooling, ear pain, facial swelling, hearing loss, mouth sores, sneezing, sore throat, trouble swallowing and voice change.   Eyes: Negative for pain, discharge, redness, itching and visual disturbance.  Respiratory: Negative for cough, choking, shortness of breath and wheezing.   Cardiovascular: Negative for chest pain, palpitations and leg swelling.  Gastrointestinal: Negative for abdominal pain, blood in stool, constipation, diarrhea and vomiting.  Endocrine: Negative for cold intolerance, heat intolerance and polydipsia.   Genitourinary: Negative for decreased urine volume, dysuria and hematuria.  Musculoskeletal: Negative for arthralgias, back pain and gait problem.  Skin: Negative for rash.  Allergic/Immunologic: Negative for environmental allergies.  Neurological: Negative for seizures, syncope, light-headedness and headaches.  Hematological: Negative for adenopathy.  Psychiatric/Behavioral: Negative for agitation, dysphoric mood and suicidal ideas. The patient is not nervous/anxious.     Per HPI unless specifically indicated above     Objective:    BP 110/72 (BP Location: Left Arm, Patient Position: Sitting, Cuff Size: Normal)   Pulse 85   Temp 97.7 F (36.5 C) (Other (Comment))   Ht 6' (1.829 m)   Wt 145 lb 8 oz (66 kg)   SpO2 96%   BMI 19.73 kg/m   Wt Readings from Last 3 Encounters:  11/05/17 145 lb 8 oz (66 kg)  08/20/17 142 lb 9.6 oz (64.7 kg)  07/24/17 145 lb 8 oz (66 kg)    Physical Exam  Constitutional: He is oriented to person, place, and time. He appears well-developed and well-nourished.  HENT:  Head: Normocephalic and atraumatic.  Neck: Neck supple.  Cardiovascular: Normal rate and regular rhythm.  Pulmonary/Chest: Effort normal and breath sounds normal. He has no wheezes.  Abdominal: Soft. Bowel sounds are normal. There is no hepatosplenomegaly. There is no tenderness.  Musculoskeletal: He exhibits no edema.  Lymphadenopathy:    He has no cervical adenopathy.  Neurological: He is alert and oriented to person, place, and time.  Skin: Skin is warm and dry.  Psychiatric: He has a normal mood and affect. His behavior is normal.  Vitals reviewed.        Assessment & Plan:    Encounter Diagnoses  Name Primary?  . Chronic obstructive pulmonary disease, unspecified COPD type (HCC) Yes  . Chronic hepatitis C without hepatic coma (HCC)   . Reflux esophagitis   . Abdominal pain, right upper quadrant   . Constipation, unspecified constipation type   . Screening  cholesterol level   . Cigarette nicotine dependence with nicotine-induced disorder     -entered GI labs for pt.  Will forward when results available -check lipids -pt completed paperwork today to renew inhalers -pt to follow up here in 4 months.  RTO sooner prn

## 2017-11-06 ENCOUNTER — Other Ambulatory Visit (HOSPITAL_COMMUNITY)
Admission: RE | Admit: 2017-11-06 | Discharge: 2017-11-06 | Disposition: A | Discharge status: Home / Self Care | Payer: Self-pay | Source: Ambulatory Visit | Attending: Physician Assistant | Admitting: Physician Assistant

## 2017-11-06 DIAGNOSIS — R69 Illness, unspecified: Secondary | ICD-10-CM | POA: Insufficient documentation

## 2017-11-06 LAB — LIPID PANEL
Cholesterol: 167 mg/dL (ref 0–200)
HDL: 34 mg/dL — ABNORMAL LOW (ref >40)
LDL CALC: 120 mg/dL — AB (ref 0–99)
TRIGLYCERIDES: 66 mg/dL (ref <150)
Total CHOL/HDL Ratio: 4.9 RATIO
VLDL: 13 mg/dL (ref 0–40)

## 2017-11-06 LAB — PROTIME-INR
INR: 0.97
Prothrombin Time: 12.8 seconds (ref 11.4–15.2)

## 2017-11-06 LAB — COMPREHENSIVE METABOLIC PANEL
ALK PHOS: 53 U/L (ref 38–126)
ALT: 51 U/L (ref 17–63)
AST: 41 U/L (ref 15–41)
Albumin: 4.5 g/dL (ref 3.5–5.0)
Anion gap: 12 (ref 5–15)
BUN: 8 mg/dL (ref 6–20)
CALCIUM: 9.6 mg/dL (ref 8.9–10.3)
CHLORIDE: 100 mmol/L — AB (ref 101–111)
CO2: 25 mmol/L (ref 22–32)
Creatinine, Ser: 0.8 mg/dL (ref 0.61–1.24)
GFR calc non Af Amer: >60 mL/min (ref >60)
GLUCOSE: 103 mg/dL — AB (ref 65–99)
Potassium: 4.1 mmol/L (ref 3.5–5.1)
SODIUM: 137 mmol/L (ref 135–145)
Total Bilirubin: 0.9 mg/dL (ref 0.3–1.2)
Total Protein: 7.9 g/dL (ref 6.5–8.1)

## 2017-11-07 LAB — HEPATITIS B SURFACE ANTIBODY,QUALITATIVE: HEP B S AB: NONREACTIVE

## 2017-11-07 LAB — HEPATITIS B SURFACE ANTIGEN: Hepatitis B Surface Ag: NEGATIVE

## 2017-11-08 LAB — HIV ANTIBODY (ROUTINE TESTING W REFLEX): HIV Screen 4th Generation wRfx: NONREACTIVE

## 2017-11-08 LAB — H. PYLORI ANTIGEN, STOOL: H. Pylori Stool Ag, Eia: NEGATIVE

## 2017-11-10 LAB — MISC LABCORP TEST (SEND OUT): LABCORP TEST CODE: 550123

## 2017-11-10 LAB — HCV RNA QUANT RFLX ULTRA OR GENOTYP

## 2017-11-12 NOTE — Progress Notes (Signed)
Unfortunately his HCV RNA quantitative with reflex did not produce a result, (technical problems and no more specimen available for testing).   Patient needs to go back to lab for HCV RNA quant with genotype reflex.  Needs f/u OV. Unfortunately 3 months since I last saw him and requested him to have labs ASAP.

## 2017-11-13 ENCOUNTER — Encounter: Payer: Self-pay | Admitting: Internal Medicine

## 2017-11-13 ENCOUNTER — Other Ambulatory Visit: Payer: Self-pay

## 2017-11-13 DIAGNOSIS — K739 Chronic hepatitis, unspecified: Secondary | ICD-10-CM

## 2017-11-13 NOTE — Progress Notes (Signed)
PATIENT SCHEDULED  °

## 2017-11-20 ENCOUNTER — Other Ambulatory Visit (HOSPITAL_COMMUNITY)
Admission: RE | Admit: 2017-11-20 | Discharge: 2017-11-20 | Disposition: A | Discharge status: Home / Self Care | Payer: Self-pay | Source: Ambulatory Visit | Attending: Physician Assistant | Admitting: Physician Assistant

## 2017-11-20 DIAGNOSIS — K739 Chronic hepatitis, unspecified: Secondary | ICD-10-CM

## 2017-11-20 DIAGNOSIS — K2100 Gastro-esophageal reflux disease with esophagitis, without bleeding: Secondary | ICD-10-CM

## 2017-11-20 DIAGNOSIS — R1011 Right upper quadrant pain: Secondary | ICD-10-CM

## 2017-11-20 DIAGNOSIS — K5900 Constipation, unspecified: Secondary | ICD-10-CM

## 2017-11-20 DIAGNOSIS — B182 Chronic viral hepatitis C: Secondary | ICD-10-CM

## 2017-11-20 DIAGNOSIS — Z13220 Encounter for screening for lipoid disorders: Secondary | ICD-10-CM

## 2017-11-20 LAB — COMPREHENSIVE METABOLIC PANEL
ALBUMIN: 4.5 g/dL (ref 3.5–5.0)
ALK PHOS: 52 U/L (ref 38–126)
ALT: 51 U/L (ref 17–63)
AST: 42 U/L — AB (ref 15–41)
Anion gap: 12 (ref 5–15)
BILIRUBIN TOTAL: 0.9 mg/dL (ref 0.3–1.2)
BUN: 8 mg/dL (ref 6–20)
CALCIUM: 9.6 mg/dL (ref 8.9–10.3)
CO2: 24 mmol/L (ref 22–32)
CREATININE: 0.91 mg/dL (ref 0.61–1.24)
Chloride: 100 mmol/L — ABNORMAL LOW (ref 101–111)
GFR calc Af Amer: >60 mL/min (ref >60)
GFR calc non Af Amer: >60 mL/min (ref >60)
GLUCOSE: 98 mg/dL (ref 65–99)
Potassium: 4.1 mmol/L (ref 3.5–5.1)
Sodium: 136 mmol/L (ref 135–145)
TOTAL PROTEIN: 8 g/dL (ref 6.5–8.1)

## 2017-11-20 LAB — PROTIME-INR
INR: 0.99
Prothrombin Time: 13 seconds (ref 11.4–15.2)

## 2017-11-20 LAB — LIPID PANEL
CHOLESTEROL: 176 mg/dL (ref 0–200)
HDL: 37 mg/dL — ABNORMAL LOW (ref >40)
LDL Cholesterol: 117 mg/dL — ABNORMAL HIGH (ref 0–99)
Total CHOL/HDL Ratio: 4.8 RATIO
Triglycerides: 110 mg/dL (ref <150)
VLDL: 22 mg/dL (ref 0–40)

## 2017-11-21 LAB — HEPATITIS B SURFACE ANTIBODY,QUALITATIVE: HEP B S AB: NONREACTIVE

## 2017-11-21 LAB — HEPATITIS B SURFACE ANTIGEN: HEP B S AG: NEGATIVE

## 2017-11-21 LAB — HIV ANTIBODY (ROUTINE TESTING W REFLEX): HIV SCREEN 4TH GENERATION: NONREACTIVE

## 2017-11-23 LAB — HCV RNA QUANT RFLX ULTRA OR GENOTYP
HCV RNA QNT(LOG COPY/ML): 6.378 {Log_IU}/mL
HepC Qn: 2390000 IU/mL

## 2017-11-23 LAB — HEPATITIS C GENOTYPE

## 2017-12-02 NOTE — Progress Notes (Signed)
Unfortunately his care is disjointed as he never follows thru in timely manner.  His h.pylori stool ag is negative.  Not sure why lab did all the same test again when I just needed the HCVRNA quant repeated but it is positive and genotype 1b.  I DON'T SEE THE HCV FIBROSURE TEST RESULTS, [facility] SENT IT OUT UNDER MISC TEST. CAN WE FOLLOW UP ON THAT. I NEED RESULTS BEFORE HIS UPCOMING OV.

## 2017-12-30 ENCOUNTER — Ambulatory Visit (INDEPENDENT_AMBULATORY_CARE_PROVIDER_SITE_OTHER): Payer: Self-pay | Admitting: Gastroenterology

## 2017-12-30 ENCOUNTER — Encounter: Payer: Self-pay | Admitting: Gastroenterology

## 2017-12-30 ENCOUNTER — Telehealth: Payer: Self-pay

## 2017-12-30 VITALS — BP 136/88 | HR 68 | Temp 97.0°F | Ht 72.0 in | Wt 149.4 lb

## 2017-12-30 DIAGNOSIS — K219 Gastro-esophageal reflux disease without esophagitis: Secondary | ICD-10-CM

## 2017-12-30 DIAGNOSIS — B182 Chronic viral hepatitis C: Secondary | ICD-10-CM

## 2017-12-30 DIAGNOSIS — R932 Abnormal findings on diagnostic imaging of liver and biliary tract: Secondary | ICD-10-CM

## 2017-12-30 DIAGNOSIS — K7460 Unspecified cirrhosis of liver: Secondary | ICD-10-CM

## 2017-12-30 DIAGNOSIS — K5900 Constipation, unspecified: Secondary | ICD-10-CM

## 2017-12-30 DIAGNOSIS — R1011 Right upper quadrant pain: Secondary | ICD-10-CM

## 2017-12-30 MED ORDER — OMEPRAZOLE 20 MG PO TBEC: 20.0000 mg | DELAYED_RELEASE_TABLET | Freq: Every day | ORAL | 5 refills | Status: DC

## 2017-12-30 MED ORDER — POLYETHYLENE GLYCOL 3350 17 G PO PACK: 17.0000 g | PACK | Freq: Every day | ORAL | 5 refills | Status: DC

## 2017-12-30 NOTE — Progress Notes (Signed)
Primary Care Physician: Jacquelin Hawking, PA-C  Primary Gastroenterologist:  Roetta Sessions, MD   Chief Complaint  Patient presents with  . Constipation    f/u. BM's are once a day, he is straining  . Hepatitis C    f/u.     HPI: Edgar Roberts is a 57 y.o. male here for follow-up. Last seen in October 2018. He has a history of chronic hepatitis C, genotype Ib, treatment nave and abdominal pain. We have had a problem with compliance as far as following through to see the surgeon for possible gallbladder removal and liver biopsy. He has a history of adenomyomatosis of the gb, ?gallstones, Meatvir fibrosis score of F3 and F4 previously. He also has had very disjointed care as he never follows through with recommendations in a timely manner (ie labs). With history of H. pylori gastritis, previous treatment, with documented eradication in January. Patient has declined colonoscopy previously.   Complains of hard stools. Bowel movement daily. Ranging from [place] one to [place] four. Did well on Linzess but only took the samples. No melena or rectal bleeding. Complains of heartburn frequently. Nausea without vomiting. He has been off of Nexium since he stopped it to take his stool test for H. pylori.   At this point his only interest is to be treated for hepatitis C. He is postponing seeing a Careers adviser for now.  Current Outpatient Medications  Medication Sig Dispense Refill  . albuterol (PROVENTIL HFA;VENTOLIN HFA) 108 (90 Base) MCG/ACT inhaler Inhale 2 puffs into the lungs every 6 (six) hours as needed for wheezing or shortness of breath. 3 Inhaler 3  . esomeprazole (NEXIUM) 20 MG capsule Take 1 capsule (20 mg total) by mouth daily before breakfast. 30 capsule 5  . fluticasone-salmeterol (ADVAIR HFA) 230-21 MCG/ACT inhaler Inhale 2 puffs into the lungs 2 (two) times daily. 3 Inhaler 3   No current facility-administered medications for this visit.     Allergies as of 12/30/2017  . (No  Known Allergies)    ROS:  General: Negative for anorexia, weight loss, fever, chills, fatigue, weakness. ENT: Negative for hoarseness, difficulty swallowing , nasal congestion. CV: Negative for chest pain, angina, palpitations, dyspnea on exertion, peripheral edema.  Respiratory: Negative for dyspnea at rest, dyspnea on exertion, cough, sputum, wheezing.  GI: See history of present illness. GU:  Negative for dysuria, hematuria, urinary incontinence, urinary frequency, nocturnal urination.  Endo: Negative for unusual weight change.    Physical Examination:   BP 136/88   Pulse 68   Temp (!) 97 F (36.1 C) (Oral)   Ht 6' (1.829 m)   Wt 149 lb 6.4 oz (67.8 kg)   BMI 20.26 kg/m   General: Well-nourished, well-developed in no acute distress.  Eyes: No icterus. Mouth: Oropharyngeal mucosa moist and pink , no lesions erythema or exudate. Lungs: Clear to auscultation bilaterally.  Heart: Regular rate and rhythm, no murmurs rubs or gallops.  Abdomen: Bowel sounds are normal,  Mild to moderate epig/ruq tenderness, nondistended, no hepatosplenomegaly or masses, no abdominal bruits or hernia , no rebound or guarding.   Extremities: No lower extremity edema. No clubbing or deformities. Neuro: Alert and oriented x 4   Skin: Warm and dry, no jaundice.   Psych: Alert and cooperative, normal mood and affect.  Labs:  Lab Results  Component Value Date   CREATININE 0.91 11/20/2017   BUN 8 11/20/2017   NA 136 11/20/2017   K 4.1 11/20/2017   CL 100 (L)  11/20/2017   CO2 24 11/20/2017   Lab Results  Component Value Date   ALT 51 11/20/2017   AST 42 (H) 11/20/2017   ALKPHOS 52 11/20/2017   BILITOT 0.9 11/20/2017   Lab Results  Component Value Date   WBC 7.8 07/17/2017   HGB 16.6 07/17/2017   HCT 47.4 07/17/2017   MCV 94.6 07/17/2017   PLT 183 07/17/2017   Lab Results  Component Value Date   INR 0.99 11/20/2017   INR 0.97 11/06/2017   INR 1.00 05/08/2015    Labs from January  2019, hepatitis B surface antigen negative, HCV RNA 2,390,000, HIV-negative, hepatitis B surface antibody negative, HCV genotype 1B,   Hepatitis C virus fibrosure: 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

## 2017-12-30 NOTE — Assessment & Plan Note (Addendum)
Chronic hepatitis C, genotype 1B, treatment nave. F3/F4 seen on previous elastography. Fibrosure with stage III fibrosis/macro inflammatory activity grade A1 to A2. Will manage as a cirrhotic as far as HCV treatment and for hepatoma surveillance. He is due for ruq u/s now.   Work on Administrator, sportspatient approval for [employer] for 12 weeks. When we get first labs on therapy will obtain hep b core total to be complete.   Patient has been advised regarding compliance, taking medication everyday at the same time, not starting new medications without approval due to risk of decreasing efficacy of Harvoni or drug interactions. He voiced understanding.

## 2017-12-30 NOTE — Patient Instructions (Signed)
1. Please have your ultrasound done. 2. We will start patient assistance for hepatitis C medication today. 3. Remember when you get your gallbladder removed, ask for liver biopsy as well. I will postpone pursuing surgery right now at your request but needs to be addressed in the next few months due to your pain, abnormal appearing gallbladder on ultrasound.

## 2017-12-30 NOTE — Assessment & Plan Note (Signed)
Declines on surgical evaluation, previously did not follow through with multiple referrals. Explain the reason for need of cholecystectomy as well as liver biopsy at time of gallbladder surgery. Will pursue at a later date when he is ready. If he ends up with the gallbladder surgery more emergently he has been advised to request a liver biopsy so that we can sort out whether he has advanced fibrosis versus early cirrhosis.

## 2017-12-30 NOTE — Progress Notes (Signed)
cc'ed to pcp °

## 2017-12-30 NOTE — Assessment & Plan Note (Signed)
Advised that he should not take Nexium as with most hepatitis C medications PPI is not recommended but if needed they recommend omeprazole 20 mg daily. New RX provided.

## 2017-12-30 NOTE — Telephone Encounter (Signed)
Pt was given forms to complete and return for patient assistance for Harvoni.

## 2018-01-02 ENCOUNTER — Ambulatory Visit (HOSPITAL_COMMUNITY)
Admission: RE | Admit: 2018-01-02 | Discharge: 2018-01-02 | Disposition: A | Discharge status: Home / Self Care | Payer: Self-pay | Source: Ambulatory Visit | Attending: Gastroenterology | Admitting: Gastroenterology

## 2018-01-02 DIAGNOSIS — R932 Abnormal findings on diagnostic imaging of liver and biliary tract: Secondary | ICD-10-CM | POA: Insufficient documentation

## 2018-01-02 DIAGNOSIS — K7460 Unspecified cirrhosis of liver: Secondary | ICD-10-CM | POA: Insufficient documentation

## 2018-01-02 DIAGNOSIS — B182 Chronic viral hepatitis C: Secondary | ICD-10-CM | POA: Insufficient documentation

## 2018-01-02 DIAGNOSIS — R1011 Right upper quadrant pain: Secondary | ICD-10-CM | POA: Insufficient documentation

## 2018-01-08 NOTE — Telephone Encounter (Signed)
Pt returned his paperwork and forms on desk for Tana CoastLeslie Lewis, PA to complete.

## 2018-01-09 NOTE — Telephone Encounter (Signed)
Completed.

## 2018-01-09 NOTE — Progress Notes (Signed)
Stables changes in gallbladder. No hepatic lesions.   Repeat ruq u/s in six months for hepatoma surveillance.

## 2018-01-12 NOTE — Telephone Encounter (Signed)
Per Tawanna Coolerodd from [employer], the paperwork was received and forwarded to the support path program. We should hear from them in a week or so. They do not have [employer] in the loop any longer.

## 2018-01-12 NOTE — Progress Notes (Signed)
On recall  °

## 2018-01-12 NOTE — Telephone Encounter (Signed)
Paperwork faxed to [employer].

## 2018-01-20 ENCOUNTER — Telehealth: Payer: Self-pay

## 2018-01-20 NOTE — Telephone Encounter (Signed)
Paperwork mailed to pt to add more info to the [employer] paperwork. He is to complete and mail it back or bring it by the office.

## 2018-01-20 NOTE — Telephone Encounter (Signed)
T/C from [place] S @ [employer] Path. I was able to give him the info the patient had given me, that the $200 annual income comes from his uncle. Pt said his uncle lives across the street from him and helps him out since he has been sick. Per Greig CastillaAndrew, he will submit that information and let me know if he needs the paperwork that the pt is updating.

## 2018-01-27 NOTE — Telephone Encounter (Signed)
[facility] [phone].

## 2018-01-28 ENCOUNTER — Telehealth: Payer: Self-pay

## 2018-01-28 NOTE — Telephone Encounter (Addendum)
Pt was approved for the Harvoni through the [employer] program and shipment can be expected on Monday 02/02/2018.

## 2018-02-02 NOTE — Telephone Encounter (Signed)
Harvoni has arrived. Forwarding to Tana CoastLeslie Lewis, PA for instructions.

## 2018-02-03 ENCOUNTER — Other Ambulatory Visit: Payer: Self-pay | Admitting: Gastroenterology

## 2018-02-03 NOTE — Telephone Encounter (Addendum)
Harvoni for Hepatitis C Instructions:   1. Take Harvoni, one tablet daily with or without food. You should take it at approximately the same time every day.  Do not miss a dose. You will start on 02/05/18 and end on *04/30/18 (12 weeks of treatment).   2. Do not run out of Harvoni! If you are down to one week of medication left and have not heard about your next shipment, please let us know as soon as possible.   3. If you need to start a new medication, prescription from your doctor or over the counter medication, you need to contact us to make sure it does not interfere with Harvoni. There are several medications that can interfere with Harvoni and can make you sick or make the medication not work.  4. If you need to take a medication for acid reflux, your choices are as follows: 1. TUMS or Rolaids separated at least four hours from [place].  2. Pepcid (famotidine) 40mg  up to twice per day administered with Harvoni or 12 hours apart from Harvoni.  3. Omeprazole 20mg  with Harvoni on EMPTY stomach.   5. Tylenol (acetominophen) and Advil (ibuprofen) are safe to take with Harvoni if needed for headache, fever, pain.   6. You will have blood work done after 4 weeks of treatment to make sure you are tolerating Harvoni and to see if the medication is getting rid of the Hepatitis C.   7. DO NOT stop Harvoni unless instructed to by your provider.  8. You will also have blood work done at the end of treatment and 12-24 weeks after end of treatment.        Patient signature: ____________________  Date: _______________

## 2018-02-03 NOTE — Telephone Encounter (Addendum)
Dates entered into template. Other instructions as previously outlined for labs and f/u ov.

## 2018-02-03 NOTE — Telephone Encounter (Signed)
Doris, find out when patient can come in to pick up his meds. Once I know that, I will fill in the start and finish dates on his template. CAN YOU PLEASE GO OVER THE HARVONI INSTRUCTIONS WITH PATIENT WHEN HE COMES TO PICK UP MED. GET HIM TO SIGN INSTRUCTIONS INCLUDING LIST OF HIS MEDICATIONS.   MAKE SURE HE IS ON OMEPRAZOLE NOW NOT NEXIUM. I changed him at last ov.  HE WILL NEED LABS FOUR WEEKS AFTER STARTING HARVONI CMET, HCV RNA quantitative, Hep B core total antibody  FOLLOW UP OV IN FOUR WEEKS, OK TO USE URGENT. PLEASE SCHEDULE WITH [facility] ONLY.

## 2018-02-03 NOTE — Telephone Encounter (Signed)
Pt will be here tomorrow at 10:00 am.  He has started the Omeprazole and stopped the Nexium.

## 2018-02-04 ENCOUNTER — Other Ambulatory Visit: Payer: Self-pay

## 2018-02-04 DIAGNOSIS — B182 Chronic viral hepatitis C: Secondary | ICD-10-CM

## 2018-02-04 NOTE — Telephone Encounter (Signed)
Pt came by and picked up the Harvoni.  I reviewed all of the instructions with him. He is now taking the Omeprazole and NOT the Nexium. He is also taking Metamucil and NOT the miralax. He signed papers with those written in and initialed by him and me.  Papers to be scanned in epic.  He is aware to have labs a couple of days prior to his next appt. Appt card given to him for ov with Tana CoastLeslie Lewis, PA on 03/05/2018 at 11:30 Am.

## 2018-02-06 NOTE — Telephone Encounter (Signed)
Noted  

## 2018-02-16 ENCOUNTER — Other Ambulatory Visit: Payer: Self-pay

## 2018-02-16 DIAGNOSIS — B182 Chronic viral hepatitis C: Secondary | ICD-10-CM

## 2018-02-23 ENCOUNTER — Telehealth: Payer: Self-pay

## 2018-02-23 NOTE — Telephone Encounter (Signed)
Received a call from [facility] patient assistance. harvoni will be delivered on 02/26/18.

## 2018-02-25 NOTE — Telephone Encounter (Signed)
PT is aware and said he will be by on Monday when he comes to town to do his labs.

## 2018-03-02 ENCOUNTER — Other Ambulatory Visit (HOSPITAL_COMMUNITY)
Admission: RE | Admit: 2018-03-02 | Discharge: 2018-03-02 | Disposition: A | Discharge status: Home / Self Care | Payer: Self-pay | Source: Ambulatory Visit | Attending: Gastroenterology | Admitting: Gastroenterology

## 2018-03-02 DIAGNOSIS — B182 Chronic viral hepatitis C: Secondary | ICD-10-CM | POA: Insufficient documentation

## 2018-03-02 LAB — COMPREHENSIVE METABOLIC PANEL
ALT: 14 U/L — AB (ref 17–63)
AST: 17 U/L (ref 15–41)
Albumin: 4.2 g/dL (ref 3.5–5.0)
Alkaline Phosphatase: 55 U/L (ref 38–126)
Anion gap: 9 (ref 5–15)
BILIRUBIN TOTAL: 0.8 mg/dL (ref 0.3–1.2)
BUN: 7 mg/dL (ref 6–20)
CHLORIDE: 102 mmol/L (ref 101–111)
CO2: 28 mmol/L (ref 22–32)
CREATININE: 0.69 mg/dL (ref 0.61–1.24)
Calcium: 9.5 mg/dL (ref 8.9–10.3)
GFR calc Af Amer: >60 mL/min (ref >60)
GLUCOSE: 83 mg/dL (ref 65–99)
Potassium: 3.8 mmol/L (ref 3.5–5.1)
Sodium: 139 mmol/L (ref 135–145)
Total Protein: 7.7 g/dL (ref 6.5–8.1)

## 2018-03-03 LAB — HCV RNA QUANT
HCV QUANT LOG: 1.301 {Log_IU}/mL — AB (ref >1.70)
HCV QUANT: 20 [IU]/mL — AB (ref >50)

## 2018-03-03 LAB — HEPATITIS B CORE ANTIBODY, TOTAL: HEP B C TOTAL AB: NEGATIVE

## 2018-03-04 ENCOUNTER — Ambulatory Visit: Payer: Self-pay | Admitting: Physician Assistant

## 2018-03-04 ENCOUNTER — Encounter: Payer: Self-pay | Admitting: Physician Assistant

## 2018-03-04 VITALS — BP 110/70 | HR 85 | Temp 97.7°F | Ht 72.0 in | Wt 148.2 lb

## 2018-03-04 DIAGNOSIS — Z125 Encounter for screening for malignant neoplasm of prostate: Secondary | ICD-10-CM

## 2018-03-04 DIAGNOSIS — J449 Chronic obstructive pulmonary disease, unspecified: Secondary | ICD-10-CM

## 2018-03-04 DIAGNOSIS — B182 Chronic viral hepatitis C: Secondary | ICD-10-CM

## 2018-03-04 NOTE — Progress Notes (Signed)
BP 110/70   Pulse 85   Temp 97.7 F (36.5 C)   Ht 6' (1.829 m)   Wt 148 lb 4 oz (67.2 kg)   SpO2 98%   BMI 20.11 kg/m    Subjective:    Patient ID: Edgar Roberts, male    DOB: 14-Aug-1961, 57 y.o.   MRN: [id number]  HPI: Edgar Roberts is a 58 y.o. male presenting on 03/04/2018 for COPD   HPI   He is on treatment for hepatitis.   He is feeling well and has no complaints today  Relevant past medical, surgical, family and social history reviewed and updated as indicated. Interim medical history since our last visit reviewed. Allergies and medications reviewed and updated.   Current Outpatient Medications:  .  albuterol (PROVENTIL HFA;VENTOLIN HFA) 108 (90 Base) MCG/ACT inhaler, Inhale 2 puffs into the lungs every 6 (six) hours as needed for wheezing or shortness of breath., Disp: 3 Inhaler, Rfl: 3 .  fluticasone-salmeterol (ADVAIR HFA) 230-21 MCG/ACT inhaler, Inhale 2 puffs into the lungs 2 (two) times daily., Disp: 3 Inhaler, Rfl: 3 .  Ledipasvir-Sofosbuvir (HARVONI PO), Take 1 tablet by mouth daily., Disp: , Rfl:  .  Omeprazole 20 MG TBEC, Take 1 tablet (20 mg total) by mouth daily before breakfast., Disp: 30 each, Rfl: 5 .  polyethylene glycol (MIRALAX / GLYCOLAX) packet, Take 17 g by mouth daily., Disp: 30 packet, Rfl: 5   Review of Systems  Constitutional: Negative for appetite change, chills, diaphoresis, fatigue, fever and unexpected weight change.  HENT: Negative for congestion, dental problem, drooling, ear pain, facial swelling, hearing loss, mouth sores, sneezing, sore throat, trouble swallowing and voice change.   Eyes: Negative for pain, discharge, redness, itching and visual disturbance.  Respiratory: Positive for shortness of breath. Negative for cough, choking and wheezing.   Cardiovascular: Negative for chest pain, palpitations and leg swelling.  Gastrointestinal: Negative for abdominal pain, blood in stool, constipation, diarrhea and vomiting.  Endocrine:  Negative for cold intolerance, heat intolerance and polydipsia.  Genitourinary: Negative for decreased urine volume, dysuria and hematuria.  Musculoskeletal: Negative for arthralgias, back pain and gait problem.  Skin: Negative for rash.  Allergic/Immunologic: Negative for environmental allergies.  Neurological: Negative for seizures, syncope, light-headedness and headaches.  Hematological: Negative for adenopathy.  Psychiatric/Behavioral: Negative for agitation, dysphoric mood and suicidal ideas. The patient is not nervous/anxious.     Per HPI unless specifically indicated above     Objective:    BP 110/70   Pulse 85   Temp 97.7 F (36.5 C)   Ht 6' (1.829 m)   Wt 148 lb 4 oz (67.2 kg)   SpO2 98%   BMI 20.11 kg/m   Wt Readings from Last 3 Encounters:  03/04/18 148 lb 4 oz (67.2 kg)  12/30/17 149 lb 6.4 oz (67.8 kg)  11/05/17 145 lb 8 oz (66 kg)    Physical Exam  Constitutional: He is oriented to person, place, and time. He appears well-developed and well-nourished.  HENT:  Head: Normocephalic and atraumatic.  Neck: Neck supple.  Cardiovascular: Normal rate and regular rhythm.  Pulmonary/Chest: Effort normal and breath sounds normal. He has no wheezes.  Abdominal: Soft. Bowel sounds are normal. There is no hepatosplenomegaly. There is no tenderness.  Musculoskeletal: He exhibits no edema.  Lymphadenopathy:    He has no cervical adenopathy.  Neurological: He is alert and oriented to person, place, and time.  Skin: Skin is warm and dry.  Psychiatric: He has a normal  mood and affect. His behavior is normal.  Vitals reviewed.       Assessment & Plan:   Encounter Diagnoses  Name Primary?  . Chronic obstructive pulmonary disease, unspecified COPD type (HCC) Yes  . Chronic hepatitis C without hepatic coma (HCC)   . Screening for prostate cancer      -pt to continue current medications -counseled pt to stop smoking marijuana to help his COPD -pt to continue with GI  for hepatitis treatment -pt to follow up  4 months.  RTO sooner prn

## 2018-03-05 ENCOUNTER — Ambulatory Visit (INDEPENDENT_AMBULATORY_CARE_PROVIDER_SITE_OTHER): Payer: Self-pay | Admitting: Gastroenterology

## 2018-03-05 ENCOUNTER — Encounter: Payer: Self-pay | Admitting: Gastroenterology

## 2018-03-05 VITALS — BP 118/73 | HR 76 | Temp 97.7°F | Ht 72.0 in | Wt 147.4 lb

## 2018-03-05 DIAGNOSIS — B182 Chronic viral hepatitis C: Secondary | ICD-10-CM

## 2018-03-05 NOTE — Progress Notes (Signed)
      Primary Care Physician: Jacquelin Hawking, PA-C  Primary Gastroenterologist:  Roetta Sessions, MD   Chief Complaint  Patient presents with  . Hepatitis C    taking Harvoni    HPI: Edgar Roberts is a 57 y.o. male here for follow-up.  He has a history of chronic hepatitis C, genotype 1b, treatment nave.  Also history of abdominal pain, previously recommended cholecystectomy along with liver biopsy but he has postponed on numerous occasions in part due to lack of insurance.  History of adenomyomatosis of the gallbladder, questionable gallstones, meld of your fibrosis score of F3 and F4 previously.  Patient previously has declined colonoscopy.  He has a history of H. pylori gastritis with documented eradication in January 2019.  He has recently completed 4 weeks of Harvoni.  Plans to treat for 12 weeks.  He had HCVRNA quantitative test done 3 days ago, count was down to 20 from over 2 million back in January. Clinically he is feeling much better. Energy level improved. Less abd pain. Appetite better except when he gets real hot. No GI complaints today.   Current Outpatient Medications  Medication Sig Dispense Refill  . albuterol (PROVENTIL HFA;VENTOLIN HFA) 108 (90 Base) MCG/ACT inhaler Inhale 2 puffs into the lungs every 6 (six) hours as needed for wheezing or shortness of breath. 3 Inhaler 3  . fluticasone-salmeterol (ADVAIR HFA) 230-21 MCG/ACT inhaler Inhale 2 puffs into the lungs 2 (two) times daily. 3 Inhaler 3  . Ledipasvir-Sofosbuvir (HARVONI PO) Take 1 tablet by mouth daily.    . Omeprazole 20 MG TBEC Take 1 tablet (20 mg total) by mouth daily before breakfast. 30 each 5  . polyethylene glycol (MIRALAX / GLYCOLAX) packet Take 17 g by mouth daily. 30 packet 5   No current facility-administered medications for this visit.     Allergies as of 03/05/2018  . (No Known Allergies)    ROS:  General: Negative for anorexia, weight loss, fever, chills, fatigue, weakness. ENT:  Negative for hoarseness, difficulty swallowing , nasal congestion. CV: Negative for chest pain, angina, palpitations, dyspnea on exertion, peripheral edema.  Respiratory: Negative for dyspnea at rest, dyspnea on exertion, cough, sputum, wheezing.  GI: See history of present illness. GU:  Negative for dysuria, hematuria, urinary incontinence, urinary frequency, nocturnal urination.  Endo: Negative for unusual weight change.    Physical Examination:   BP 118/73   Pulse 76   Temp 97.7 F (36.5 C) (Oral)   Ht 6' (1.829 m)   Wt 147 lb 6.4 oz (66.9 kg)   BMI 19.99 kg/m   General: Well-nourished, well-developed in no acute distress.  Eyes: No icterus. Mouth: Oropharyngeal mucosa moist and pink. Extremities: No lower extremity edema. No clubbing or deformities. Neuro: Alert and oriented x 4   Skin: Warm and dry, no jaundice.   Psych: Alert and cooperative, normal mood and affect.  Labs:  Lab Results  Component Value Date   CREATININE 0.69 03/02/2018   BUN 7 03/02/2018   NA 139 03/02/2018   K 3.8 03/02/2018   CL 102 03/02/2018   CO2 28 03/02/2018   Lab Results  Component Value Date   ALT 14 (L) 03/02/2018   AST 17 03/02/2018   ALKPHOS 55 03/02/2018   BILITOT 0.8 03/02/2018   Hepatitis B core antibody negative Imaging Studies: No results found.

## 2018-03-05 NOTE — Progress Notes (Signed)
CC'ED TO PCP 

## 2018-03-05 NOTE — Assessment & Plan Note (Signed)
Chronic hepatitis C, genotype 1b, treatment nave. F3/F4 seen on previous elastography.  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  We will plan to manage as cirrhotic as far as management of HCV and hepatoma surveillance.  He is feeling much better. He has more energy.  He has completed 4 weeks of Harvoni.  His HCVRNA level is still detectable but only 20.  He voices compliance with his medication.  He reports that he is taking his omeprazole at the same time as Harvoni on an empty stomach.    We will recheck his HCVRNA level at completion of Harvoni, he will take a total of 12 weeks of medication.  We will see him back in 3 months.

## 2018-03-05 NOTE — Patient Instructions (Signed)
1. Continue taking Harvoni every day. You will need to complete 12 weeks (3 bottles) of Harvoni.  2. We will recheck your labs in 2 months, at the end of Harvoni treatment.  3. Return to the office in 3 months for follow up.

## 2018-03-16 ENCOUNTER — Telehealth: Payer: Self-pay

## 2018-03-16 NOTE — Telephone Encounter (Signed)
T/C from [facility] saying final bottle of Harvoni will be shipped on 03/18/2018 and can expect by 03/19/2018.

## 2018-03-19 NOTE — Telephone Encounter (Signed)
Pt is aware that his next shipment of Harvoni has arrived and he will try to pick it up tomorrow AM. He is aware we will close at 12 noon tomorrow and that we will be closed on Monday.

## 2018-03-20 NOTE — Telephone Encounter (Signed)
Pt picked up Harvoni. Receipt to be scanned.

## 2018-04-08 ENCOUNTER — Other Ambulatory Visit: Payer: Self-pay

## 2018-04-08 DIAGNOSIS — B182 Chronic viral hepatitis C: Secondary | ICD-10-CM

## 2018-05-01 ENCOUNTER — Other Ambulatory Visit (HOSPITAL_COMMUNITY)
Admission: RE | Admit: 2018-05-01 | Discharge: 2018-05-01 | Disposition: A | Discharge status: Home / Self Care | Payer: Self-pay | Source: Ambulatory Visit | Attending: Gastroenterology | Admitting: Gastroenterology

## 2018-05-01 DIAGNOSIS — B182 Chronic viral hepatitis C: Secondary | ICD-10-CM | POA: Insufficient documentation

## 2018-05-02 LAB — HCV RNA QUANT: HCV QUANT: NOT DETECTED [IU]/mL (ref >50)

## 2018-05-05 ENCOUNTER — Other Ambulatory Visit: Payer: Self-pay

## 2018-05-05 ENCOUNTER — Telehealth: Payer: Self-pay | Admitting: Internal Medicine

## 2018-05-05 DIAGNOSIS — B182 Chronic viral hepatitis C: Secondary | ICD-10-CM

## 2018-05-05 NOTE — Telephone Encounter (Signed)
Pt was returning a call. Please call [phone]

## 2018-06-08 ENCOUNTER — Telehealth: Payer: Self-pay | Admitting: Internal Medicine

## 2018-06-08 NOTE — Telephone Encounter (Signed)
RECALL FOR ULTRASOUND 

## 2018-06-08 NOTE — Telephone Encounter (Signed)
Recall mailed 

## 2018-06-09 ENCOUNTER — Telehealth: Payer: Self-pay | Admitting: Gastroenterology

## 2018-06-09 ENCOUNTER — Ambulatory Visit: Payer: Self-pay | Admitting: Gastroenterology

## 2018-06-09 ENCOUNTER — Encounter: Payer: Self-pay | Admitting: Internal Medicine

## 2018-06-09 NOTE — Telephone Encounter (Signed)
PATIENT WAS A NO SHOW AND LETTER SENT  °

## 2018-07-03 ENCOUNTER — Emergency Department (HOSPITAL_COMMUNITY)
Admission: EM | Admit: 2018-07-03 | Discharge: 2018-07-03 | Disposition: A | Discharge status: Home / Self Care | Payer: Self-pay | Attending: Emergency Medicine | Admitting: Emergency Medicine

## 2018-07-03 ENCOUNTER — Other Ambulatory Visit: Payer: Self-pay

## 2018-07-03 DIAGNOSIS — J45909 Unspecified asthma, uncomplicated: Secondary | ICD-10-CM | POA: Insufficient documentation

## 2018-07-03 DIAGNOSIS — Z79899 Other long term (current) drug therapy: Secondary | ICD-10-CM | POA: Insufficient documentation

## 2018-07-03 DIAGNOSIS — R1011 Right upper quadrant pain: Secondary | ICD-10-CM | POA: Insufficient documentation

## 2018-07-03 DIAGNOSIS — Z87891 Personal history of nicotine dependence: Secondary | ICD-10-CM | POA: Insufficient documentation

## 2018-07-03 LAB — DIFFERENTIAL
Basophils Absolute: 0 10*3/uL (ref 0.0–0.1)
Basophils Relative: 1 %
Eosinophils Absolute: 0 10*3/uL (ref 0.0–0.7)
Eosinophils Relative: 1 %
LYMPHS ABS: 0.9 10*3/uL (ref 0.7–4.0)
LYMPHS PCT: 30 %
Monocytes Absolute: 0.4 10*3/uL (ref 0.1–1.0)
Monocytes Relative: 14 %
NEUTROS ABS: 1.7 10*3/uL (ref 1.7–7.7)
NEUTROS PCT: 54 %

## 2018-07-03 LAB — URINALYSIS, ROUTINE W REFLEX MICROSCOPIC
Bacteria, UA: NONE SEEN
Bilirubin Urine: NEGATIVE
GLUCOSE, UA: NEGATIVE mg/dL
Ketones, ur: 20 mg/dL — AB
Leukocytes, UA: NEGATIVE
Nitrite: NEGATIVE
PROTEIN: NEGATIVE mg/dL
SPECIFIC GRAVITY, URINE: 1.025 (ref 1.005–1.030)
pH: 6 (ref 5.0–8.0)

## 2018-07-03 LAB — COMPREHENSIVE METABOLIC PANEL
ALBUMIN: 4.5 g/dL (ref 3.5–5.0)
ALT: 25 U/L (ref 0–44)
AST: 32 U/L (ref 15–41)
Alkaline Phosphatase: 76 U/L (ref 38–126)
Anion gap: 11 (ref 5–15)
BUN: 10 mg/dL (ref 6–20)
CHLORIDE: 102 mmol/L (ref 98–111)
CO2: 22 mmol/L (ref 22–32)
CREATININE: 0.81 mg/dL (ref 0.61–1.24)
Calcium: 9 mg/dL (ref 8.9–10.3)
GFR calc Af Amer: >60 mL/min (ref >60)
Glucose, Bld: 140 mg/dL — ABNORMAL HIGH (ref 70–99)
POTASSIUM: 3.7 mmol/L (ref 3.5–5.1)
SODIUM: 135 mmol/L (ref 135–145)
Total Bilirubin: 1.3 mg/dL — ABNORMAL HIGH (ref 0.3–1.2)
Total Protein: 8.2 g/dL — ABNORMAL HIGH (ref 6.5–8.1)

## 2018-07-03 LAB — CBC WITH DIFFERENTIAL/PLATELET
HEMATOCRIT: 48.3 % (ref 39.0–52.0)
HEMOGLOBIN: 17.6 g/dL — AB (ref 13.0–17.0)
MCH: 34.1 pg — AB (ref 26.0–34.0)
MCHC: 36.4 g/dL — ABNORMAL HIGH (ref 30.0–36.0)
MCV: 93.6 fL (ref 78.0–100.0)
Platelets: 118 10*3/uL — ABNORMAL LOW (ref 150–400)
RBC: 5.16 MIL/uL (ref 4.22–5.81)
RDW: 13 % (ref 11.5–15.5)
WBC: 3.2 10*3/uL — ABNORMAL LOW (ref 4.0–10.5)

## 2018-07-03 LAB — TROPONIN I: Troponin I: <0.03 ng/mL (ref <0.03)

## 2018-07-03 LAB — LIPASE, BLOOD: LIPASE: 26 U/L (ref 11–51)

## 2018-07-03 MED ORDER — ONDANSETRON 4 MG PO TBDP: 4.0000 mg | ORAL_TABLET | Freq: Three times a day (TID) | ORAL | 0 refills | Status: DC | PRN

## 2018-07-03 MED ORDER — MORPHINE SULFATE (PF) 4 MG/ML IV SOLN
4.0000 mg | Freq: Once | INTRAVENOUS | Status: AC
  Administered 2018-07-03: 4 mg via INTRAVENOUS
  Filled 2018-07-03: qty 1

## 2018-07-03 MED ORDER — SODIUM CHLORIDE 0.9 % IV BOLUS
1000.0000 mL | Freq: Once | INTRAVENOUS | Status: AC
  Administered 2018-07-03: 1000 mL via INTRAVENOUS

## 2018-07-03 MED ORDER — ONDANSETRON HCL 4 MG/2ML IJ SOLN
4.0000 mg | Freq: Once | INTRAMUSCULAR | Status: AC
  Administered 2018-07-03: 4 mg via INTRAVENOUS
  Filled 2018-07-03: qty 2

## 2018-07-03 MED ORDER — TRAMADOL HCL 50 MG PO TABS: 50.0000 mg | ORAL_TABLET | Freq: Four times a day (QID) | ORAL | 0 refills | Status: DC | PRN

## 2018-07-03 NOTE — ED Notes (Signed)
Pt was informed that we need a urine sample. Pt states that he can not urinate at this time. Pt was given a urinal. 

## 2018-07-03 NOTE — ED Triage Notes (Signed)
Pt reports having gallbladder attack, has been told multiple times he needs a cholecystectomy. Has had increased abd pain, constipation, and decreased PO intake.

## 2018-07-03 NOTE — Discharge Instructions (Addendum)
As discussed, your labs today are are stable with no obvious source of your pain.  Your gallbladder or liver does not appear infected or inflamed at this time. Plan close followup with your primary doctor if your symptoms persist, or return here if you have any new or worsening symptoms.  Make sure you are drinking increased fluids as discussed.

## 2018-07-03 NOTE — ED Provider Notes (Signed)
[facility] EMERGENCY DEPARTMENT Provider Note   CSN: [id number] Arrival date & time: 07/03/18  1830     History   Chief Complaint Chief Complaint  Patient presents with  . Abdominal Pain    HPI Edgar Roberts is a 57 y.o. male with a history of GERD, Hepatitis C with completion of Harvoni treatment this summer and known polyps in his gallbladder which cause intermittent pain presenting with RUQ pain for the past 5 days in association with decreased appetite, nausea without vomiting, and constipation.  He reports having small hard stools several times this week but not a full bm, also endorsing however he has had a reduced appetite this week.   He had left sided sharp chest pain which was constant the first 2 days of his sx, but gone x 3 days now. He also reports decreased urinary frequency and states he is only passing small amounts with urination.  No dysuria and no hematuria, denies flank pain and no history of kidney stones.  Also no fevers, chills or back pain.  He has had no tx prior to arrival. Pain is constant, but worsened when supine. He states his pain is similar to his prior gallbladder pain.  He has been recommended a cholecystectomy but has deferred due to insurance issues.  HPI  Past Medical History:  Diagnosis Date  . Asthma   . Chronic hepatitis C (HCC)   . COPD (chronic obstructive pulmonary disease) (HCC)   . GERD (gastroesophageal reflux disease)     Patient Active Problem List   Diagnosis Date Noted  . Constipation 08/20/2017  . RUQ pain 02/14/2017  . Cigarette nicotine dependence with nicotine-induced disorder 06/03/2016  . Chronic obstructive pulmonary disease (HCC) 12/04/2015  . Cirrhosis of liver without ascites (HCC)   . Mucosal abnormality of stomach   . Hiatal hernia   . Reflux esophagitis   . Abnormal gallbladder ultrasound 08/17/2015  . Esophageal dysphagia 08/17/2015  . Chest pain 06/19/2015  . GERD (gastroesophageal reflux disease) 06/19/2015    . Chronic hepatitis C (HCC) 05/08/2015  . Abdominal pain, epigastric 05/08/2015  . Loss of weight 05/08/2015  . Nausea without vomiting 05/08/2015    Past Surgical History:  Procedure Laterality Date  . BIOPSY N/A 09/04/2015   Procedure: BIOPSY;  Surgeon: Corbin Ade, MD;  Location: [facility]S;  Service: Endoscopy;  Laterality: N/A;  gastric  . ESOPHAGEAL DILATION N/A 09/04/2015   Procedure:  ESOPHAGEAL DILATION;  Surgeon: Corbin Ade, MD;  Location: [facility];  Service: Endoscopy;  Laterality: N/A;  maloney 56  . ESOPHAGOGASTRODUODENOSCOPY (EGD) WITH PROPOFOL N/A 09/04/2015   Dr. Jena Gauss: mild erosive reflux esophagitis. H.pylori gastritis  . none          Home Medications    Prior to Admission medications   Medication Sig Start Date End Date Taking? Authorizing Provider  albuterol (PROVENTIL HFA;VENTOLIN HFA) 108 (90 Base) MCG/ACT inhaler Inhale 2 puffs into the lungs every 6 (six) hours as needed for wheezing or shortness of breath. 11/05/17   Jacquelin Hawking, PA-C  fluticasone-salmeterol (ADVAIR HFA) 230-21 MCG/ACT inhaler Inhale 2 puffs into the lungs 2 (two) times daily. 11/05/17   Jacquelin Hawking, PA-C  Ledipasvir-Sofosbuvir (HARVONI PO) Take 1 tablet by mouth daily.    [provider]  Omeprazole 20 MG TBEC Take 1 tablet (20 mg total) by mouth daily before breakfast. 12/30/17   Tiffany Kocher, PA-C  ondansetron (ZOFRAN ODT) 4 MG disintegrating tablet Take 1 tablet (4 mg total)  by mouth every 8 (eight) hours as needed for nausea or vomiting. 07/03/18   Burgess Amor, PA-C  polyethylene glycol (MIRALAX / GLYCOLAX) packet Take 17 g by mouth daily. 12/30/17   Tiffany Kocher, PA-C  traMADol (ULTRAM) 50 MG tablet Take 1 tablet (50 mg total) by mouth every 6 (six) hours as needed. 07/03/18   Burgess Amor, PA-C    Family History Family History  Problem Relation Age of Onset  . Cirrhosis Father        deceased  . Heart disease Sister        3 open heart surgeries  . Heart disease  Mother   . Heart disease Other        grandmother  . Colon cancer Neg Hx     Social History Social History   Tobacco Use  . Smoking status: Former Smoker    Packs/day: 2.00    Years: 5.00    Pack years: 10.00    Types: Cigarettes    Start date: 06/06/1976    Last attempt to quit: 10/28/1982    Years since quitting: 35.7  . Smokeless tobacco: Never Used  Substance Use Topics  . Alcohol use: No    Alcohol/week: 0.0 standard drinks    Comment: quit 01/2014  . Drug use: Yes    Types: Marijuana    Comment: everyday     Allergies   Patient has no known allergies.   Review of Systems Review of Systems  Constitutional: Positive for appetite change. Negative for chills and fever.  HENT: Negative for congestion and sore throat.   Eyes: Negative.   Respiratory: Negative for chest tightness and shortness of breath.   Cardiovascular: Positive for chest pain.  Gastrointestinal: Positive for abdominal pain, constipation and nausea. Negative for diarrhea, rectal pain and vomiting.  Genitourinary: Positive for decreased urine volume. Negative for dysuria, flank pain, penile pain and scrotal swelling.  Musculoskeletal: Negative for arthralgias, joint swelling and neck pain.  Skin: Negative.  Negative for rash and wound.  Neurological: Negative for dizziness, weakness, light-headedness, numbness and headaches.  Psychiatric/Behavioral: Negative.      Physical Exam Updated Vital Signs BP 138/83   Pulse 78   Temp 97.6 F (36.4 C) (Temporal)   Resp (!) 21   Ht 6' (1.829 m)   Wt 63.5 kg   SpO2 98%   BMI 18.99 kg/m   Physical Exam  Constitutional: He appears well-developed and well-nourished.  HENT:  Head: Normocephalic and atraumatic.  Eyes: Conjunctivae are normal.  Neck: Normal range of motion.  Cardiovascular: Normal rate, regular rhythm, normal heart sounds and intact distal pulses.  Pulmonary/Chest: Effort normal and breath sounds normal. He has no wheezes.  Abdominal:  Soft. Bowel sounds are normal. There is no hepatosplenomegaly. There is tenderness in the right upper quadrant. There is no rigidity, no guarding, no CVA tenderness and negative Murphy's sign.  Musculoskeletal: Normal range of motion.  Neurological: He is alert.  Skin: Skin is warm and dry.  Psychiatric: He has a normal mood and affect.  Nursing note and vitals reviewed.    ED Treatments / Results  Labs (all labs ordered are listed, but only abnormal results are displayed) Labs Reviewed  URINALYSIS, ROUTINE W REFLEX MICROSCOPIC - Abnormal; Notable for the following components:      Result Value   Color, Urine AMBER (*)    Hgb urine dipstick SMALL (*)    Ketones, ur 20 (*)    All other components within normal  limits  CBC WITH DIFFERENTIAL/PLATELET - Abnormal; Notable for the following components:   WBC 3.2 (*)    Hemoglobin 17.6 (*)    MCH 34.1 (*)    MCHC 36.4 (*)    Platelets 118 (*)    All other components within normal limits  COMPREHENSIVE METABOLIC PANEL - Abnormal; Notable for the following components:   Glucose, Bld 140 (*)    Total Protein 8.2 (*)    Total Bilirubin 1.3 (*)    All other components within normal limits  TROPONIN I  LIPASE, BLOOD  DIFFERENTIAL    EKG EKG Interpretation  Date/Time:  Friday July 03 2018 19:10:03 EDT Ventricular Rate:  88 PR Interval:    QRS Duration: 89 QT Interval:  343 QTC Calculation: 415 R Axis:   89 Text Interpretation:  Sinus rhythm Anteroseptal infarct, old since last tracing no significant change Confirmed by Eber Hong ([id number]) on 07/03/2018 7:42:51 PM   Radiology No results found.  Procedures Procedures (including critical care time)  Medications Ordered in ED Medications  sodium chloride 0.9 % bolus 1,000 mL (0 mLs Intravenous Stopped 07/03/18 2021)  morphine 4 MG/ML injection 4 mg (4 mg Intravenous Given 07/03/18 1926)  ondansetron (ZOFRAN) injection 4 mg (4 mg Intravenous Given 07/03/18 1927)     Initial  Impression / Assessment and Plan / ED Course  I have reviewed the triage vital signs and the nursing notes.  Pertinent labs & imaging results that were available during my care of the patient were reviewed by me and considered in my medical decision making (see chart for details).     Pt with acute on chronic ruq pain with known gallbladder polyps but stable labs today.  Recent tx for Hep C, lft's normal  Today.  His abdominal exam is benign and he has no flank pain, doubt kidney stones, abd soft, no increased tympany, normal bowel sounds, doubt obstruction.. Serial exam prior to dc - non acute abd. Pt comfortable at time of dc.  Return precautions discussed.   Final Clinical Impressions(s) / ED Diagnoses   Final diagnoses:  Right upper quadrant abdominal pain    ED Discharge Orders         Ordered    ondansetron (ZOFRAN ODT) 4 MG disintegrating tablet  Every 8 hours PRN     07/03/18 2220    traMADol (ULTRAM) 50 MG tablet  Every 6 hours PRN     07/03/18 2220           Burgess Amor, PA-C 07/03/18 2233    Eber Hong, MD 07/03/18 2325

## 2018-07-06 ENCOUNTER — Encounter: Payer: Self-pay | Admitting: Physician Assistant

## 2018-07-06 ENCOUNTER — Ambulatory Visit: Payer: Self-pay | Admitting: Physician Assistant

## 2018-07-06 VITALS — BP 126/92 | HR 104 | Temp 97.9°F | Ht 72.0 in | Wt 138.0 lb

## 2018-07-06 DIAGNOSIS — R1011 Right upper quadrant pain: Secondary | ICD-10-CM

## 2018-07-06 DIAGNOSIS — J449 Chronic obstructive pulmonary disease, unspecified: Secondary | ICD-10-CM

## 2018-07-06 NOTE — Progress Notes (Signed)
BP (!) 126/92 (BP Location: Left Arm, Patient Position: Sitting, Cuff Size: Normal)   Pulse (!) 104   Temp 97.9 F (36.6 C) (Other (Comment))   Ht 6' (1.829 m)   Wt 138 lb (62.6 kg)   SpO2 97%   BMI 18.72 kg/m    Subjective:    Patient ID: Edgar Roberts, male    DOB: 09/14/1961, 57 y.o.   MRN: [id number]  HPI: Edgar Roberts is a 57 y.o. male presenting on 07/06/2018 for COPD ("started feeling bad last Monday, went to [facility] ER Friday, diagnosed with dehydration ")   HPI   Chief Complaint  Patient presents with  . COPD    "started feeling bad last Monday, went to [facility] ER Friday, diagnosed with dehydration "   ER note reviewed from Friday- he was diagnosed with RUQ abd pain.  Pt says he is eating and drinking but doesn't have a whole lot of appetite.  He is moving his bowels.   Pt no-showed to his GI appointment 06/09/18.  He has appoinemtnt rescheduled to  07/27/18  Pt is sill smoking MJ.  He denies alcohol.    Pt is still feeling puny from when he went to the ER.   He has not yet filled the tramadol and zofran prescriptions.   Relevant past medical, surgical, family and social history reviewed and updated as indicated. Interim medical history since our last visit reviewed. Allergies and medications reviewed and updated.  Review of Systems  Constitutional: Positive for unexpected weight change. Negative for appetite change, chills, diaphoresis, fatigue and fever.  HENT: Positive for dental problem. Negative for congestion, drooling, ear pain, facial swelling, hearing loss, mouth sores, sneezing, sore throat, trouble swallowing and voice change.   Eyes: Negative for pain, discharge, redness, itching and visual disturbance.  Respiratory: Negative for cough, choking, shortness of breath and wheezing.   Cardiovascular: Negative for chest pain, palpitations and leg swelling.  Gastrointestinal: Positive for abdominal pain. Negative for blood in stool, constipation, diarrhea and vomiting.   Endocrine: Negative for cold intolerance, heat intolerance and polydipsia.  Genitourinary: Negative for decreased urine volume, dysuria and hematuria.  Musculoskeletal: Negative for arthralgias, back pain and gait problem.  Skin: Negative for rash.  Allergic/Immunologic: Negative for environmental allergies.  Neurological: Negative for seizures, syncope, light-headedness and headaches.  Hematological: Negative for adenopathy.  Psychiatric/Behavioral: Negative for agitation, dysphoric mood and suicidal ideas. The patient is not nervous/anxious.     Per HPI unless specifically indicated above     Objective:    BP (!) 126/92 (BP Location: Left Arm, Patient Position: Sitting, Cuff Size: Normal)   Pulse (!) 104   Temp 97.9 F (36.6 C) (Other (Comment))   Ht 6' (1.829 m)   Wt 138 lb (62.6 kg)   SpO2 97%   BMI 18.72 kg/m   Wt Readings from Last 3 Encounters:  07/06/18 138 lb (62.6 kg)  07/03/18 140 lb (63.5 kg)  03/05/18 147 lb 6.4 oz (66.9 kg)     Orthostatic VS for the past 24 hrs:  BP- Lying Pulse- Lying BP- Sitting Pulse- Sitting BP- Standing at 0 minutes Pulse- Standing at 0 minutes  07/06/18 1101 (!) 141/92 85 (!) 151/97 97 (!) 152/101 108     Physical Exam  Constitutional: He is oriented to person, place, and time. He appears well-developed and well-nourished.  HENT:  Head: Normocephalic and atraumatic.  Neck: Neck supple.  Cardiovascular: Normal rate and regular rhythm.  Pulmonary/Chest: Effort normal and breath sounds  normal. He has no wheezes.  Abdominal: Soft. Bowel sounds are normal. There is no hepatosplenomegaly. There is tenderness in the right upper quadrant. There is no rigidity, no rebound, no guarding and no CVA tenderness.  Musculoskeletal: He exhibits no edema.  Lymphadenopathy:    He has no cervical adenopathy.  Neurological: He is alert and oriented to person, place, and time.  Skin: Skin is warm and dry.  Psychiatric: He has a normal mood and  affect. His behavior is normal.  Vitals reviewed.       Assessment & Plan:    Encounter Diagnoses  Name Primary?  . Chronic obstructive pulmonary disease, unspecified COPD type (HCC) Yes  . Right upper quadrant abdominal pain     -will Reorder RUQ [place] (done last in March) -pt counseled to Get rx from ER filled as they will help him to feel better -Pt to Go to GI appointment as scheduled later this month -pt to check on his [facility] dates -pt to continue current inhalers -pt to follow up for COPD in 4 months.  He is to RTO sooner prn worsening pain or new symptoms

## 2018-07-06 NOTE — Patient Instructions (Signed)
Financial Counselor- [phone]  

## 2018-07-10 ENCOUNTER — Ambulatory Visit (HOSPITAL_COMMUNITY)
Admission: RE | Admit: 2018-07-10 | Discharge: 2018-07-10 | Disposition: A | Discharge status: Home / Self Care | Payer: Self-pay | Source: Ambulatory Visit | Attending: Physician Assistant | Admitting: Physician Assistant

## 2018-07-10 DIAGNOSIS — R1011 Right upper quadrant pain: Secondary | ICD-10-CM

## 2018-07-10 DIAGNOSIS — K824 Cholesterolosis of gallbladder: Secondary | ICD-10-CM | POA: Insufficient documentation

## 2018-07-14 ENCOUNTER — Other Ambulatory Visit: Payer: Self-pay

## 2018-07-14 DIAGNOSIS — B1920 Unspecified viral hepatitis C without hepatic coma: Secondary | ICD-10-CM

## 2018-07-18 IMAGING — US US ABDOMEN LIMITED
1 series · 14 of 25 positions shown · non-contrast
Comparison: Ultrasound June 26, 2015.

CLINICAL DATA: Right upper quadrant abdominal pain.

EXAM:
ULTRASOUND ABDOMEN LIMITED RIGHT UPPER QUADRANT

[Series 1: us abdomen limited · 0.16mm/px · 14 of 86 slices shown]
[im 1/86]
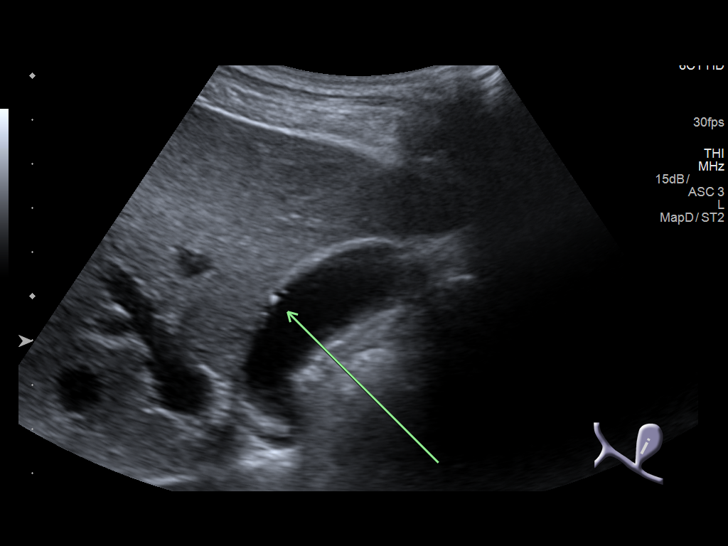
[im 8/86]
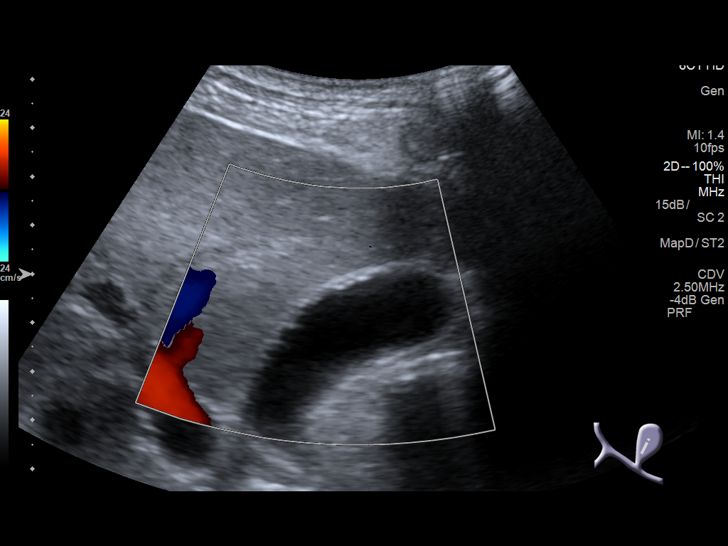
[im 15/86]
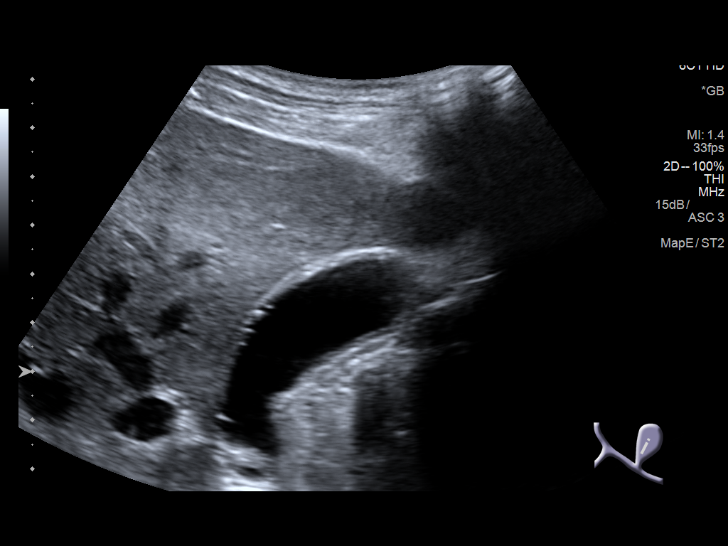
[im 22/86]
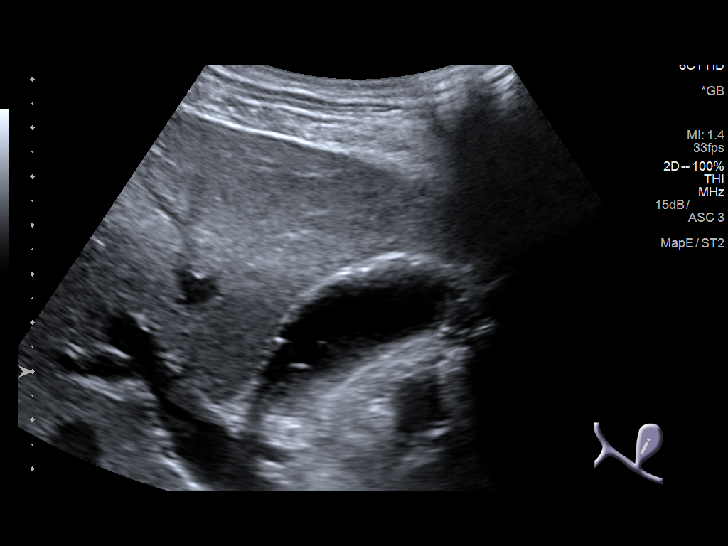
[im 29/86]
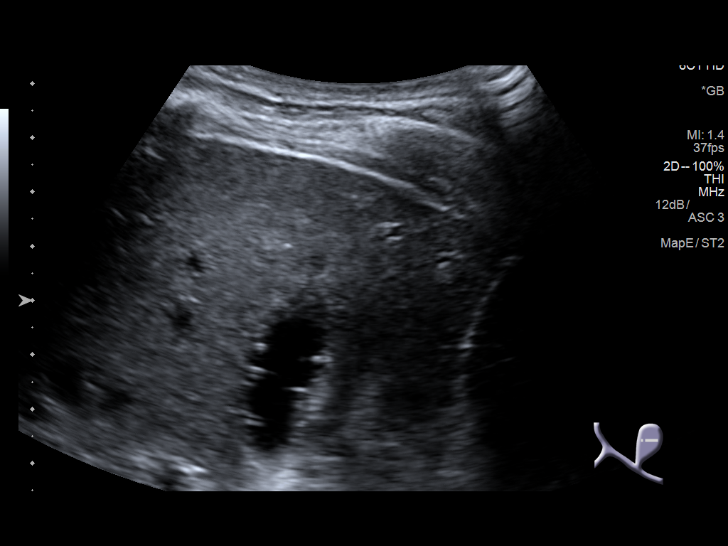
[im 32/86]
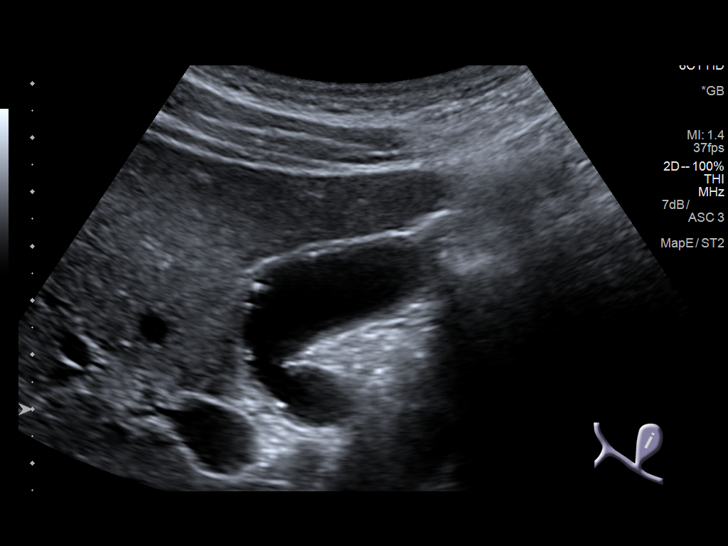
[im 39/86]
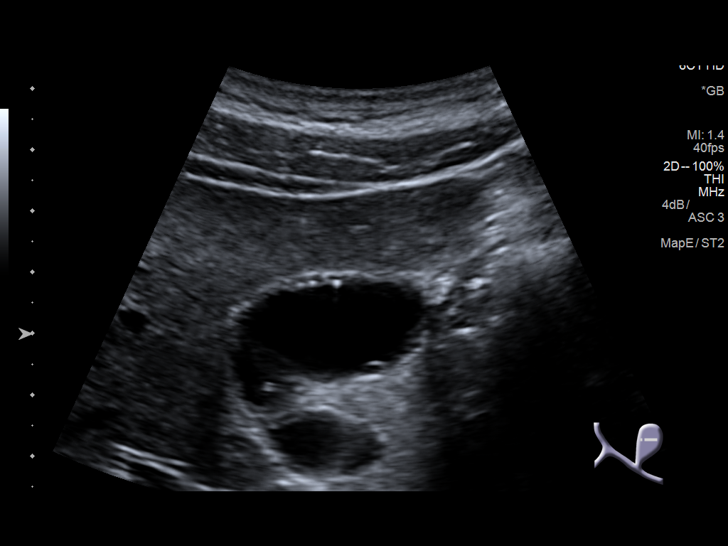
[im 47/86]
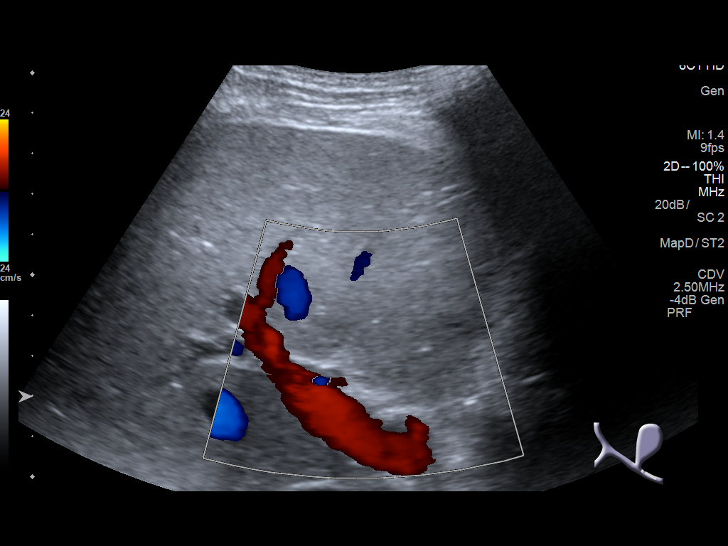
[im 54/86]
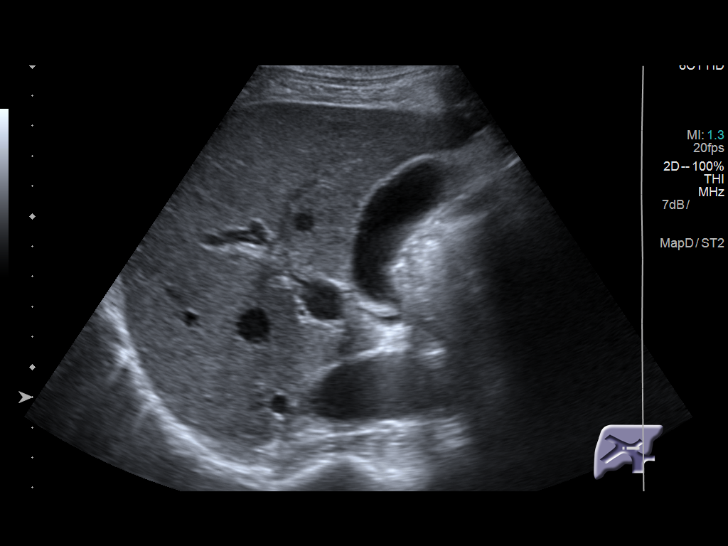
[im 57/86]
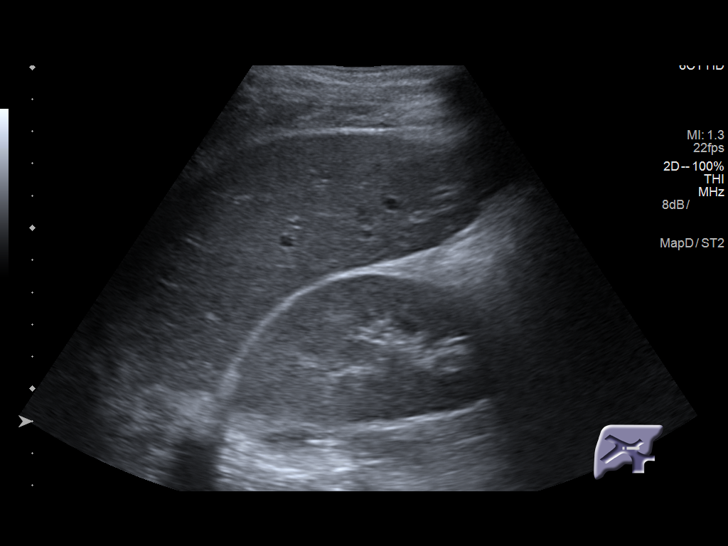
[im 64/86]
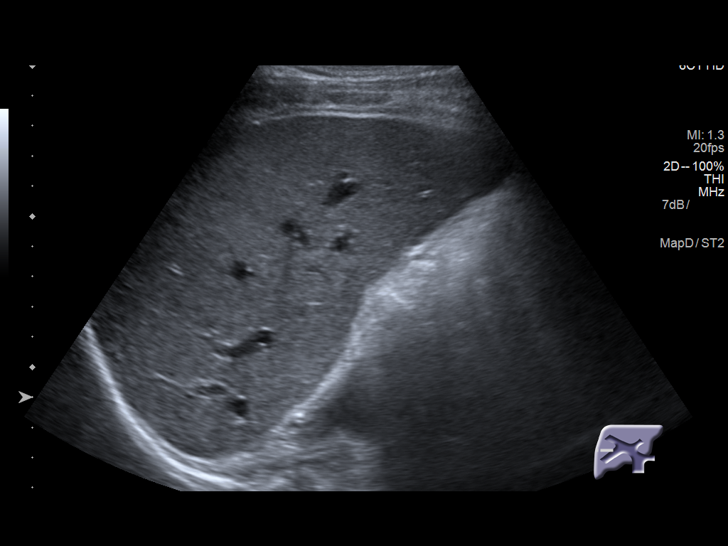
[im 71/86]
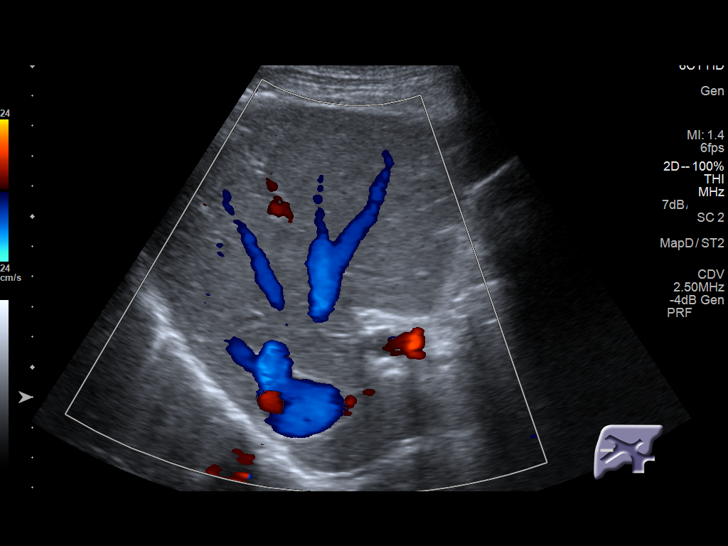
[im 78/86]
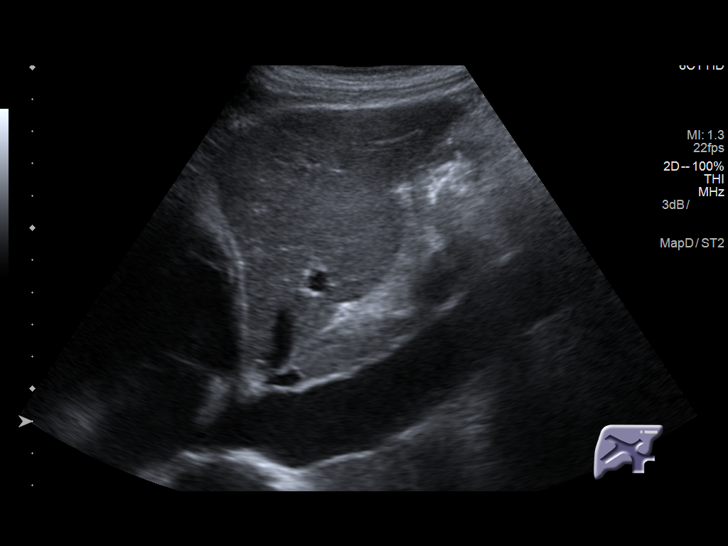
[im 86/86]
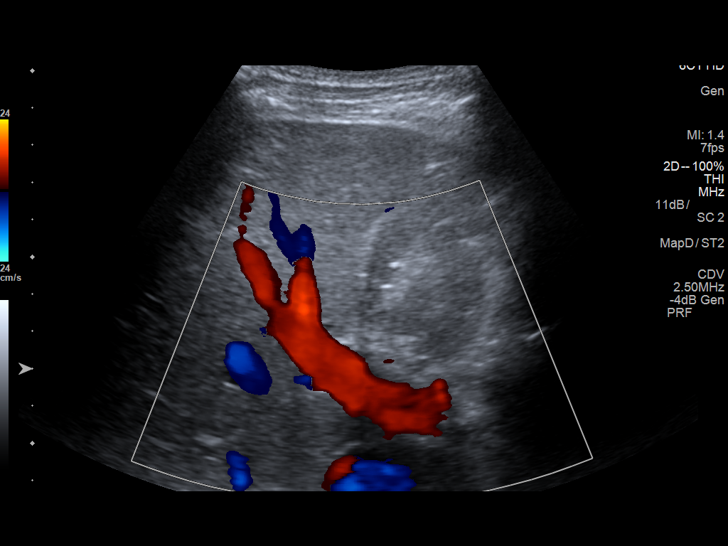

[14 of 25 positions shown; findings below may reference images not displayed]

FINDINGS: Gallbladder:

No gallstones or wall thickening visualized. No sonographic Murphy
sign noted by sonographer. There are again noted nonshadowing and
non mobile echogenic foci along the gallbladder wall which may
represent adenomyomatosis.

Common bile duct:

Diameter: 3.4 mm which is within normal limits.

Liver:

No focal lesion identified. Within normal limits in parenchymal
echogenicity. Portal vein is patent on color Doppler imaging with
normal direction of blood flow towards the liver.
IMPRESSION: Findings consistent with adenomyomatosis of the gallbladder wall. No
other significant abnormality seen in the right upper quadrant of
the abdomen.

## 2018-07-27 ENCOUNTER — Encounter: Payer: Self-pay | Admitting: Gastroenterology

## 2018-07-27 ENCOUNTER — Ambulatory Visit (INDEPENDENT_AMBULATORY_CARE_PROVIDER_SITE_OTHER): Payer: Self-pay | Admitting: Gastroenterology

## 2018-07-27 VITALS — BP 119/80 | HR 92 | Temp 97.4°F | Ht 72.0 in | Wt 146.8 lb

## 2018-07-27 DIAGNOSIS — B182 Chronic viral hepatitis C: Secondary | ICD-10-CM

## 2018-07-27 DIAGNOSIS — R1011 Right upper quadrant pain: Secondary | ICD-10-CM

## 2018-07-27 NOTE — Assessment & Plan Note (Signed)
Continues to have intermittent right upper quadrant pain, last episode a couple of weeks ago.  This is been going on for 2 to 3 years.  Right upper quadrant ultrasound with adenomyomatosis or cholesterolosis of the gallbladder wall, appearing stable.  Would encourage him to follow through with seeing surgery for consideration of cholecystectomy.  Also recommend liver biopsy at time of cholecystectomy to determine fibrosis/cirrhosis scores.

## 2018-07-27 NOTE — Progress Notes (Signed)
Primary Care Physician: Jacquelin Hawking, PA-C  Primary Gastroenterologist:  Roetta Sessions, MD   Chief Complaint  Patient presents with  . Hepatitis C    f/u    HPI: Edgar Roberts is a 57 y.o. male here for follow-up of chronic hepatitis C, genotype 1b.  He had F3 and F4 fibrosis scores previously.  Treated with 12 weeks of Harvoni. Also history of right upper quadrant abdominal pain off and on for 3 years, previously recommended cholecystectomy along with liver biopsy but he has postponed on numerous occasions in part due to lack of insurance.  History of adenomyomatosis of the gallbladder, questionable gallstones.  HCV RNA not detected on May 01, 2018 posttreatment.  Given FibroSure with stage III fibrosis/macro inflammatory activity grade A1 to A2 we plan hepatoma surveillance chronically.   First week of September patient developed recurrent right upper quadrant pain associated with decreased appetite, nausea, fever.  He became dehydrated, diminished urination.  ED for evaluation.  Labs as outlined below.  Went back a couple of days later for scheduled right upper quadrant ultrasound. He has a small echogenic foci along the wall of the gallbladder with mild comet tail artifact remain, consistent with cholesterolosis or adenomyomatosis.  No gallbladder wall thickening. Liver unremarkable.   Patient states his pain settled down.  He is been able to eat pretty well the last week.  Bowel movements are better.  No melena or rectal bleeding.  Heartburn well controlled.  Previously declined colonoscopy.    Current Outpatient Medications  Medication Sig Dispense Refill  . albuterol (PROVENTIL HFA;VENTOLIN HFA) 108 (90 Base) MCG/ACT inhaler Inhale 2 puffs into the lungs every 6 (six) hours as needed for wheezing or shortness of breath. 3 Inhaler 3  . fluticasone-salmeterol (ADVAIR HFA) 230-21 MCG/ACT inhaler Inhale 2 puffs into the lungs 2 (two) times daily. 3 Inhaler 3  . Omeprazole  20 MG TBEC Take 1 tablet (20 mg total) by mouth daily before breakfast. 30 each 5  . polyethylene glycol (MIRALAX / GLYCOLAX) packet Take 17 g by mouth daily. 30 packet 5   No current facility-administered medications for this visit.     Allergies as of 07/27/2018  . (No Known Allergies)    ROS:  General: Negative for anorexia, weight loss, fever, chills, fatigue, weakness. ENT: Negative for hoarseness, difficulty swallowing , nasal congestion. CV: Negative for chest pain, angina, palpitations, dyspnea on exertion, peripheral edema.  Respiratory: Negative for dyspnea at rest, dyspnea on exertion, cough, sputum, wheezing.  GI: See history of present illness. GU:  Negative for dysuria, hematuria, urinary incontinence, urinary frequency, nocturnal urination.  Endo: Negative for unusual weight change.    Physical Examination:   BP 119/80   Pulse 92   Temp (!) 97.4 F (36.3 C) (Oral)   Ht 6' (1.829 m)   Wt 146 lb 12.8 oz (66.6 kg)   BMI 19.91 kg/m   General: Well-nourished, well-developed in no acute distress.  Eyes: No icterus. Mouth: Oropharyngeal mucosa moist and pink , no lesions erythema or exudate. Lungs: Clear to auscultation bilaterally.  Heart: Regular rate and rhythm, no murmurs rubs or gallops.  Abdomen: Bowel sounds are normal, nontender, nondistended, no hepatosplenomegaly or masses, no abdominal bruits or hernia , no rebound or guarding.   Extremities: No lower extremity edema. No clubbing or deformities. Neuro: Alert and oriented x 4   Skin: Warm and dry, no jaundice.   Psych: Alert and cooperative, normal mood and affect.  Labs:  Lab Results  Component Value Date   CREATININE 0.81 07/03/2018   BUN 10 07/03/2018   NA 135 07/03/2018   K 3.7 07/03/2018   CL 102 07/03/2018   CO2 22 07/03/2018   Lab Results  Component Value Date   ALT 25 07/03/2018   AST 32 07/03/2018   ALKPHOS 76 07/03/2018   BILITOT 1.3 (H) 07/03/2018   Lab Results  Component Value  Date   WBC 3.2 (L) 07/03/2018   HGB 17.6 (H) 07/03/2018   HCT 48.3 07/03/2018   MCV 93.6 07/03/2018   PLT 118 (L) 07/03/2018    Imaging Studies: US Abdomen Limited Ruq  Result Date: 07/10/2018 CLINICAL DATA:  Upper abdominal pain chronic hepatitis-C. EXAM: ULTRASOUND ABDOMEN LIMITED RIGHT UPPER QUADRANT COMPARISON:  January 02 2018 and July 17, 2017 right upper quadrant ultrasound examination is FINDINGS: Gallbladder: Small echogenic foci along the wall of the gallbladder with mild comet tail artifact remain, consistent with cholesterolosis or adenomyomatosis. There are no echogenic foci which move and shadow as is expected with gallstones. There is no gallbladder wall thickening or pericholecystic fluid. No sonographic Murphy sign noted by sonographer. Common bile duct: Diameter: 4 mm. No intrahepatic or extrahepatic biliary duct dilatation. Liver: No focal lesion identified. Within normal limits in parenchymal echogenicity. Portal vein is patent on color Doppler imaging with normal direction of blood flow towards the liver. IMPRESSION: Evidence of adenomyomatosis or cholesterolosis of the gallbladder wall remains. Appearance is essentially stable compared to prior studies. No gallstones evident. No gallbladder wall thickening or pericholecystic fluid. Study otherwise unremarkable. Electronically Signed   By: Bretta Bang III M.D.   On: 07/10/2018 14:46

## 2018-07-27 NOTE — Assessment & Plan Note (Signed)
Negative HCVRNA posttreatment.  Recheck HCVRNA, which will be 12 weeks posttreatment.  update date CBC, Cmet at that time to follow-up on recent ED labs.

## 2018-07-27 NOTE — Patient Instructions (Signed)
1. Please have your labs done in few weeks.  2. We will work on surgery referral.  3. Call with recurrent abdominal pain.

## 2018-07-27 NOTE — Progress Notes (Signed)
CC'D TO PCP °

## 2018-08-25 ENCOUNTER — Other Ambulatory Visit (HOSPITAL_COMMUNITY)
Admission: RE | Admit: 2018-08-25 | Discharge: 2018-08-25 | Disposition: A | Discharge status: Home / Self Care | Payer: Self-pay | Source: Ambulatory Visit | Attending: Gastroenterology | Admitting: Gastroenterology

## 2018-08-25 DIAGNOSIS — R1011 Right upper quadrant pain: Secondary | ICD-10-CM | POA: Insufficient documentation

## 2018-08-25 LAB — CBC WITH DIFFERENTIAL/PLATELET
Abs Immature Granulocytes: 0.01 10*3/uL (ref 0.00–0.07)
BASOS ABS: 0.1 10*3/uL (ref 0.0–0.1)
BASOS PCT: 1 %
EOS ABS: 0.4 10*3/uL (ref 0.0–0.5)
EOS PCT: 8 %
HCT: 49.4 % (ref 39.0–52.0)
Hemoglobin: 16.8 g/dL (ref 13.0–17.0)
IMMATURE GRANULOCYTES: 0 %
Lymphocytes Relative: 40 %
Lymphs Abs: 1.8 10*3/uL (ref 0.7–4.0)
MCH: 33 pg (ref 26.0–34.0)
MCHC: 34 g/dL (ref 30.0–36.0)
MCV: 97.1 fL (ref 80.0–100.0)
Monocytes Absolute: 0.3 10*3/uL (ref 0.1–1.0)
Monocytes Relative: 7 %
Neutro Abs: 2.1 10*3/uL (ref 1.7–7.7)
Neutrophils Relative %: 44 %
PLATELETS: 219 10*3/uL (ref 150–400)
RBC: 5.09 MIL/uL (ref 4.22–5.81)
RDW: 13.2 % (ref 11.5–15.5)
WBC: 4.7 10*3/uL (ref 4.0–10.5)
nRBC: 0 % (ref 0.0–0.2)

## 2018-08-25 LAB — COMPREHENSIVE METABOLIC PANEL
ALBUMIN: 4.6 g/dL (ref 3.5–5.0)
ALT: 14 U/L (ref 0–44)
ANION GAP: 8 (ref 5–15)
AST: 18 U/L (ref 15–41)
Alkaline Phosphatase: 66 U/L (ref 38–126)
BILIRUBIN TOTAL: 0.9 mg/dL (ref 0.3–1.2)
BUN: 6 mg/dL (ref 6–20)
CHLORIDE: 102 mmol/L (ref 98–111)
CO2: 28 mmol/L (ref 22–32)
Calcium: 9.5 mg/dL (ref 8.9–10.3)
Creatinine, Ser: 0.8 mg/dL (ref 0.61–1.24)
GFR calc Af Amer: >60 mL/min (ref >60)
Glucose, Bld: 115 mg/dL — ABNORMAL HIGH (ref 70–99)
POTASSIUM: 4.6 mmol/L (ref 3.5–5.1)
Sodium: 138 mmol/L (ref 135–145)
Total Protein: 8.3 g/dL — ABNORMAL HIGH (ref 6.5–8.1)

## 2018-08-26 LAB — HCV RNA QUANT: HCV QUANT: NOT DETECTED [IU]/mL (ref >50)

## 2018-09-07 ENCOUNTER — Other Ambulatory Visit: Payer: Self-pay

## 2018-09-07 DIAGNOSIS — R1011 Right upper quadrant pain: Secondary | ICD-10-CM

## 2018-09-07 DIAGNOSIS — B1920 Unspecified viral hepatitis C without hepatic coma: Secondary | ICD-10-CM

## 2018-09-07 DIAGNOSIS — R932 Abnormal findings on diagnostic imaging of liver and biliary tract: Secondary | ICD-10-CM

## 2018-09-07 DIAGNOSIS — K7460 Unspecified cirrhosis of liver: Secondary | ICD-10-CM

## 2018-09-08 ENCOUNTER — Other Ambulatory Visit: Payer: Self-pay

## 2018-09-08 DIAGNOSIS — B1920 Unspecified viral hepatitis C without hepatic coma: Secondary | ICD-10-CM

## 2018-11-05 ENCOUNTER — Ambulatory Visit: Payer: Self-pay | Admitting: Physician Assistant

## 2018-11-05 ENCOUNTER — Other Ambulatory Visit: Payer: Self-pay | Admitting: Physician Assistant

## 2018-11-05 ENCOUNTER — Encounter: Payer: Self-pay | Admitting: Physician Assistant

## 2018-11-05 VITALS — BP 100/70 | HR 79 | Temp 97.9°F | Ht 72.0 in | Wt 148.5 lb

## 2018-11-05 DIAGNOSIS — Z1211 Encounter for screening for malignant neoplasm of colon: Secondary | ICD-10-CM

## 2018-11-05 DIAGNOSIS — J449 Chronic obstructive pulmonary disease, unspecified: Secondary | ICD-10-CM

## 2018-11-05 DIAGNOSIS — E785 Hyperlipidemia, unspecified: Secondary | ICD-10-CM

## 2018-11-05 DIAGNOSIS — Z125 Encounter for screening for malignant neoplasm of prostate: Secondary | ICD-10-CM

## 2018-11-05 NOTE — Progress Notes (Signed)
BP 100/70 (BP Location: Left Arm, Patient Position: Sitting, Cuff Size: Normal)   Pulse 79   Temp 97.9 F (36.6 C)   Ht 6' (1.829 m)   Wt 148 lb 8 oz (67.4 kg)   SpO2 95%   BMI 20.14 kg/m    Subjective:    Patient ID: Edgar Roberts, male    DOB: 12/04/1960, 58 y.o.   MRN: [id number]  HPI: Edgar Roberts is a 58 y.o. male presenting on 11/05/2018 for COPD   HPI   Pt is still smoking MJ but is not smoking cigarettes nor drinking. He says he is doing pretty well.  He has been seen by GI for hepatitis since his last OV here at the [facility].  Relevant past medical, surgical, family and social history reviewed and updated as indicated. Interim medical history since our last visit reviewed. Allergies and medications reviewed and updated.   Current Outpatient Medications:  .  albuterol (PROVENTIL HFA;VENTOLIN HFA) 108 (90 Base) MCG/ACT inhaler, Inhale 2 puffs into the lungs every 6 (six) hours as needed for wheezing or shortness of breath., Disp: 3 Inhaler, Rfl: 3 .  fluticasone-salmeterol (ADVAIR HFA) 230-21 MCG/ACT inhaler, Inhale 2 puffs into the lungs 2 (two) times daily., Disp: 3 Inhaler, Rfl: 3 .  Omeprazole 20 MG TBEC, Take 1 tablet (20 mg total) by mouth daily before breakfast., Disp: 30 each, Rfl: 5 .  polyethylene glycol (MIRALAX / GLYCOLAX) packet, Take 17 g by mouth daily., Disp: 30 packet, Rfl: 5    Review of Systems  Constitutional: Negative for appetite change, chills, diaphoresis, fatigue, fever and unexpected weight change.  HENT: Negative for congestion, dental problem, drooling, ear pain, facial swelling, hearing loss, mouth sores, sneezing, sore throat, trouble swallowing and voice change.   Eyes: Negative for pain, discharge, redness, itching and visual disturbance.  Respiratory: Negative for cough, choking, shortness of breath and wheezing.   Cardiovascular: Negative for chest pain, palpitations and leg swelling.  Gastrointestinal: Negative for abdominal pain,  blood in stool, constipation, diarrhea and vomiting.  Endocrine: Negative for cold intolerance, heat intolerance and polydipsia.  Genitourinary: Negative for decreased urine volume, dysuria and hematuria.  Musculoskeletal: Negative for arthralgias, back pain and gait problem.  Skin: Negative for rash.  Allergic/Immunologic: Negative for environmental allergies.  Neurological: Negative for seizures, syncope, light-headedness and headaches.  Hematological: Negative for adenopathy.  Psychiatric/Behavioral: Negative for agitation, dysphoric mood and suicidal ideas. The patient is not nervous/anxious.     Per HPI unless specifically indicated above     Objective:    BP 100/70 (BP Location: Left Arm, Patient Position: Sitting, Cuff Size: Normal)   Pulse 79   Temp 97.9 F (36.6 C)   Ht 6' (1.829 m)   Wt 148 lb 8 oz (67.4 kg)   SpO2 95%   BMI 20.14 kg/m   Wt Readings from Last 3 Encounters:  11/05/18 148 lb 8 oz (67.4 kg)  07/27/18 146 lb 12.8 oz (66.6 kg)  07/06/18 138 lb (62.6 kg)    Physical Exam Vitals signs reviewed.  Constitutional:      Appearance: He is well-developed.  HENT:     Head: Normocephalic and atraumatic.  Neck:     Musculoskeletal: Neck supple.  Cardiovascular:     Rate and Rhythm: Normal rate and regular rhythm.  Pulmonary:     Effort: Pulmonary effort is normal.     Breath sounds: Normal breath sounds. No wheezing.  Abdominal:     General: Bowel sounds are  normal.     Palpations: Abdomen is soft.     Tenderness: There is no abdominal tenderness.  Lymphadenopathy:     Cervical: No cervical adenopathy.  Skin:    General: Skin is warm and dry.  Neurological:     Mental Status: He is alert and oriented to person, place, and time.  Psychiatric:        Behavior: Behavior normal.         Assessment & Plan:    Encounter Diagnoses  Name Primary?  . Chronic obstructive pulmonary disease, unspecified COPD type (HCC) Yes  . Hyperlipidemia,  unspecified hyperlipidemia type   . Screening for prostate cancer   . Screening for colon cancer      -Check lipids, PSA, cmp.  Will call pt with results -pt was given iFOBT for colon cancer screening -pt to continue with GI per their recomendations -pt to Follow up 4 months.  RTO sooner prn

## 2018-11-17 ENCOUNTER — Other Ambulatory Visit: Payer: Self-pay

## 2018-11-17 DIAGNOSIS — B1920 Unspecified viral hepatitis C without hepatic coma: Secondary | ICD-10-CM

## 2019-01-03 IMAGING — US US ABDOMEN LIMITED
1 series · 14 of 25 positions shown · non-contrast
Comparison: 07/17/2017

CLINICAL DATA: Right upper quadrant pain for several months

EXAM:
ULTRASOUND ABDOMEN LIMITED RIGHT UPPER QUADRANT

[Series 1: us abdomen limited · 0.15mm/px · 14 of 44 slices shown]
[im 1/44]
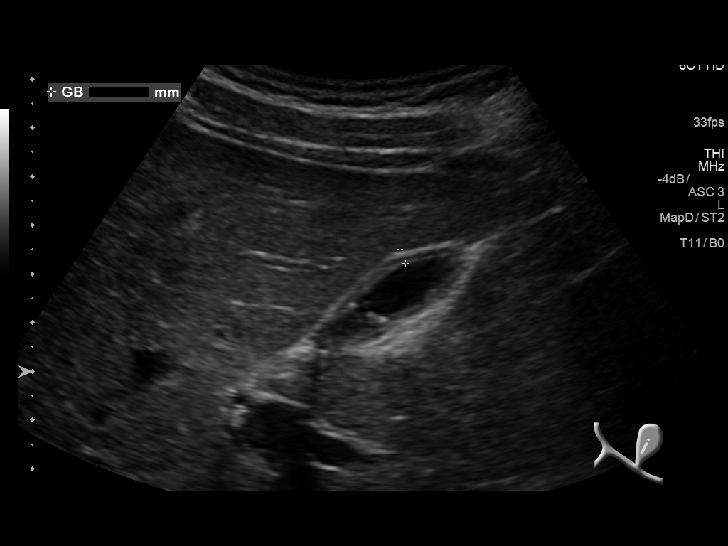
[im 4/44]
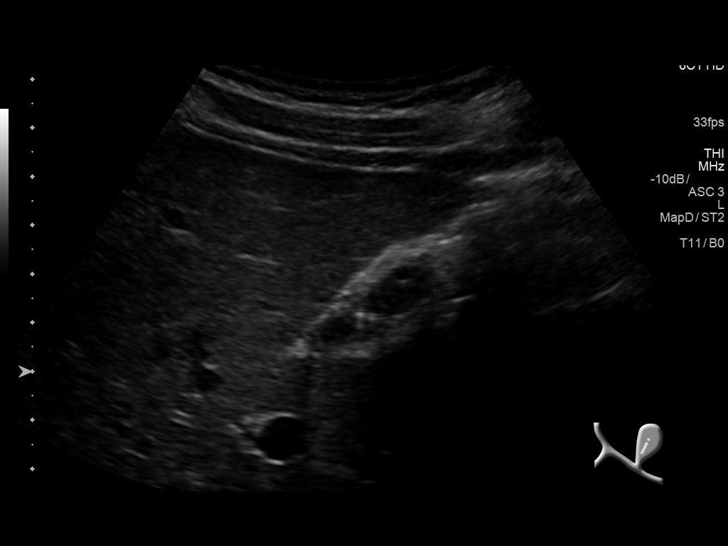
[im 8/44]
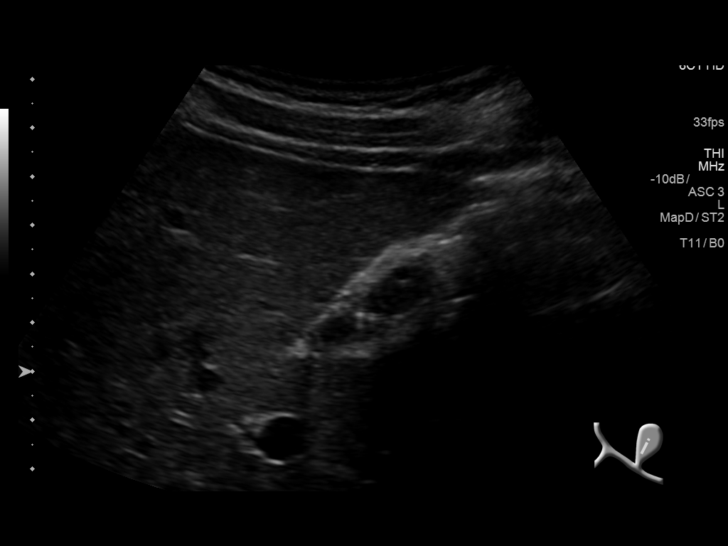
[im 11/44]
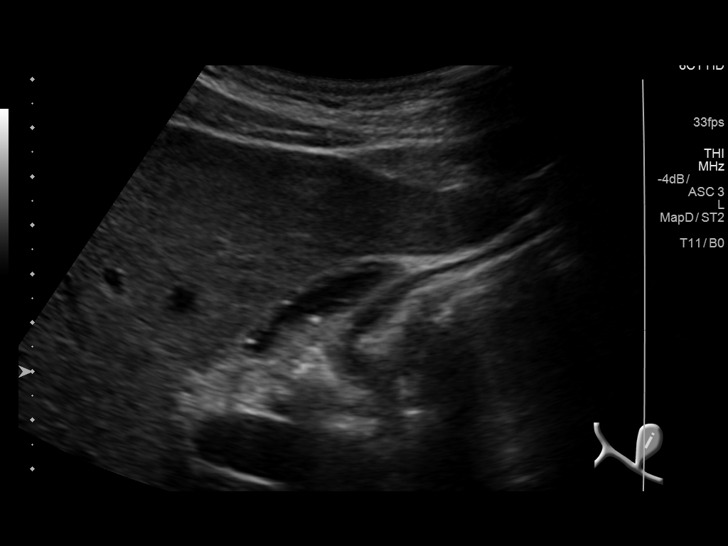
[im 15/44]
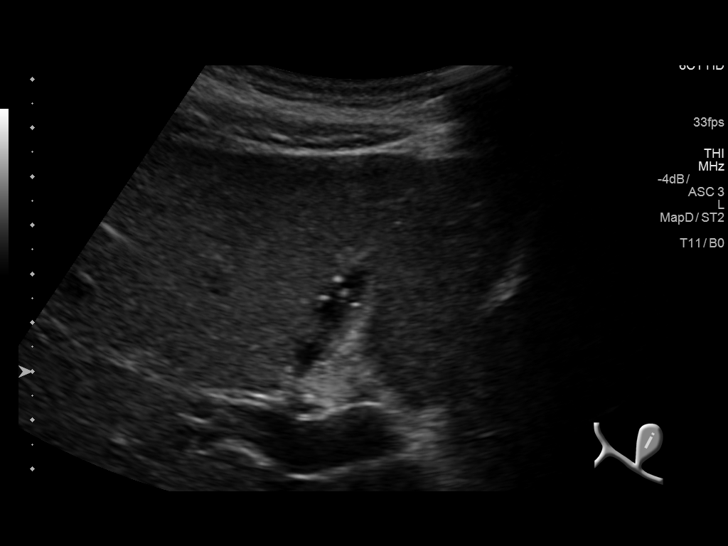
[im 17/44]
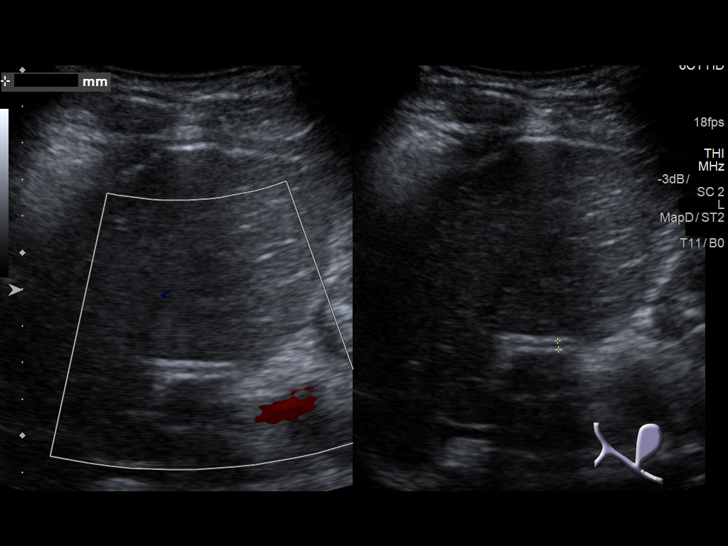
[im 20/44]
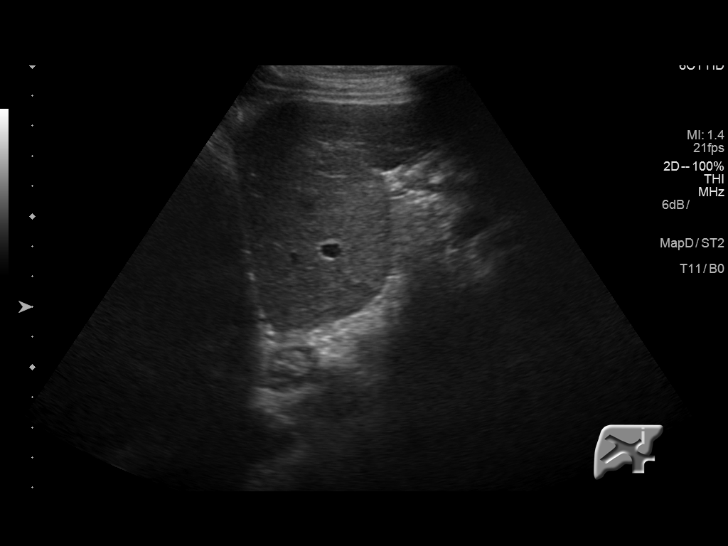
[im 24/44]
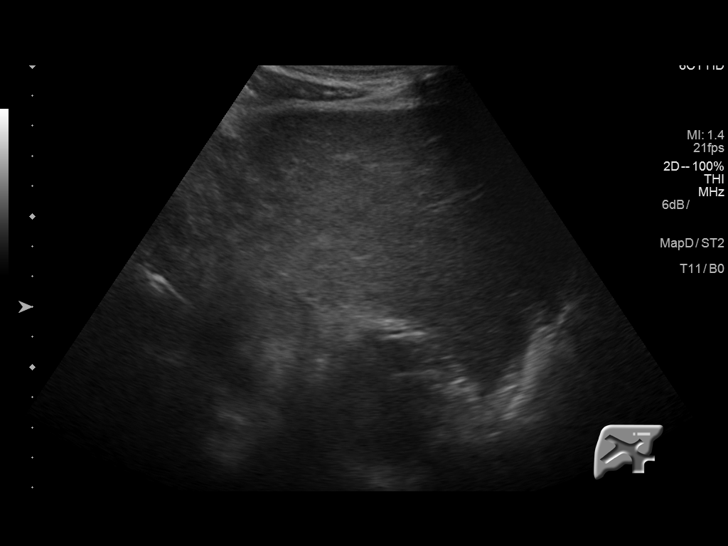
[im 27/44]
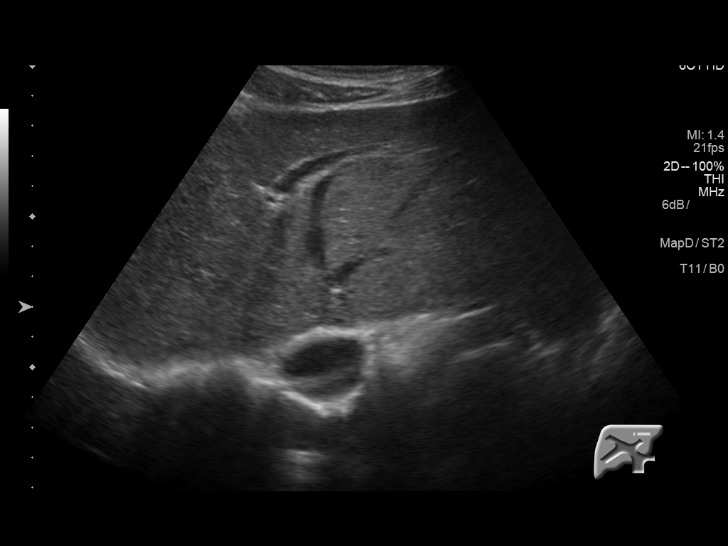
[im 29/44]
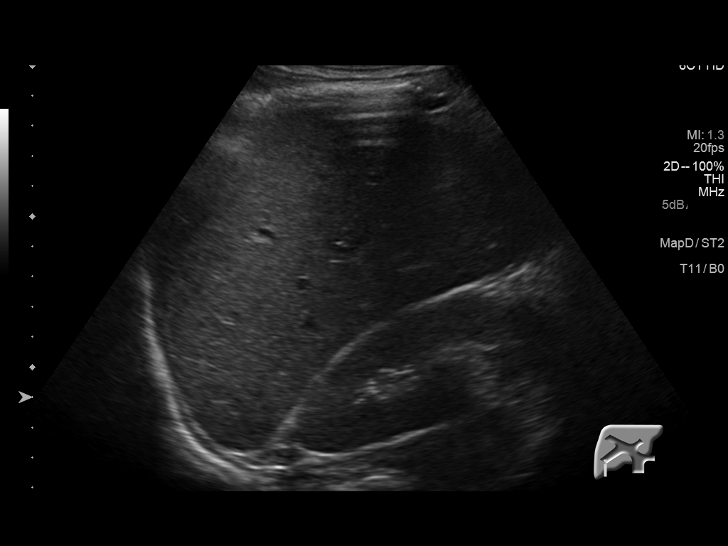
[im 33/44]
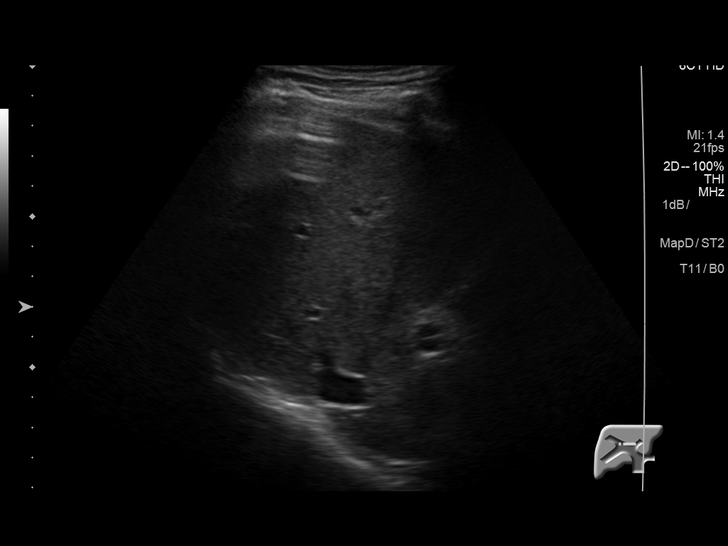
[im 36/44]
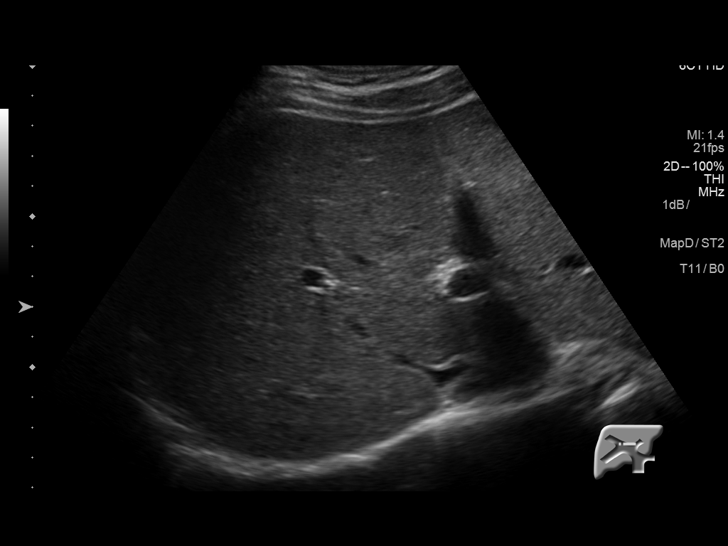
[im 40/44]
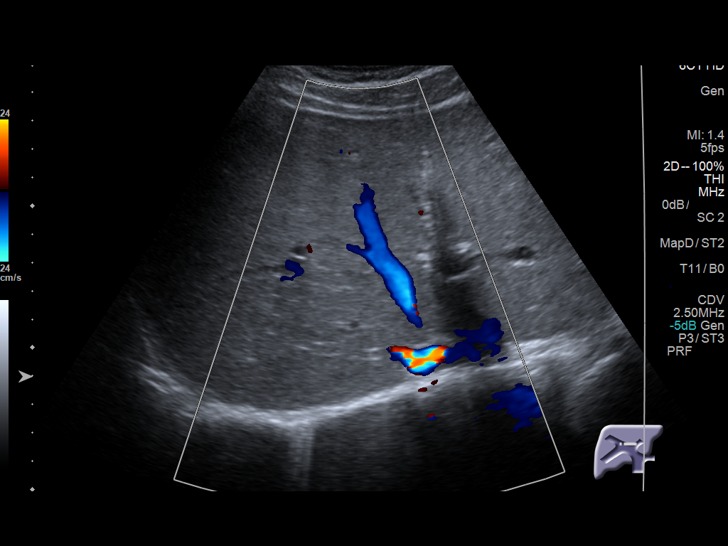
[im 44/44]
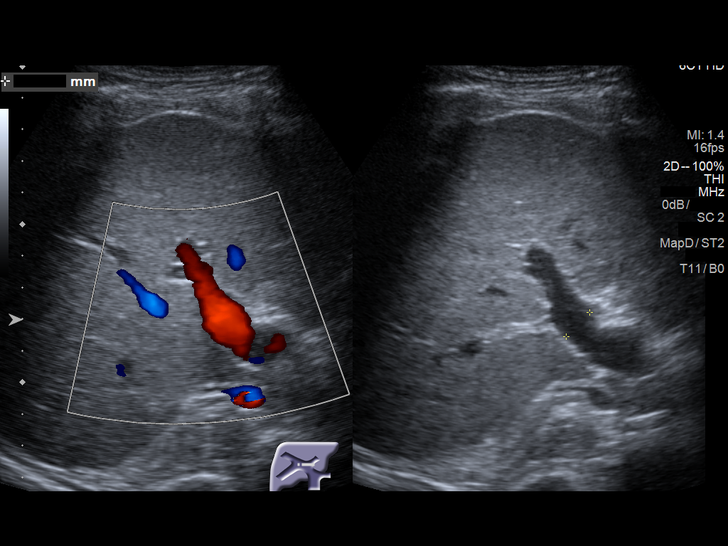

[14 of 25 positions shown; findings below may reference images not displayed]

FINDINGS: Gallbladder:

Gallbladder is decompressed with wall thickening to 3 mm although
accentuated by the decompression. The small hyperechoic foci within
the gallbladder wall are again identified. A few small gallbladder
polyps are noted as well.

Common bile duct:

Diameter: 2 mm

Liver:

No focal lesion identified. Within normal limits in parenchymal
echogenicity. Portal vein is patent on color Doppler imaging with
normal direction of blood flow towards the liver.
IMPRESSION: Stable changes in the gallbladder.  No acute abnormality noted.

## 2019-03-08 ENCOUNTER — Ambulatory Visit: Payer: Self-pay | Admitting: Physician Assistant

## 2019-03-08 ENCOUNTER — Encounter: Payer: Self-pay | Admitting: Physician Assistant

## 2019-03-08 DIAGNOSIS — F1290 Cannabis use, unspecified, uncomplicated: Secondary | ICD-10-CM

## 2019-03-08 DIAGNOSIS — J449 Chronic obstructive pulmonary disease, unspecified: Secondary | ICD-10-CM

## 2019-03-08 MED ORDER — ALBUTEROL SULFATE HFA 108 (90 BASE) MCG/ACT IN AERS: 2.0000 | INHALATION_SPRAY | Freq: Four times a day (QID) | RESPIRATORY_TRACT | 3 refills | Status: DC | PRN

## 2019-03-08 MED ORDER — FLUTICASONE-SALMETEROL 230-21 MCG/ACT IN AERO: 2.0000 | INHALATION_SPRAY | Freq: Two times a day (BID) | RESPIRATORY_TRACT | 3 refills | Status: DC

## 2019-03-08 NOTE — Progress Notes (Signed)
   There were no vitals taken for this visit.   Subjective:    Patient ID: Edgar Roberts, male    DOB: 16-Oct-1961, 58 y.o.   MRN: [id number]  HPI: Edgar Roberts is a 58 y.o. male presenting on 03/08/2019 for No chief complaint on file.   HPI   This is a telemedicine visit due to coronavirus pandemic.  It is via Telephone as pt does not have a smartphone  I connected with  [name] on 03/08/19 by a video enabled telemedicine application and verified that I am speaking with the correct person using two identifiers.   I discussed the limitations of evaluation and management by telemedicine. The patient expressed understanding and agreed to proceed.   Pt did not get labs drawn after January appointment as instructed  Pt Ran out of his adviar last week  He is not drinking.  He says he feels great.  He is Still smoking [employer].  He is Not smoking cigarettes  Per CDC guidelines, He is staying home mostly.  He has a bandana for covering his face but doesn't always wear it when he goes out.    Relevant past medical, surgical, family and social history reviewed and updated as indicated. Interim medical history since our last visit reviewed. Allergies and medications reviewed and updated.  Review of Systems  Per HPI unless specifically indicated above     Objective:    There were no vitals taken for this visit.  Wt Readings from Last 3 Encounters:  11/05/18 148 lb 8 oz (67.4 kg)  07/27/18 146 lb 12.8 oz (66.6 kg)  07/06/18 138 lb (62.6 kg)    Physical Exam Pulmonary:     Effort: Pulmonary effort is normal.     Comments: Pt is talking in complete sentences and laughing without dyspnea Neurological:     Mental Status: He is alert and oriented to person, place, and time.  Psychiatric:        Attention and Perception: Attention normal.        Speech: Speech normal.        Behavior: Behavior is cooperative.         Assessment & Plan:    Encounter Diagnoses  Name  Primary?  . Chronic obstructive pulmonary disease, unspecified COPD type (HCC) Yes  . Marijuana use     -Renewed patient assistance forms so he can get back on his advai -pt to continue curent prescribed medications -Recommended wearing a face covering when he goes into stores -pt to Follow up 4 months.  Pt to contact office sooner prn

## 2019-06-28 ENCOUNTER — Encounter: Payer: Self-pay | Admitting: Physician Assistant

## 2019-06-28 ENCOUNTER — Ambulatory Visit: Payer: Self-pay | Admitting: Physician Assistant

## 2019-06-28 DIAGNOSIS — Z125 Encounter for screening for malignant neoplasm of prostate: Secondary | ICD-10-CM

## 2019-06-28 DIAGNOSIS — F1290 Cannabis use, unspecified, uncomplicated: Secondary | ICD-10-CM

## 2019-06-28 DIAGNOSIS — J449 Chronic obstructive pulmonary disease, unspecified: Secondary | ICD-10-CM

## 2019-06-28 DIAGNOSIS — M549 Dorsalgia, unspecified: Secondary | ICD-10-CM

## 2019-06-28 DIAGNOSIS — E785 Hyperlipidemia, unspecified: Secondary | ICD-10-CM

## 2019-06-28 MED ORDER — IBUPROFEN 600 MG PO TABS: 600.0000 mg | ORAL_TABLET | Freq: Three times a day (TID) | ORAL | 2 refills | Status: DC | PRN

## 2019-06-28 NOTE — Progress Notes (Signed)
There were no vitals taken for this visit.   Subjective:    Patient ID: Edgar Roberts, male    DOB: 1961-09-12, 58 y.o.   MRN: [id number]  HPI: Edgar Roberts is a 58 y.o. male presenting on 06/28/2019 for No chief complaint on file.   HPI   This is a telemedicine appointment due to coronavirus pandemic.  It is via Telephone as pt does not have a video enabled device  I connected with  Ralene Bathe on 06/28/19 by a video enabled telemedicine application and verified that I am speaking with the correct person using two identifiers.   I discussed the limitations of evaluation and management by telemedicine. The patient expressed understanding and agreed to proceed.   Pt is at home.  Provider is at office.     Pt didn't get labs last week because he fell and got hurt [place] last week and hurt back and shoulder.    He is using icyhot.    Not using any APAP or IBU.  He says he tripped while walking backwards.  No LOC.   He says he has no limited movement due to the fall.  He says he is just sore and achey.   Pt says that other than the soreness, he is doing really well.  He says that "it's amazing" how good he feels now that he is no longer drinking.  Pt is not smoking cigarettes but sometimes smokes MJ.  He is no longer drinking etoh.    No DOE while on medications.    He says that he does notice that he gets winded if he runs out of his inhalers.       Relevant past medical, surgical, family and social history reviewed and updated as indicated. Interim medical history since our last visit reviewed. Allergies and medications reviewed and updated.   Current Outpatient Medications:  .  albuterol (VENTOLIN HFA) 108 (90 Base) MCG/ACT inhaler, Inhale 2 puffs into the lungs every 6 (six) hours as needed for wheezing or shortness of breath., Disp: 3 Inhaler, Rfl: 3 .  fluticasone-salmeterol (ADVAIR HFA) 230-21 MCG/ACT inhaler, Inhale 2 puffs into the lungs 2 (two) times daily., Disp: 3  Inhaler, Rfl: 3 .  Omeprazole 20 MG TBEC, Take 1 tablet (20 mg total) by mouth daily before breakfast., Disp: 30 each, Rfl: 5    Review of Systems  Per HPI unless specifically indicated above     Objective:    There were no vitals taken for this visit.  Wt Readings from Last 3 Encounters:  11/05/18 148 lb 8 oz (67.4 kg)  07/27/18 146 lb 12.8 oz (66.6 kg)  07/06/18 138 lb (62.6 kg)    Physical Exam Pulmonary:     Effort: No respiratory distress.     Comments: No audible wheezing Neurological:     Mental Status: He is alert and oriented to person, place, and time.  Psychiatric:        Attention and Perception: Attention normal.        Mood and Affect: Mood normal.        Speech: Speech normal.        Behavior: Behavior is cooperative.           Assessment & Plan:     Encounter Diagnoses  Name Primary?  . Chronic obstructive pulmonary disease, unspecified COPD type ([place]) Yes  . Acute back pain, unspecified back location, unspecified back pain laterality   . Marijuana use   .  Hyperlipidemia, unspecified hyperlipidemia type   . Screening for prostate cancer      -rx ibu 600  - take with food.  Pt counseled to use ice/heat to help aches from his fall -pt to Get labs done.  Orders re-entered due to orders over 6 months old.  He will be called with results -pt to follow up in 4 months.  He is to contact office sooner prn

## 2019-07-11 IMAGING — US US ABDOMEN LIMITED
1 series · 14 of 25 positions shown · non-contrast
Comparison: January 02, 2018 and July 17, 2017 right upper
quadrant ultrasound examination is

CLINICAL DATA: Upper abdominal pain chronic hepatitis-C.

EXAM:
ULTRASOUND ABDOMEN LIMITED RIGHT UPPER QUADRANT

[Series 1: us abdomen limited · 0.18mm/px · 14 of 63 slices shown]
[im 1/63]
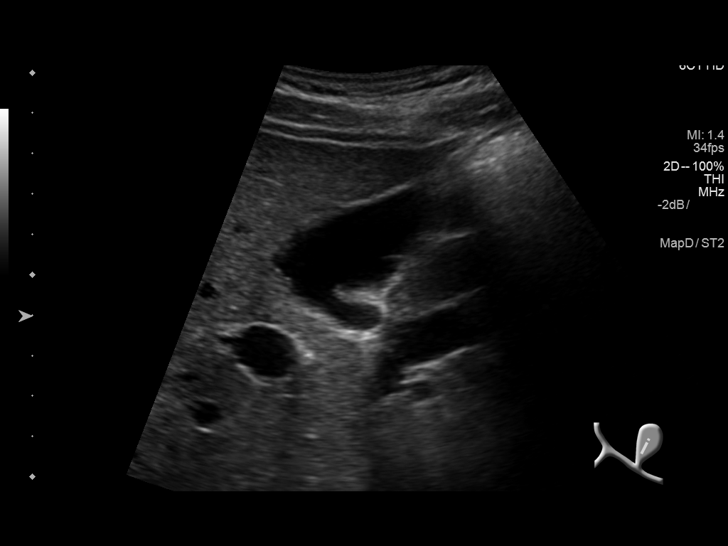
[im 6/63]
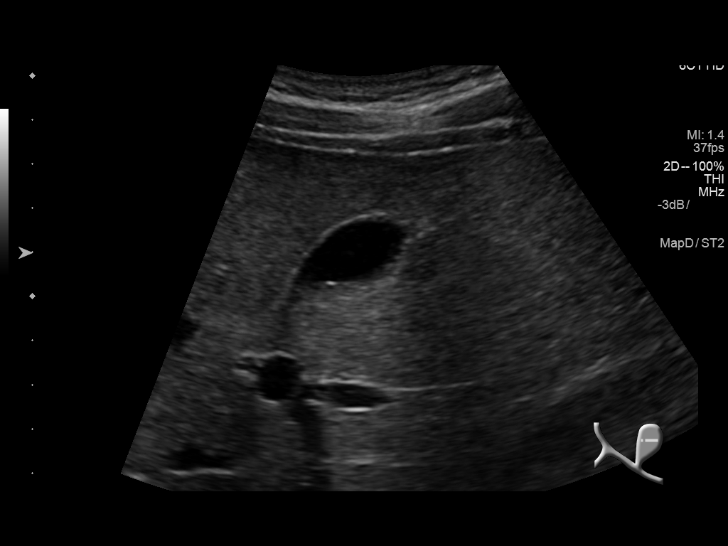
[im 11/63]
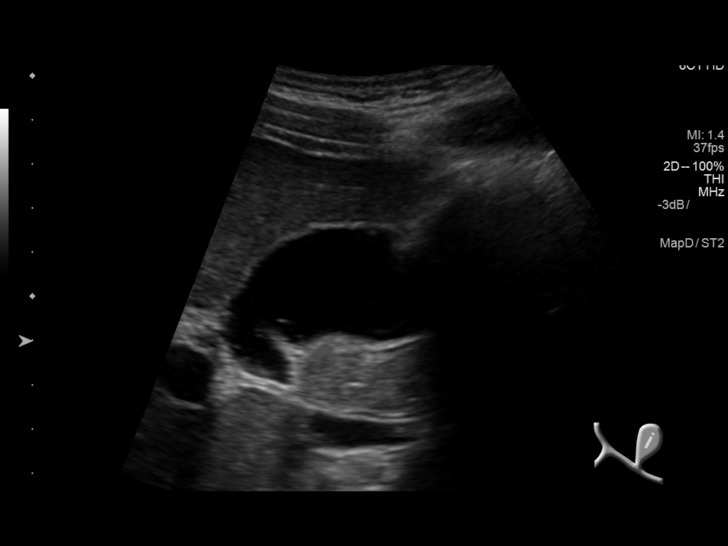
[im 16/63]
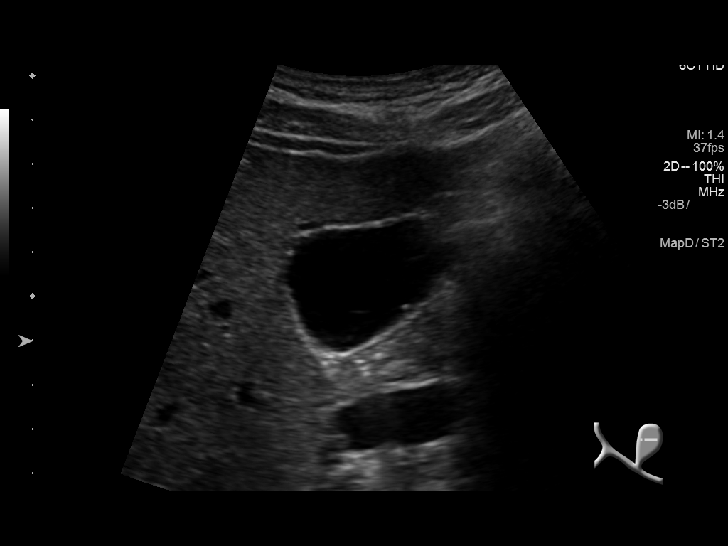
[im 21/63]
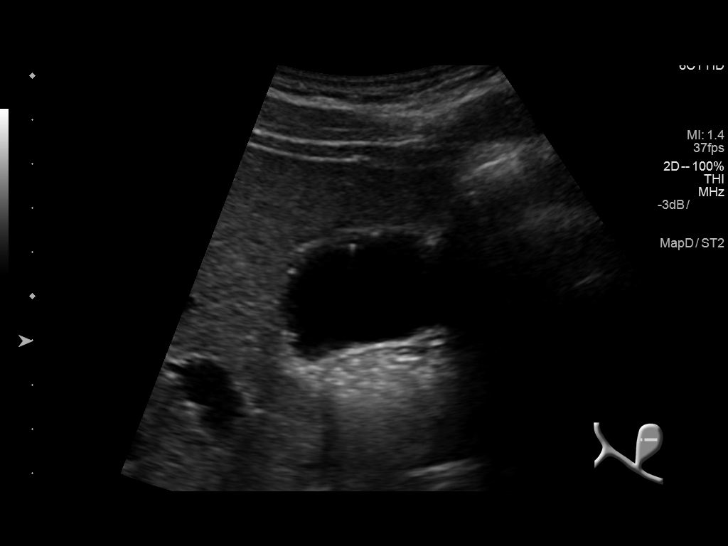
[im 24/63]
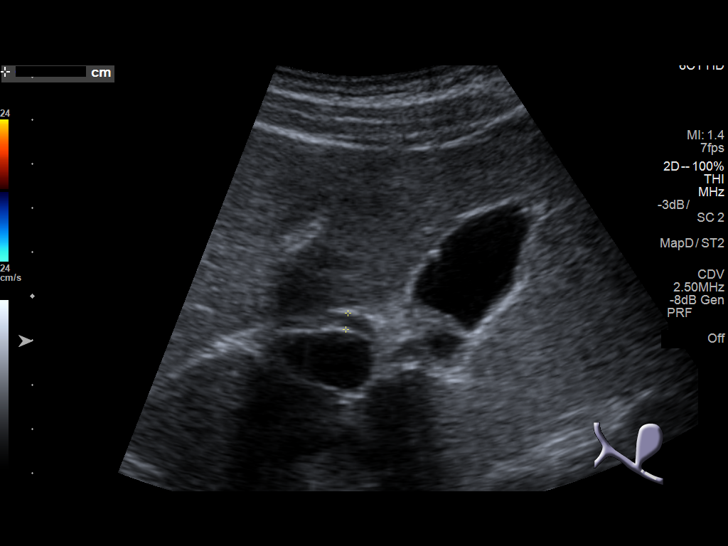
[im 29/63]
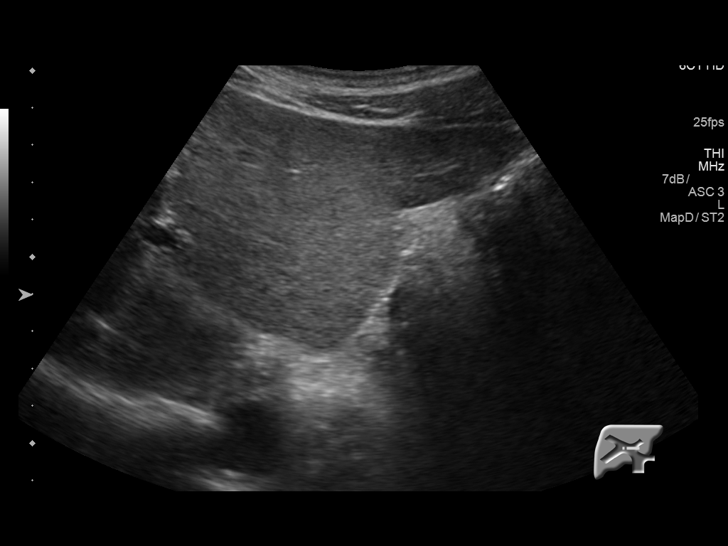
[im 34/63]
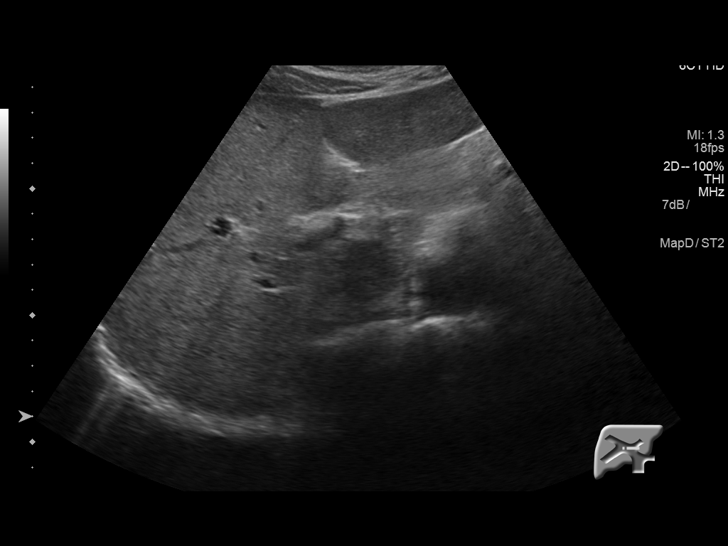
[im 39/63]
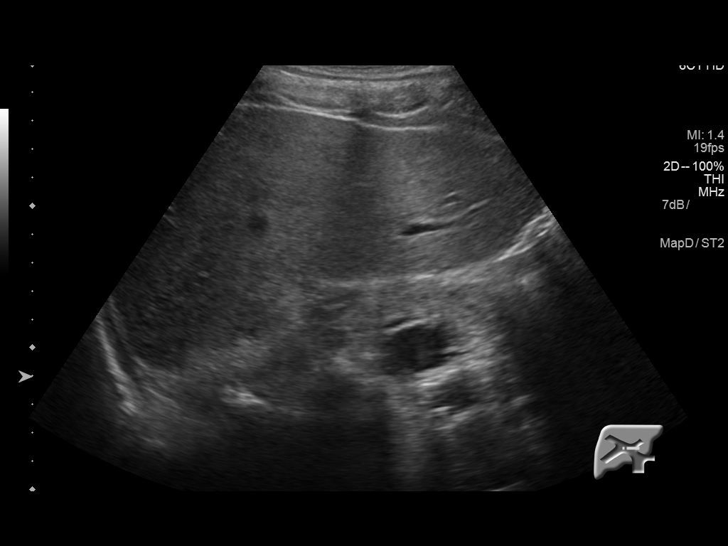
[im 42/63]
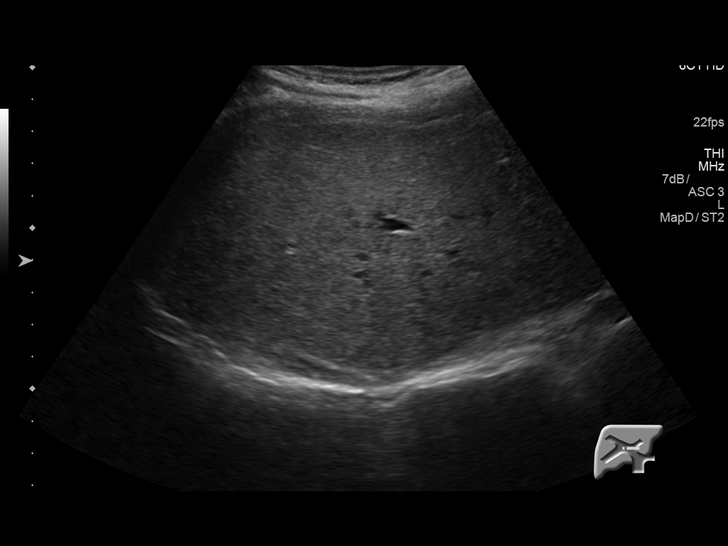
[im 47/63]
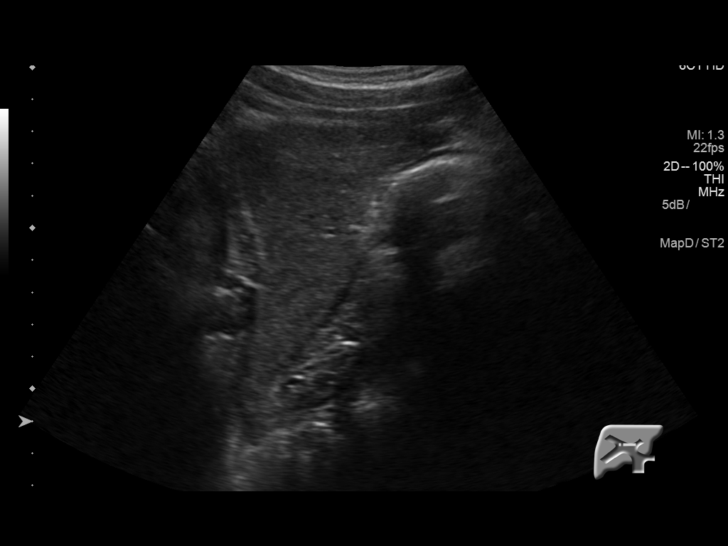
[im 52/63]
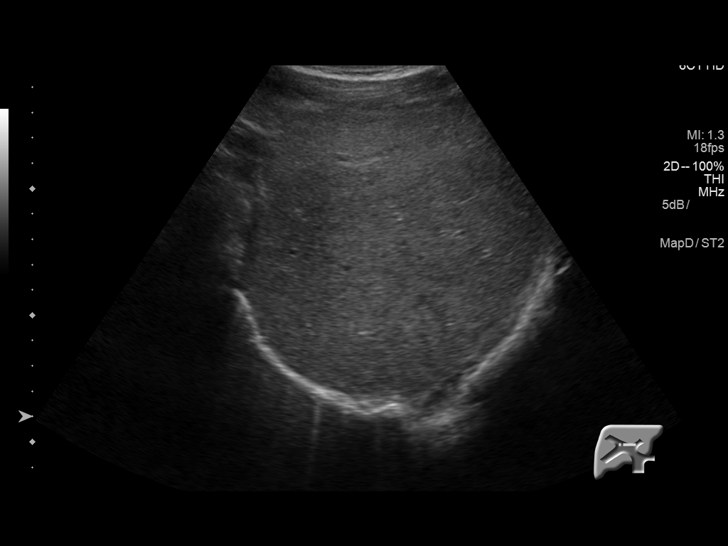
[im 57/63]
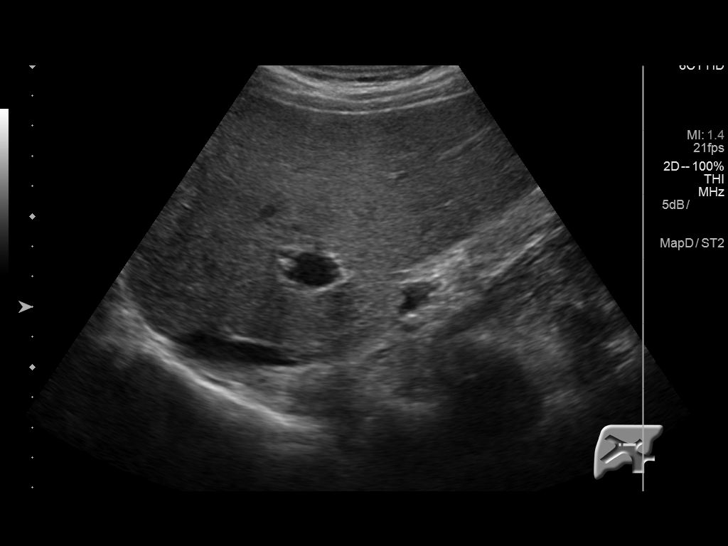
[im 63/63]
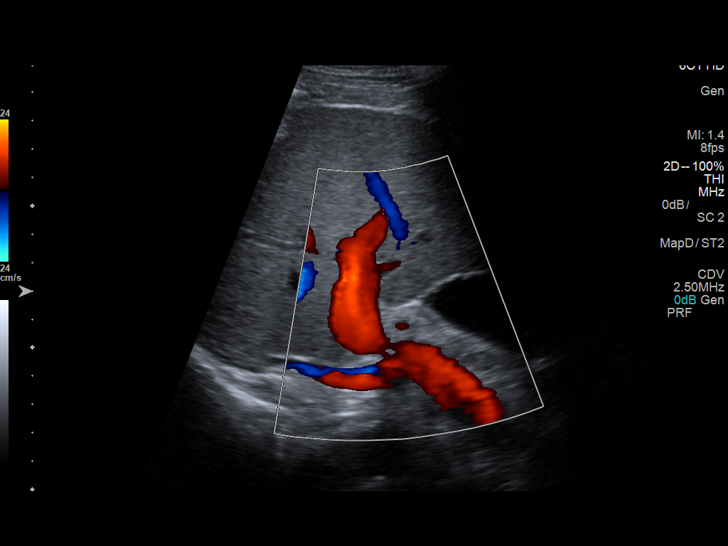

[14 of 25 positions shown; findings below may reference images not displayed]

FINDINGS: Gallbladder:

Small echogenic foci along the wall of the gallbladder with mild
comet tail artifact remain, consistent with cholesterolosis or
adenomyomatosis. There are no echogenic foci which move and shadow
as is expected with gallstones. There is no gallbladder wall
thickening or pericholecystic fluid.. No sonographic Murphy sign
noted by sonographer.

Common bile duct:

Diameter: 4 mm. No intrahepatic or extrahepatic biliary duct
dilatation.

Liver:

No focal lesion identified. Within normal limits in parenchymal
echogenicity. Portal vein is patent on color Doppler imaging with
normal direction of blood flow towards the liver.
IMPRESSION: Evidence of adenomyomatosis or cholesterolosis of the gallbladder
wall remains. Appearance is essentially stable compared to prior
studies. No gallstones evident. No gallbladder wall thickening or
pericholecystic fluid.

Study otherwise unremarkable.

## 2019-07-26 ENCOUNTER — Ambulatory Visit: Payer: Self-pay | Admitting: Physician Assistant

## 2019-11-05 ENCOUNTER — Other Ambulatory Visit (HOSPITAL_COMMUNITY)
Admission: RE | Admit: 2019-11-05 | Discharge: 2019-11-05 | Disposition: A | Discharge status: Home / Self Care | Payer: Self-pay | Source: Ambulatory Visit | Attending: Physician Assistant | Admitting: Physician Assistant

## 2019-11-05 ENCOUNTER — Other Ambulatory Visit: Payer: Self-pay

## 2019-11-05 DIAGNOSIS — Z125 Encounter for screening for malignant neoplasm of prostate: Secondary | ICD-10-CM | POA: Insufficient documentation

## 2019-11-05 DIAGNOSIS — E785 Hyperlipidemia, unspecified: Secondary | ICD-10-CM | POA: Insufficient documentation

## 2019-11-05 LAB — COMPREHENSIVE METABOLIC PANEL
ALT: 18 U/L (ref 0–44)
AST: 17 U/L (ref 15–41)
Albumin: 4.4 g/dL (ref 3.5–5.0)
Alkaline Phosphatase: 61 U/L (ref 38–126)
Anion gap: 8 (ref 5–15)
BUN: 12 mg/dL (ref 6–20)
CO2: 28 mmol/L (ref 22–32)
Calcium: 9.8 mg/dL (ref 8.9–10.3)
Chloride: 101 mmol/L (ref 98–111)
Creatinine, Ser: 0.76 mg/dL (ref 0.61–1.24)
GFR calc Af Amer: >60 mL/min (ref >60)
GFR calc non Af Amer: >60 mL/min (ref >60)
Glucose, Bld: 99 mg/dL (ref 70–99)
Potassium: 4.2 mmol/L (ref 3.5–5.1)
Sodium: 137 mmol/L (ref 135–145)
Total Bilirubin: 0.8 mg/dL (ref 0.3–1.2)
Total Protein: 8 g/dL (ref 6.5–8.1)

## 2019-11-05 LAB — LIPID PANEL
Cholesterol: 167 mg/dL (ref 0–200)
HDL: 35 mg/dL — ABNORMAL LOW (ref >40)
LDL Cholesterol: 111 mg/dL — ABNORMAL HIGH (ref 0–99)
Total CHOL/HDL Ratio: 4.8 RATIO
Triglycerides: 105 mg/dL (ref <150)
VLDL: 21 mg/dL (ref 0–40)

## 2019-11-05 LAB — PSA: Prostatic Specific Antigen: 0.64 ng/mL (ref 0.00–4.00)

## 2019-11-09 ENCOUNTER — Ambulatory Visit: Payer: Self-pay | Admitting: Physician Assistant

## 2019-11-09 ENCOUNTER — Encounter: Payer: Self-pay | Admitting: Physician Assistant

## 2019-11-09 DIAGNOSIS — F1290 Cannabis use, unspecified, uncomplicated: Secondary | ICD-10-CM

## 2019-11-09 DIAGNOSIS — J449 Chronic obstructive pulmonary disease, unspecified: Secondary | ICD-10-CM

## 2019-11-09 DIAGNOSIS — F810 Specific reading disorder: Secondary | ICD-10-CM

## 2019-11-09 DIAGNOSIS — R079 Chest pain, unspecified: Secondary | ICD-10-CM

## 2019-11-09 MED ORDER — NITROGLYCERIN 0.4 MG SL SUBL: 0.4000 mg | SUBLINGUAL_TABLET | SUBLINGUAL | 3 refills | Status: DC | PRN

## 2019-11-09 NOTE — Progress Notes (Signed)
There were no vitals taken for this visit.   Subjective:    Patient ID: Edgar Roberts, male    DOB: 1961/06/24, 59 y.o.   MRN: [id number]  HPI: Edgar Roberts is a 59 y.o. male presenting on 11/09/2019 for No chief complaint on file.   HPI   This is a telemedicine appointment due to coronavirus pandemic   It is via Telephone as pt does not have a video enabled device.  I connected with  [name] on 11/09/19 by a video enabled telemedicine application and verified that I am speaking with the correct person using two identifiers.   I discussed the limitations of evaluation and management by telemedicine. The patient expressed understanding and agreed to proceed.  Pt is at work.  Provider is at office.    Pt is a 59yo Male with appointment today to follow up COPD.    Pt says he Needs help re-ordering his advair.    Pt says he is having episodes of pain that started about 3 wk ago.  He says the  Pain in left arm and neck.  He also Feels numbness and pain in his chest at times  Pt says the Pain gets worse when he gets stress.   When he gets stress his Left fingers tingle and he gets pain in chest.   He also Sometimes has pain with physical exertion-  He says his Face feels numb sometimes too.     Pt stopped smoking cigarettes years ago but is Still smoking MJ regularly.    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     Pt had EKG 07/03/2018 that showed possible old infarct and NSR.  Pt had normal stress test in 2016.   Pt was seen by Dr Wyline Mood in 2016 for similar symptoms who ordered the EST.       Relevant past medical, surgical, family and social history reviewed and updated as indicated. Interim medical history since our last visit reviewed. Allergies and medications reviewed and updated.  Review of Systems  Per HPI unless specifically indicated above     Objective:    There were no vitals taken for this visit.  Wt Readings from Last 3 Encounters:   11/05/18 148 lb 8 oz (67.4 kg)  07/27/18 146 lb 12.8 oz (66.6 kg)  07/06/18 138 lb (62.6 kg)    Physical Exam Vitals reviewed: physical exam limited by visit by telephone.  Pulmonary:     Effort: Pulmonary effort is normal. No respiratory distress.  Neurological:     Mental Status: He is alert and oriented to person, place, and time.  Psychiatric:        Attention and Perception: Attention normal.        Speech: Speech normal.        Behavior: Behavior is cooperative.     Results for orders placed or performed during the hospital encounter of 11/05/19  Lipid panel  Result Value Ref Range   Cholesterol 167 0 - 200 mg/dL   Triglycerides 962 <952 mg/dL   HDL 35 (L) >84 mg/dL   Total CHOL/HDL Ratio 4.8 RATIO   VLDL 21 0 - 40 mg/dL   LDL Cholesterol 132 (H) 0 - 99 mg/dL  Comprehensive metabolic panel  Result Value Ref Range   Sodium 137 135 - 145 mmol/L   Potassium 4.2 3.5 - 5.1 mmol/L   Chloride 101 98 - 111 mmol/L   CO2 28 22 - 32 mmol/L   Glucose, Bld  99 70 - 99 mg/dL   BUN 12 6 - 20 mg/dL   Creatinine, Ser 0.76 0.61 - 1.24 mg/dL   Calcium 9.8 8.9 - 10.3 mg/dL   Total Protein 8.0 6.5 - 8.1 g/dL   Albumin 4.4 3.5 - 5.0 g/dL   AST 17 15 - 41 U/L   ALT 18 0 - 44 U/L   Alkaline Phosphatase 61 38 - 126 U/L   Total Bilirubin 0.8 0.3 - 1.2 mg/dL   GFR calc non Af Amer >60 >60 mL/min   GFR calc Af Amer >60 >60 mL/min   Anion gap 8 5 - 15  PSA  Result Value Ref Range   Prostatic Specific Antigen 0.64 0.00 - 4.00 ng/mL      Assessment & Plan:    Encounter Diagnoses  Name Primary?  . Chronic obstructive pulmonary disease, unspecified COPD type ([place]) Yes  . Chest pain, unspecified type   . Marijuana use   . Reading difficulty       Pt is Not a reader so he is to contact care connect to get help completing [facility] financial assistance application as he doesn't read  -will not rx metoprolol due to pt's copd and history of typically low bp  Pt is to Start lowdose asa  daily.  rx NTG.   Spent long time discussing with pt proper use of ntg.  He is also encouraged to review directions with pharmacist.   If he still needs help, he can call and nurse will review information with him again.  Discussed specifically to go to ER if pain persists despite NTG and pt states understanding.   Reviewed labs with pt  Borderline lipids.   Encouraged lowfat diet.  Pt Will need statin if CHD but he prefers not to take it if he can avoid it  Will Refer to cardiology for further evaluation of this pt's chest pains.    Will have pt follow up in office 1 month.  He is to contact office sooner for worsening or new symptoms

## 2019-11-11 ENCOUNTER — Telehealth: Payer: Self-pay | Admitting: Physician Assistant

## 2019-11-11 NOTE — Telephone Encounter (Signed)

## 2019-11-11 NOTE — Progress Notes (Signed)
Virtual Visit via Telephone Note   This visit type was conducted due to national recommendations for restrictions regarding the COVID-19 Pandemic (e.g. social distancing) in an effort to limit this patient's exposure and mitigate transmission in our community.  Due to his co-morbid illnesses, this patient is at least at moderate risk for complications without adequate follow up.  This format is felt to be most appropriate for this patient at this time.  The patient did not have access to video technology/had technical difficulties with video requiring transitioning to audio format only (telephone).  All issues noted in this document were discussed and addressed.  No physical exam could be performed with this format.  Please refer to the patient's chart for his  consent to telehealth for [facility].  Evaluation Performed:  Follow-up visit  This visit type was conducted due to national recommendations for restrictions regarding the COVID-19 Pandemic (e.g. social distancing).  This format is felt to be most appropriate for this patient at this time.  All issues noted in this document were discussed and addressed.  No physical exam was performed (except for noted visual exam findings with Video Visits).  Please refer to the patient's chart (MyChart message for video visits and phone note for telephone visits) for the patient's consent to telehealth for [facility].  Date:  11/12/2019   ID:  Edgar Roberts, DOB Sep 27, 1961, MRN [id number]  Patient Location:  Home  Provider location:   Office  PCP:  Jacquelin Hawking, PA-C  Cardiologist:  Dina Rich, MD 07/28/2015 Electrophysiologist:  None   Chief Complaint:  Chest pain   History of Present Illness:    Edgar Roberts is a 59 y.o. male who presents via audio/video conferencing for a telehealth visit today.    06/2015 office visit for L chest pain radiating to L arm/hand, ETT was normal  58 y.o. yo male who has a hx of  COPD, hep C, GERD, CP 2016   PCP televisit 01/12, pt c/o episodic L arm/neck pain, also w/ numbness/pain in chest. Some exertional sx>>appt made.  Almost 2 weeks ago, he was at work, putting down flooring. He was very stressed. He was not eating, had no appetite. He had chest pain, going down his L arm and going up into his neck. The pain was sharp. It is in one spot, there is a numb feeling. He had some nausea with this. He also had some vision difficulties with this. The pain is close to his left arm. He had some chills with this.   He went home and rested. The pain lasted about 30". At its worst, it was 8.5-9.0/10.   He has had 3 episodes of pain altogether.   Today he pulled a water heater up the steps. He developed chest pain and some SOB with this.   He had some nitro, took one once and it seemed to help.   The pain this time is very different from 2016 sx.   He has had a COVID test that was negative.  The patient does not have symptoms concerning for COVID-19 infection (fever, chills, cough, or new shortness of breath).    Prior CV studies:   The following studies were reviewed today:  ETT:  07/17/2015  There was no ST segment deviation noted during stress.  Clinically and electrically negative for ischemia.  Very good exercise tolerance    ECG Normal sinus rhythm  79 bpm   .  Stress Findings The  patient exercised following the Bruce protocol.   The patient reported shortness of breath during the stress test. The patient experienced no angina during the stress test.   The test was stopped because  the patient complained of fatigue.   Blood pressure and heart rate demonstrated a normal response to exercise. Overall, the patient's exercise capacity was normal.   85% of maximum heart rate was achieved after 9.3 minutes.  Recovery time:  7.1 minutes.  The patient's response to exercise was adequate for diagnosis. Mild tingling in L hand  No CP  Response to Stress There was no  ST segment deviation noted during stress.  Arrhythmias during stress:  none.   Arrhythmias during recovery:  none.     There were no significant arrhythmias noted during the test.   ECG was interpretable and conclusive.     Past Medical History:  Diagnosis Date  . Asthma   . Chronic hepatitis C (HCC)   . COPD (chronic obstructive pulmonary disease) (HCC)   . GERD (gastroesophageal reflux disease)    Past Surgical History:  Procedure Laterality Date  . BIOPSY N/A 09/04/2015   Procedure: BIOPSY;  Surgeon: Corbin Ade, MD;  Location: [facility];  Service: Endoscopy;  Laterality: N/A;  gastric  . ESOPHAGEAL DILATION N/A 09/04/2015   Procedure:  ESOPHAGEAL DILATION;  Surgeon: Corbin Ade, MD;  Location: [facility];  Service: Endoscopy;  Laterality: N/A;  maloney 56  . ESOPHAGOGASTRODUODENOSCOPY (EGD) WITH PROPOFOL N/A 09/04/2015   Dr. Jena Gauss: mild erosive reflux esophagitis. H.pylori gastritis  . none       Current Meds  Medication Sig  . albuterol (VENTOLIN HFA) 108 (90 Base) MCG/ACT inhaler Inhale 2 puffs into the lungs every 6 (six) hours as needed for wheezing or shortness of breath.  . fluticasone-salmeterol (ADVAIR HFA) 230-21 MCG/ACT inhaler Inhale 2 puffs into the lungs 2 (two) times daily.  . nitroGLYCERIN (NITROSTAT) 0.4 MG SL tablet Place 1 tablet (0.4 mg total) under the tongue every 5 (five) minutes as needed for chest pain. May repeat for total 3 doses.  If still having CP, go to ER  . Omeprazole 20 MG TBEC Take 1 tablet (20 mg total) by mouth daily before breakfast.     Allergies:   Patient has no known allergies.   Social History   Tobacco Use  . Smoking status: Former Smoker    Packs/day: 2.00    Years: 5.00    Pack years: 10.00    Types: Cigarettes    Start date: 06/06/1976    Quit date: 10/28/1982    Years since quitting: 37.0  . Smokeless tobacco: Never Used  Substance Use Topics  . Alcohol use: No    Alcohol/week: 0.0 standard drinks    Comment: quit 01/2014    . Drug use: Yes    Types: Marijuana    Comment: everyday     Family Hx: The patient's family history includes Cirrhosis in his father; Heart disease in his mother, sister, and another family member. There is no history of Colon cancer.  ROS:   Please see the history of present illness.    All other systems reviewed and are negative.   Labs/Other Tests and Data Reviewed:    Recent Labs: 11/05/2019: ALT 18; BUN 12; Creatinine, Ser 0.76; Potassium 4.2; Sodium 137   CBC    Component Value Date/Time   WBC 4.7 08/25/2018 1016   RBC 5.09 08/25/2018 1016   HGB 16.8 08/25/2018 1016   HCT  49.4 08/25/2018 1016   PLT 219 08/25/2018 1016   MCV 97.1 08/25/2018 1016   MCH 33.0 08/25/2018 1016   MCHC 34.0 08/25/2018 1016   RDW 13.2 08/25/2018 1016   LYMPHSABS 1.8 08/25/2018 1016   MONOABS 0.3 08/25/2018 1016   EOSABS 0.4 08/25/2018 1016   BASOSABS 0.1 08/25/2018 1016    CMP Latest Ref Rng & Units 11/05/2019 08/25/2018 07/03/2018  Glucose 70 - 99 mg/dL 99 115(H) 140(H)  BUN 6 - 20 mg/dL 12 6 10   Creatinine 0.61 - 1.24 mg/dL 0.76 0.80 0.81  Sodium 135 - 145 mmol/L 137 138 135  Potassium 3.5 - 5.1 mmol/L 4.2 4.6 3.7  Chloride 98 - 111 mmol/L 101 102 102  CO2 22 - 32 mmol/L 28 28 22   Calcium 8.9 - 10.3 mg/dL 9.8 9.5 9.0  Total Protein 6.5 - 8.1 g/dL 8.0 8.3(H) 8.2(H)  Total Bilirubin 0.3 - 1.2 mg/dL 0.8 0.9 1.3(H)  Alkaline Phos 38 - 126 U/L 61 66 76  AST 15 - 41 U/L 17 18 32  ALT 0 - 44 U/L 18 14 25      Recent Lipid Panel Lab Results  Component Value Date/Time   CHOL 167 11/05/2019 09:55 AM   TRIG 105 11/05/2019 09:55 AM   HDL 35 (L) 11/05/2019 09:55 AM   CHOLHDL 4.8 11/05/2019 09:55 AM   LDLCALC 111 (H) 11/05/2019 09:55 AM    Wt Readings from Last 3 Encounters:  11/12/19 148 lb (67.1 kg)  11/05/18 148 lb 8 oz (67.4 kg)  07/27/18 146 lb 12.8 oz (66.6 kg)     Objective:    Vital Signs:  Ht 6' (1.829 m)   Wt 148 lb (67.1 kg)   BMI 20.07 kg/m    59 y.o. male in no  acute distress over the phone.   ASSESSMENT & PLAN:    1.  Chest pain - He has typical and atypical symptoms. -However, he has had exertional symptoms 3 times. -I discussed testing modalities with him. -I offered him a cardiac CT or cardiac catheterization, but he is a little wary of a test that uses dye. -I discussed a nuclear stress test, and he would prefer to start with this. -He is agreeable to scheduling an outpatient Lexiscan Myoview at any pain, follow-up with Dr. Harl Bowie after the test -His lipids are followed by his PCP and were recently checked.  I explained that if he is positive for coronary artery disease, his LDL will need to be lower and his HDL higher. -He is encouraged to quit smoking marijuana  No other issues or concerns  COVID-19 Education: The signs and symptoms of COVID-19 were discussed with the patient and how to seek care for testing (follow up with PCP or arrange E-visit).  The importance of social distancing was discussed today.  Patient Risk:   After full review of this patient's clinical status, I feel that they are at least moderate risk at this time.  Time:   Today, I have spent 17 minutes with the patient with telehealth technology discussing cardiology and other health issues.     Medication Adjustments/Labs and Tests Ordered: Current medicines are reviewed at length with the patient today.  Concerns regarding medicines are outlined above.   Tests Ordered: Lexiscan Myoview  Medication Changes: No orders of the defined types were placed in this encounter.   Disposition:  Follow up with Carlyle Dolly, MD   Signed, Rosaria Ferries, PA-C  11/12/2019 12:01 PM    [facility]

## 2019-11-12 ENCOUNTER — Telehealth (INDEPENDENT_AMBULATORY_CARE_PROVIDER_SITE_OTHER): Payer: Self-pay | Admitting: Physician Assistant

## 2019-11-12 ENCOUNTER — Encounter: Payer: Self-pay | Admitting: Physician Assistant

## 2019-11-12 VITALS — Ht 72.0 in | Wt 148.0 lb

## 2019-11-12 DIAGNOSIS — R079 Chest pain, unspecified: Secondary | ICD-10-CM

## 2019-11-12 NOTE — Patient Instructions (Signed)
Medication Instructions:  Your physician recommends that you continue on your current medications as directed. Please refer to the Current Medication list given to you today.  *If you need a refill on your cardiac medications before your next appointment, please call your pharmacy*  Lab Work: NONE If you have labs (blood work) drawn today and your tests are completely normal, you will receive your results only by: Marland Kitchen MyChart Message (if you have MyChart) OR . A paper copy in the mail If you have any lab test that is abnormal or we need to change your treatment, we will call you to review the results.  Testing/Procedures: Your physician has requested that you have a lexiscan myoview. For further information please visit [email or web address] Please follow instruction sheet, as given.    Follow-Up: At [facility], you and your health needs are our priority.  As part of our continuing mission to provide you with exceptional heart care, we have created designated Provider Care Teams.  These Care Teams include your primary Cardiologist (physician) and Advanced Practice Providers (APPs -  Physician Assistants and Nurse Practitioners) who all work together to provide you with the care you need, when you need it.  Your next appointment: after your lexiscan    The format for your next appointment:   In Person  Provider:   Dina Rich, MD  Other Instructions NONE    Thank you for choosing [facility] !

## 2019-11-15 ENCOUNTER — Telehealth: Payer: Self-pay | Admitting: Licensed Clinical Social Worker

## 2019-11-15 NOTE — Telephone Encounter (Signed)
CSW referred to assist patient with obtaining a scale and a BP cuff. CSW contacted patient to inform scale and cuff will be delivered to home. Patient grateful for support and assistance. CSW available as needed. Jackie Maysin Carstens, LCSW, CCSW-MCS [phone]  

## 2019-11-16 ENCOUNTER — Other Ambulatory Visit: Payer: Self-pay

## 2019-11-16 ENCOUNTER — Encounter (HOSPITAL_COMMUNITY)
Admission: RE | Admit: 2019-11-16 | Discharge: 2019-11-16 | Disposition: A | Discharge status: Home / Self Care | Payer: Self-pay | Source: Ambulatory Visit | Attending: Physician Assistant | Admitting: Physician Assistant

## 2019-11-16 ENCOUNTER — Encounter (HOSPITAL_BASED_OUTPATIENT_CLINIC_OR_DEPARTMENT_OTHER)
Admission: RE | Admit: 2019-11-16 | Discharge: 2019-11-16 | Disposition: A | Discharge status: Home / Self Care | Payer: Self-pay | Source: Ambulatory Visit | Attending: Physician Assistant | Admitting: Physician Assistant

## 2019-11-16 DIAGNOSIS — R079 Chest pain, unspecified: Secondary | ICD-10-CM | POA: Insufficient documentation

## 2019-11-16 LAB — NM MYOCAR MULTI W/SPECT W/WALL MOTION / EF
LV dias vol: 118 mL (ref 62–150)
LV sys vol: 58 mL
Peak HR: 117 {beats}/min
RATE: 0.33
Rest HR: 81 {beats}/min
SDS: 4
SRS: 0
SSS: 4
TID: 1.18

## 2019-11-16 MED ORDER — TECHNETIUM TC 99M TETROFOSMIN IV KIT
30.0000 | PACK | Freq: Once | INTRAVENOUS | Status: AC | PRN
  Administered 2019-11-16: 11:00:00 31 via INTRAVENOUS

## 2019-11-16 MED ORDER — TECHNETIUM TC 99M TETROFOSMIN IV KIT
10.0000 | PACK | Freq: Once | INTRAVENOUS | Status: AC | PRN
  Administered 2019-11-16: 10:00:00 9.9 via INTRAVENOUS

## 2019-11-16 MED ORDER — SODIUM CHLORIDE FLUSH 0.9 % IV SOLN
INTRAVENOUS | Status: AC
  Administered 2019-11-16: 10 mL via INTRAVENOUS
  Filled 2019-11-16: qty 10

## 2019-11-16 MED ORDER — REGADENOSON 0.4 MG/5ML IV SOLN
INTRAVENOUS | Status: AC
  Administered 2019-11-16: 11:00:00 0.4 mg via INTRAVENOUS
  Filled 2019-11-16: qty 5

## 2019-11-23 ENCOUNTER — Telehealth: Payer: Self-pay | Admitting: *Deleted

## 2019-11-23 MED ORDER — ASPIRIN EC 81 MG PO TBEC: 81.0000 mg | DELAYED_RELEASE_TABLET | Freq: Every day | ORAL | 3 refills | Status: DC

## 2019-11-23 NOTE — Telephone Encounter (Signed)
-----   Message from Darrol Jump, PA-C sent at 11/19/2019  3:18 PM EST ----- See Dr Verna Czech note, start ASA 81 mg qd and f/u in 6 weeks or so. Thanks

## 2019-11-23 NOTE — Telephone Encounter (Signed)
Barrett, Joline Salt, PA-C  Zianna Dercole, Karl Pock, LPN   Cc: Barrett, Joline Salt, PA-C  Please let him know that per Dr Wyline Mood: No further workup, would have him on an aspirin 81mg  daily. Can reassess potential for statin at f/u, I know his pcp follows that, would need to calculate his ASCVD risk score and consider from there. He should follow up in 6 weeks or so

## 2019-11-29 ENCOUNTER — Ambulatory Visit: Payer: Self-pay | Admitting: Cardiology

## 2019-12-08 ENCOUNTER — Ambulatory Visit: Payer: Self-pay | Admitting: Physician Assistant

## 2019-12-08 ENCOUNTER — Encounter: Payer: Self-pay | Admitting: Physician Assistant

## 2019-12-08 VITALS — BP 119/78 | HR 80

## 2019-12-08 DIAGNOSIS — K219 Gastro-esophageal reflux disease without esophagitis: Secondary | ICD-10-CM

## 2019-12-08 DIAGNOSIS — F1290 Cannabis use, unspecified, uncomplicated: Secondary | ICD-10-CM

## 2019-12-08 DIAGNOSIS — J449 Chronic obstructive pulmonary disease, unspecified: Secondary | ICD-10-CM

## 2019-12-08 NOTE — Progress Notes (Signed)
BP 119/78   Pulse 80    Subjective:    Patient ID: Edgar Roberts, male    DOB: Aug 13, 1961, 59 y.o.   MRN: [id number]  HPI: Edgar Roberts is a 59 y.o. male presenting on 12/08/2019 for No chief complaint on file.   HPI  This is a telemedicine appointment due to coronavirus pandemic.  It is via telephone as pt does not have a video enabled device  I connected with  Edgar Roberts on 12/08/19 by a video enabled telemedicine application and verified that I am speaking with the correct person using two identifiers.   I discussed the limitations of evaluation and management by telemedicine. The patient expressed understanding and agreed to proceed.  Pt is at home.  Provider is at office.    Pt is a 66yoM with copd and GERD.    Pt says he no longer smokes cigarettes but continues to smoke MJ.  He says he Feels good and he has no complaints.  Pt has home BP machine and reading is noted.   He says he hasn't been having the pains that he complained of at appointment last month.   He has appointment with cardiology for evaluation.     Relevant past medical, surgical, family and social history reviewed and updated as indicated. Interim medical history since our last visit reviewed. Allergies and medications reviewed and updated.    Current Outpatient Medications:  .  albuterol (VENTOLIN HFA) 108 (90 Base) MCG/ACT inhaler, Inhale 2 puffs into the lungs every 6 (six) hours as needed for wheezing or shortness of breath., Disp: 3 Inhaler, Rfl: 3 .  aspirin EC 81 MG tablet, Take 1 tablet (81 mg total) by mouth daily., Disp: 90 tablet, Rfl: 3 .  fluticasone-salmeterol (ADVAIR HFA) 230-21 MCG/ACT inhaler, Inhale 2 puffs into the lungs 2 (two) times daily., Disp: 3 Inhaler, Rfl: 3 .  Omeprazole 20 MG TBEC, Take 1 tablet (20 mg total) by mouth daily before breakfast., Disp: 30 each, Rfl: 5 .  nitroGLYCERIN (NITROSTAT) 0.4 MG SL tablet, Place 1 tablet (0.4 mg total) under the tongue every 5 (five)  minutes as needed for chest pain. May repeat for total 3 doses.  If still having CP, go to ER (Patient not taking: Reported on 12/08/2019), Disp: 25 tablet, Rfl: 3   Review of Systems  Per HPI unless specifically indicated above     Objective:    BP 119/78   Pulse 80   Wt Readings from Last 3 Encounters:  11/12/19 148 lb (67.1 kg)  11/05/18 148 lb 8 oz (67.4 kg)  07/27/18 146 lb 12.8 oz (66.6 kg)    Physical Exam Vitals reviewed.  Pulmonary:     Effort: No respiratory distress.  Neurological:     Mental Status: He is alert and oriented to person, place, and time.  Psychiatric:        Attention and Perception: Attention normal.        Speech: Speech normal.        Behavior: Behavior is cooperative.           Assessment & Plan:    Encounter Diagnoses  Name Primary?  . Chronic obstructive pulmonary disease, unspecified COPD type ([place]) Yes  . Marijuana use   . Gastroesophageal reflux disease, unspecified whether esophagitis present      -encouraged pt to stop smoking -pt to Continue current medications/inhalers -cardiology as scheduled.  He is reminded if he has CP unresponsive to NTG that he needs  to go to ER -pt to follow up 3 months.  He is to contact office sooner prn -discussed at previous appointment that he will need statin if he has CAD but he prefers to avoid rx if possible

## 2019-12-10 MED ORDER — NITROGLYCERIN 0.4 MG SL SUBL: 0.4000 mg | SUBLINGUAL_TABLET | SUBLINGUAL | 3 refills | Status: DC | PRN

## 2019-12-28 ENCOUNTER — Emergency Department (HOSPITAL_COMMUNITY)
Admission: EM | Admit: 2019-12-28 | Discharge: 2019-12-28 | Disposition: A | Discharge status: Home / Self Care | Payer: Self-pay | Attending: Emergency Medicine | Admitting: Emergency Medicine

## 2019-12-28 ENCOUNTER — Encounter (HOSPITAL_COMMUNITY): Payer: Self-pay | Admitting: Emergency Medicine

## 2019-12-28 ENCOUNTER — Other Ambulatory Visit: Payer: Self-pay

## 2019-12-28 DIAGNOSIS — Z87891 Personal history of nicotine dependence: Secondary | ICD-10-CM | POA: Insufficient documentation

## 2019-12-28 DIAGNOSIS — B86 Scabies: Secondary | ICD-10-CM | POA: Insufficient documentation

## 2019-12-28 DIAGNOSIS — J45909 Unspecified asthma, uncomplicated: Secondary | ICD-10-CM | POA: Insufficient documentation

## 2019-12-28 DIAGNOSIS — Z7982 Long term (current) use of aspirin: Secondary | ICD-10-CM | POA: Insufficient documentation

## 2019-12-28 DIAGNOSIS — Z79899 Other long term (current) drug therapy: Secondary | ICD-10-CM | POA: Insufficient documentation

## 2019-12-28 DIAGNOSIS — J449 Chronic obstructive pulmonary disease, unspecified: Secondary | ICD-10-CM | POA: Insufficient documentation

## 2019-12-28 MED ORDER — PREDNISONE 10 MG PO TABS: 20.0000 mg | ORAL_TABLET | Freq: Every day | ORAL | 0 refills | Status: AC

## 2019-12-28 MED ORDER — IVERMECTIN 3 MG PO TABS: 200.0000 ug/kg | ORAL_TABLET | ORAL | 0 refills | Status: DC

## 2019-12-28 MED ORDER — PREDNISONE 50 MG PO TABS
60.0000 mg | ORAL_TABLET | Freq: Once | ORAL | Status: AC
  Administered 2019-12-28: 60 mg via ORAL
  Filled 2019-12-28: qty 1

## 2019-12-28 MED ORDER — IVERMECTIN 3 MG PO TABS: 200.0000 ug/kg | ORAL_TABLET | Freq: Once | ORAL | Status: DC

## 2019-12-28 NOTE — ED Triage Notes (Signed)
Itchy rash all over body since Christmas

## 2019-12-28 NOTE — ED Provider Notes (Signed)
[facility] EMERGENCY DEPARTMENT Provider Note   CSN: [id number] Arrival date & time: 12/28/19  1258     History Chief Complaint  Patient presents with  . Rash    Edgar Roberts is a 59 y.o. male with past medical history significant for asthma, chronic hepatitis C, COPD, GERD presents to emergency department today with chief complaint of intermittent rash x3 months. Rash is located on his extremities. He endorses associated pruritis. He has been using OTC anti-itch cream without symptom improvement.  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 He first noticed the rash in December but states it was not that bad.  He states over the next 2 months it progressively worsened.  He denies fever, chills, fatigue, weight loss, shortness of breath, dumping, nausea, vomiting.  Denies any change in soaps, lotions or detergents.   Past Medical History:  Diagnosis Date  . Asthma   . Chronic hepatitis C (HCC)   . COPD (chronic obstructive pulmonary disease) (HCC)   . GERD (gastroesophageal reflux disease)     Patient Active Problem List   Diagnosis Date Noted  . Constipation 08/20/2017  . RUQ pain 02/14/2017  . Cigarette nicotine dependence with nicotine-induced disorder 06/03/2016  . Chronic obstructive pulmonary disease (HCC) 12/04/2015  . Cirrhosis of liver without ascites (HCC)   . Mucosal abnormality of stomach   . Hiatal hernia   . Reflux esophagitis   . Abnormal gallbladder ultrasound 08/17/2015  . Esophageal dysphagia 08/17/2015  . Chest pain, moderate coronary artery risk 06/19/2015  . GERD (gastroesophageal reflux disease) 06/19/2015  . Chronic hepatitis C (HCC) 05/08/2015  . Abdominal pain, epigastric 05/08/2015  . Loss of weight 05/08/2015  . Nausea without vomiting 05/08/2015    Past Surgical History:  Procedure Laterality Date  . BIOPSY N/A 09/04/2015   Procedure: BIOPSY;  Surgeon: Corbin Ade, MD;  Location: [facility];  Service: Endoscopy;  Laterality: N/A;   gastric  . ESOPHAGEAL DILATION N/A 09/04/2015   Procedure:  ESOPHAGEAL DILATION;  Surgeon: Corbin Ade, MD;  Location: [facility];  Service: Endoscopy;  Laterality: N/A;  maloney 56  . ESOPHAGOGASTRODUODENOSCOPY (EGD) WITH PROPOFOL N/A 09/04/2015   Dr. Jena Gauss: mild erosive reflux esophagitis. H.pylori gastritis  . none         Family History  Problem Relation Age of Onset  . Cirrhosis Father        deceased  . Heart disease Sister        3 open heart surgeries  . Heart disease Mother   . Heart disease Other        grandmother  . Colon cancer Neg Hx     Social History   Tobacco Use  . Smoking status: Former Smoker    Packs/day: 2.00    Years: 5.00    Pack years: 10.00    Types: Cigarettes    Start date: 06/06/1976    Quit date: 10/28/1982    Years since quitting: 37.1  . Smokeless tobacco: Never Used  Substance Use Topics  . Alcohol use: No    Alcohol/week: 0.0 standard drinks    Comment: quit 01/2014  . Drug use: Yes    Types: Marijuana    Comment: everyday    Home Medications Prior to Admission medications   Medication Sig Start Date End Date Taking? Authorizing Provider  albuterol (VENTOLIN HFA) 108 (90 Base) MCG/ACT inhaler Inhale 2 puffs into the lungs every 6 (six) hours as needed for wheezing or shortness of breath. 03/08/19  Soyla Dryer, PA-C  aspirin EC 81 MG tablet Take 1 tablet (81 mg total) by mouth daily. 11/23/19   Arnoldo Lenis, MD  fluticasone-salmeterol (ADVAIR HFA) [phone] MCG/ACT inhaler Inhale 2 puffs into the lungs 2 (two) times daily. 03/08/19   Soyla Dryer, PA-C  ivermectin (STROMECTOL) 3 MG TABS tablet Take 4.5 tablets (13,500 mcg total) by mouth once a week. Take 1 dose today and next dose on 01/04/2020 12/28/19   Albrizze, Verline Lema E, PA-C  nitroGLYCERIN (NITROSTAT) 0.4 MG SL tablet Place 1 tablet (0.4 mg total) under the tongue every 5 (five) minutes as needed for chest pain. May repeat for total 3 doses.  If still having CP, go to ER 12/10/19    Soyla Dryer, PA-C  Omeprazole 20 MG TBEC Take 1 tablet (20 mg total) by mouth daily before breakfast. 12/30/17   Mahala Menghini, PA-C  predniSONE (DELTASONE) 10 MG tablet Take 2 tablets (20 mg total) by mouth daily for 4 days. 12/28/19 01/01/20  Albrizze, Harley Hallmark, PA-C    Allergies    Patient has no known allergies.  Review of Systems   Review of Systems  All other systems are reviewed and are negative for acute change except as noted in the HPI.   Physical Exam Updated Vital Signs BP (!) 141/90 (BP Location: Right Arm)   Pulse 88   Temp 98.2 F (36.8 C) (Oral)   Resp 17   Ht 6' (1.829 m)   Wt 69.9 kg   SpO2 98%   BMI 20.89 kg/m   Physical Exam Vitals and nursing note reviewed.  Constitutional:      Appearance: He is well-developed. He is not ill-appearing or toxic-appearing.  HENT:     Head: Normocephalic and atraumatic.     Comments: Airway intact. No angioedema    Right Ear: Tympanic membrane and external ear normal.     Left Ear: Tympanic membrane and external ear normal.     Nose: Nose normal.     Mouth/Throat:     Mouth: Mucous membranes are moist.     Pharynx: Oropharynx is clear.  Eyes:     General: No scleral icterus.       Right eye: No discharge.        Left eye: No discharge.     Conjunctiva/sclera: Conjunctivae normal.  Neck:     Vascular: No JVD.  Cardiovascular:     Rate and Rhythm: Normal rate and regular rhythm.     Pulses: Normal pulses.     Heart sounds: Normal heart sounds.  Pulmonary:     Effort: Pulmonary effort is normal.     Breath sounds: Normal breath sounds.  Abdominal:     General: There is no distension.  Musculoskeletal:        General: Normal range of motion.     Cervical back: Normal range of motion.  Skin:    General: Skin is warm and dry.     Findings: Rash present.     Comments: Thick, crusted, fissured plaques on all extremities. Head is spared.  Plaques also are on wrist and finger web spaces. No drainage.    Patient itching during exam  Neurological:     Mental Status: He is oriented to person, place, and time.     GCS: GCS eye subscore is 4. GCS verbal subscore is 5. GCS motor subscore is 6.     Comments: Fluent speech, no facial droop.  Psychiatric:  Behavior: Behavior normal.     ED Results / Procedures / Treatments   Labs (all labs ordered are listed, but only abnormal results are displayed) Labs Reviewed - No data to display  EKG None  Radiology No results found.  Procedures Procedures (including critical care time)  Medications Ordered in ED Medications  predniSONE (DELTASONE) tablet 60 mg (60 mg Oral Given 12/28/19 1358)    ED Course  I have reviewed the triage vital signs and the nursing notes.  Pertinent labs & imaging results that were available during my care of the patient were reviewed by me and considered in my medical decision making (see chart for details).    MDM Rules/Calculators/A&P                      Rash consistent with scabies. Patient denies any difficulty breathing or swallowing.  Pt has a patent airway without stridor and is handling secretions without difficulty; no angioedema. No blisters, no pustules, no warmth, no draining sinus tracts, no superficial abscesses, no bullous impetigo, no vesicles, no desquamation, no target lesions with dusky purpura or a central bulla. Not tender to touch. No concern for superimposed infection. No concern for SJS, TEN, TSS, tick borne illness, syphilis or other life-threatening condition.  Will discharge with prescription for PO ivermectin to take today and repeat in 1 week. Also given short burst of steroids.   The patient appears reasonably screened and/or stabilized for discharge and I doubt any other medical condition or other [facility] requiring further screening, evaluation, or treatment in the ED at this time prior to discharge. The patient is safe for discharge with strict return precautions discussed.  Recommend pcp follow up for symptom recheck.   Portions of this note were generated with Scientist, clinical (histocompatibility and immunogenetics). Dictation errors may occur despite best attempts at proofreading.    Final Clinical Impression(s) / ED Diagnoses Final diagnoses:  Scabies    Rx / DC Orders ED Discharge Orders         Ordered    ivermectin (STROMECTOL) 3 MG TABS tablet  Weekly     12/28/19 1346    predniSONE (DELTASONE) 10 MG tablet  Daily     12/28/19 1346           Kathyrn Lass 12/28/19 1405    Terrilee Files, MD 12/28/19 2024

## 2019-12-28 NOTE — Discharge Instructions (Addendum)
You have been seen today for rash. Please read and follow all provided instructions. Return to the emergency room for worsening condition or new concerning symptoms.    Your rash looks scabies. Please read the attached information  1. Medications:  Prescription sent to your pharmacy for ivermectin.  This is a medication used to treat scabies. -Take 1 dose today (4.5 tablets) and take the second dose in 1 week on 01/04/2020. -Prescription also sent to the pharmacy for prednisone. Please take as prescribed. This is to help the itching.  Continue usual home medications Take medications as prescribed. Please review all of the medicines and only take them if you do not have an allergy to them.   2. Treatment: rest, drink plenty of fluids. Wash your bed sheets in hot water.  3. Follow Up:  Please follow up with primary care provider in 1 week for symptom recheck   It is also a possibility that you have an allergic reaction to any of the medicines that you have been prescribed - Everybody reacts differently to medications and while MOST people have no trouble with most medicines, you may have a reaction such as nausea, vomiting, rash, swelling, shortness of breath. If this is the case, please stop taking the medicine immediately and contact your physician.  ?

## 2020-01-13 ENCOUNTER — Encounter: Payer: Self-pay | Admitting: Cardiology

## 2020-01-13 ENCOUNTER — Ambulatory Visit (INDEPENDENT_AMBULATORY_CARE_PROVIDER_SITE_OTHER): Payer: Self-pay | Admitting: Cardiology

## 2020-01-13 VITALS — BP 118/78 | HR 84 | Temp 99.3°F | Ht 72.0 in | Wt 157.0 lb

## 2020-01-13 DIAGNOSIS — R0789 Other chest pain: Secondary | ICD-10-CM

## 2020-01-13 MED ORDER — ATORVASTATIN CALCIUM 40 MG PO TABS: 40.0000 mg | ORAL_TABLET | Freq: Every day | ORAL | 3 refills | Status: DC

## 2020-01-13 NOTE — Patient Instructions (Signed)
Medication Instructions:  START LIPITOR 40 MG DAILY   Labwork: NONE  Testing/Procedures: NONE  Follow-Up: Your physician wants you to follow-up in: 6 MONTHS You will receive a reminder letter in the mail two months in advance. If you don't receive a letter, please call our office to schedule the follow-up appointment.   Any Other Special Instructions Will Be Listed Below (If Applicable).     If you need a refill on your cardiac medications before your next appointment, please call your pharmacy.   

## 2020-01-13 NOTE — Progress Notes (Signed)
Clinical Summary Edgar Roberts is a 58 y.o.male  1. Chest pain - started 3-4 months ago. "Funny feeling" left chest and radiates into arm and hand. Tends to occur with activity. Can cause some diaphoresis and nausea. + SOB. Can be worst with deep breaths. No relation to food. Lasts about 10-20 minutes. Occurs 3 times a week. - notes some generalized fatigue. Notes some DOE with walking uphill - no LE edema, no orthopna  CAD risk factors: tobacco, sister with "open heart surgeries" and stents.   Jan 2021 nuclear stress: inferior/inferoseptal infarct, no current ischemia. Low risk study - some ongoing symptoms unchanged, has f/u coming up with pcp   2. ASCVD risk - ASCVD risk score is 7%, qualifying as borderline risk.  - nuclear stress test would suggest prior infarct - has not been on statin    Past Medical History:  Diagnosis Date  . Asthma   . Chronic hepatitis C (HCC)   . COPD (chronic obstructive pulmonary disease) (HCC)   . GERD (gastroesophageal reflux disease)      No Known Allergies   Current Outpatient Medications  Medication Sig Dispense Refill  . albuterol (VENTOLIN HFA) 108 (90 Base) MCG/ACT inhaler Inhale 2 puffs into the lungs every 6 (six) hours as needed for wheezing or shortness of breath. 3 Inhaler 3  . aspirin EC 81 MG tablet Take 1 tablet (81 mg total) by mouth daily. 90 tablet 3  . fluticasone-salmeterol (ADVAIR HFA) 230-21 MCG/ACT inhaler Inhale 2 puffs into the lungs 2 (two) times daily. 3 Inhaler 3  . ivermectin (STROMECTOL) 3 MG TABS tablet Take 4.5 tablets (13,500 mcg total) by mouth once a week. Take 1 dose today and next dose on 01/04/2020 9 tablet 0  . nitroGLYCERIN (NITROSTAT) 0.4 MG SL tablet Place 1 tablet (0.4 mg total) under the tongue every 5 (five) minutes as needed for chest pain. May repeat for total 3 doses.  If still having CP, go to ER 25 tablet 3  . Omeprazole 20 MG TBEC Take 1 tablet (20 mg total) by mouth daily before breakfast.  30 each 5   No current facility-administered medications for this visit.     Past Surgical History:  Procedure Laterality Date  . BIOPSY N/A 09/04/2015   Procedure: BIOPSY;  Surgeon: Corbin Ade, MD;  Location: [facility];  Service: Endoscopy;  Laterality: N/A;  gastric  . ESOPHAGEAL DILATION N/A 09/04/2015   Procedure:  ESOPHAGEAL DILATION;  Surgeon: Corbin Ade, MD;  Location: [facility];  Service: Endoscopy;  Laterality: N/A;  maloney 56  . ESOPHAGOGASTRODUODENOSCOPY (EGD) WITH PROPOFOL N/A 09/04/2015   Dr. Jena Gauss: mild erosive reflux esophagitis. H.pylori gastritis  . none       No Known Allergies    Family History  Problem Relation Age of Onset  . Cirrhosis Father        deceased  . Heart disease Sister        3 open heart surgeries  . Heart disease Mother   . Heart disease Other        grandmother  . Colon cancer Neg Hx      Social History Edgar Roberts reports that he quit smoking about 37 years ago. His smoking use included cigarettes. He started smoking about 43 years ago. He has a 10.00 pack-year smoking history. He has never used smokeless tobacco. Edgar Roberts reports no history of alcohol use.   Review of Systems CONSTITUTIONAL: No weight loss, fever, chills, weakness  or fatigue.  HEENT: Eyes: No visual loss, blurred vision, double vision or yellow sclerae.No hearing loss, sneezing, congestion, runny nose or sore throat.  SKIN: No rash or itching.  CARDIOVASCULAR: per hpi RESPIRATORY: No shortness of breath, cough or sputum.  GASTROINTESTINAL: No anorexia, nausea, vomiting or diarrhea. No abdominal pain or blood.  GENITOURINARY: No burning on urination, no polyuria NEUROLOGICAL: No headache, dizziness, syncope, paralysis, ataxia, numbness or tingling in the extremities. No change in bowel or bladder control.  MUSCULOSKELETAL: No muscle, back pain, joint pain or stiffness.  LYMPHATICS: No enlarged nodes. No history of splenectomy.  PSYCHIATRIC: No history of  depression or anxiety.  ENDOCRINOLOGIC: No reports of sweating, cold or heat intolerance. No polyuria or polydipsia.  Marland Kitchen   Physical Examination Today's Vitals   01/13/20 1256  BP: 118/78  Pulse: 84  Temp: 99.3 F (37.4 C)  SpO2: 97%  Weight: 157 lb (71.2 kg)  Height: 6' (1.829 m)   Body mass index is 21.29 kg/m.  Gen: resting comfortably, no acute distress HEENT: no scleral icterus, pupils equal round and reactive, no palptable cervical adenopathy,  CV: RRR, no /mr/g, no jvd Resp: Clear to auscultation bilaterally GI: abdomen is soft, non-tender, non-distended, normal bowel sounds, no hepatosplenomegaly MSK: extremities are warm, no edema.  Skin: warm, no rash Neuro:  no focal deficits Psych: appropriate affect   Diagnostic Studies  06/2015 [facility]  There was no ST segment deviation noted during stress.   Clinically and electrically negative for ischemia.  Very good exercise tolerance    Jan 2021 nuclear stress test  There was no ST segment deviation noted during stress.  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  This is a low risk study. There is no current myocardium at jeopardy  The left ventricular ejection fraction is low normal (50%).    Assessment and Plan   1. Chest pain - nuclear stress without current ischemia, possible old inferior infarct - no further cardiac workup at this time - continue ASA. ASCVD risk by calculator is 7% which is borderline for statin, given nuclear stress findings would favor starting statin as this suggests existing disease. Will start atorva 40mg  daily.      Arnoldo Lenis, M.D.

## 2020-01-20 ENCOUNTER — Other Ambulatory Visit: Payer: Self-pay | Admitting: Physician Assistant

## 2020-01-20 MED ORDER — FLUTICASONE-SALMETEROL 230-21 MCG/ACT IN AERO: 2.0000 | INHALATION_SPRAY | Freq: Two times a day (BID) | RESPIRATORY_TRACT | 3 refills | Status: DC

## 2020-03-07 ENCOUNTER — Encounter: Payer: Self-pay | Admitting: Physician Assistant

## 2020-03-07 ENCOUNTER — Ambulatory Visit: Payer: Self-pay | Admitting: Physician Assistant

## 2020-03-07 DIAGNOSIS — J449 Chronic obstructive pulmonary disease, unspecified: Secondary | ICD-10-CM

## 2020-03-07 DIAGNOSIS — M79605 Pain in left leg: Secondary | ICD-10-CM

## 2020-03-07 DIAGNOSIS — E785 Hyperlipidemia, unspecified: Secondary | ICD-10-CM

## 2020-03-07 DIAGNOSIS — K219 Gastro-esophageal reflux disease without esophagitis: Secondary | ICD-10-CM

## 2020-03-07 DIAGNOSIS — F1290 Cannabis use, unspecified, uncomplicated: Secondary | ICD-10-CM

## 2020-03-07 DIAGNOSIS — R079 Chest pain, unspecified: Secondary | ICD-10-CM

## 2020-03-07 NOTE — Progress Notes (Signed)
There were no vitals taken for this visit.   Subjective:    Patient ID: Edgar Roberts, male    DOB: 1961-10-28, 59 y.o.   MRN: [id number]  HPI: Edgar Roberts is a 59 y.o. male presenting on 03/07/2020 for No chief complaint on file.   HPI   This is a telemedicine appointment due to coronavirus pandemic.  It is via Telephone as pt does not have a video enabled device.   I connected with  [name] on 5/11/21by a video enabled telemedicine application and verified that I am speaking with the correct person using two identifiers.   I discussed the limitations of evaluation and management by telemedicine. The patient expressed understanding and agreed to proceed.  Pt is at home.  Provider is at office.    Pt is 58yoM with COPD and GERD.  He continues to smoke.  He says his Breathing is "so-so".  Pt complains of Some LLE pain-  From hip to foot.  He says it is every day.  He says it Goes away with rest.   No swelling.  It does not get blue or cold that he can tell.   He describes it as Sometimes it "feels like it goes dead".  At this moment it is feeling okay.   He has home bp machine but has not been checking it.    He says he Still has  CP at times but not as bad.  Pt has been seen by cardiology this year and had nuclear stress test.    Relevant past medical, surgical, family and social history reviewed and updated as indicated. Interim medical history since our last visit reviewed. Allergies and medications reviewed and updated.   Current Outpatient Medications:  .  albuterol (VENTOLIN HFA) 108 (90 Base) MCG/ACT inhaler, Inhale 2 puffs into the lungs every 6 (six) hours as needed for wheezing or shortness of breath., Disp: 3 Inhaler, Rfl: 3 .  aspirin EC 81 MG tablet, Take 1 tablet (81 mg total) by mouth daily., Disp: 90 tablet, Rfl: 3 .  atorvastatin (LIPITOR) 40 MG tablet, Take 1 tablet (40 mg total) by mouth daily., Disp: 90 tablet, Rfl: 3 .  mometasone-formoterol  (DULERA) 200-5 MCG/ACT AERO, Inhale 2 puffs into the lungs 2 (two) times daily., Disp: , Rfl:  .  Omeprazole 20 MG TBEC, Take 1 tablet (20 mg total) by mouth daily before breakfast., Disp: 30 each, Rfl: 5 .  fluticasone-salmeterol (ADVAIR HFA) 230-21 MCG/ACT inhaler, Inhale 2 puffs into the lungs 2 (two) times daily. (Patient not taking: Reported on 03/07/2020), Disp: 3 Inhaler, Rfl: 3 .  nitroGLYCERIN (NITROSTAT) 0.4 MG SL tablet, Place 1 tablet (0.4 mg total) under the tongue every 5 (five) minutes as needed for chest pain. May repeat for total 3 doses.  If still having CP, go to ER (Patient not taking: Reported on 03/07/2020), Disp: 25 tablet, Rfl: 3    Review of Systems  Per HPI unless specifically indicated above     Objective:    There were no vitals taken for this visit.  Wt Readings from Last 3 Encounters:  01/13/20 157 lb (71.2 kg)  12/28/19 154 lb (69.9 kg)  11/12/19 148 lb (67.1 kg)    Physical Exam Pulmonary:     Effort: No respiratory distress.  Neurological:     Mental Status: He is alert and oriented to person, place, and time.  Psychiatric:        Speech: Speech normal.  Behavior: Behavior is cooperative.            Assessment & Plan:    Encounter Diagnoses  Name Primary?  . Chronic obstructive pulmonary disease, unspecified COPD type ([place]) Yes  . Gastroesophageal reflux disease, unspecified whether esophagitis present   . Hyperlipidemia, unspecified hyperlipidemia type   . Pain of left lower extremity   . Marijuana use   . Chest pain, unspecified type      Pt to come in tomorrow for in-office appointment to evaluate leg

## 2020-03-08 ENCOUNTER — Ambulatory Visit: Payer: Self-pay | Admitting: Physician Assistant

## 2020-03-15 ENCOUNTER — Encounter: Payer: Self-pay | Admitting: Physician Assistant

## 2020-03-15 ENCOUNTER — Ambulatory Visit: Payer: Self-pay | Admitting: Physician Assistant

## 2020-03-15 ENCOUNTER — Other Ambulatory Visit: Payer: Self-pay

## 2020-03-15 VITALS — BP 116/70 | HR 77 | Temp 99.0°F | Wt 156.6 lb

## 2020-03-15 DIAGNOSIS — F1290 Cannabis use, unspecified, uncomplicated: Secondary | ICD-10-CM

## 2020-03-15 DIAGNOSIS — K219 Gastro-esophageal reflux disease without esophagitis: Secondary | ICD-10-CM

## 2020-03-15 DIAGNOSIS — E785 Hyperlipidemia, unspecified: Secondary | ICD-10-CM

## 2020-03-15 DIAGNOSIS — J449 Chronic obstructive pulmonary disease, unspecified: Secondary | ICD-10-CM

## 2020-03-15 MED ORDER — ALBUTEROL SULFATE HFA 108 (90 BASE) MCG/ACT IN AERS: 2.0000 | INHALATION_SPRAY | Freq: Four times a day (QID) | RESPIRATORY_TRACT | 1 refills | Status: DC | PRN

## 2020-03-15 MED ORDER — OMEPRAZOLE 20 MG PO TBEC: 20.0000 mg | DELAYED_RELEASE_TABLET | Freq: Every day | ORAL | 1 refills | Status: DC

## 2020-03-15 MED ORDER — ATORVASTATIN CALCIUM 40 MG PO TABS: 40.0000 mg | ORAL_TABLET | Freq: Every day | ORAL | 1 refills | Status: DC

## 2020-03-15 NOTE — Progress Notes (Signed)
BP 116/70   Pulse 77   Temp 99 F (37.2 C)   Wt 156 lb 9.6 oz (71 kg)   SpO2 95%   BMI 21.24 kg/m    Subjective:    Patient ID: Edgar Roberts, male    DOB: 1961-10-24, 59 y.o.   MRN: [id number]  HPI: Edgar Roberts is a 59 y.o. male presenting on 03/15/2020 for Leg Problem (L leg felt like it was numb on 03-07-20 which had started about a week before being intermittent.pt states has not had numbness since 03-07-20 and his leg feels well today. pt states when he felt his leg was numb he was not able to walk well as he felt like he was going to fall. )   HPI    Pt had a negative covid 19 screening questionnaire.   Pt is in to office today to check leg as above.  He says it is all well now.   He Smokes only MJ, not cigarettes  Pt is on adviair from manufacturer (not [facility])  Pt says he feels well and he has no complaints.   Relevant past medical, surgical, family and social history reviewed and updated as indicated. Interim medical history since our last visit reviewed. Allergies and medications reviewed and updated.   Current Outpatient Medications:  .  albuterol (VENTOLIN HFA) 108 (90 Base) MCG/ACT inhaler, Inhale 2 puffs into the lungs every 6 (six) hours as needed for wheezing or shortness of breath., Disp: 3 Inhaler, Rfl: 3 .  aspirin EC 81 MG tablet, Take 1 tablet (81 mg total) by mouth daily., Disp: 90 tablet, Rfl: 3 .  atorvastatin (LIPITOR) 40 MG tablet, Take 1 tablet (40 mg total) by mouth daily., Disp: 90 tablet, Rfl: 3 .  fluticasone-salmeterol (ADVAIR HFA) 230-21 MCG/ACT inhaler, Inhale 2 puffs into the lungs 2 (two) times daily., Disp: 3 Inhaler, Rfl: 3 .  nitroGLYCERIN (NITROSTAT) 0.4 MG SL tablet, Place 1 tablet (0.4 mg total) under the tongue every 5 (five) minutes as needed for chest pain. May repeat for total 3 doses.  If still having CP, go to ER, Disp: 25 tablet, Rfl: 3 .  Omeprazole 20 MG TBEC, Take 1 tablet (20 mg total) by mouth daily before  breakfast., Disp: 30 each, Rfl: 5     Review of Systems  Per HPI unless specifically indicated above     Objective:    BP 116/70   Pulse 77   Temp 99 F (37.2 C)   Wt 156 lb 9.6 oz (71 kg)   SpO2 95%   BMI 21.24 kg/m   Wt Readings from Last 3 Encounters:  03/15/20 156 lb 9.6 oz (71 kg)  01/13/20 157 lb (71.2 kg)  12/28/19 154 lb (69.9 kg)    Physical Exam Vitals reviewed.  Constitutional:      Appearance: He is well-developed.  HENT:     Head: Normocephalic and atraumatic.  Cardiovascular:     Rate and Rhythm: Normal rate and regular rhythm.     Pulses:          Dorsalis pedis pulses are 2+ on the right side and 2+ on the left side.  Pulmonary:     Effort: Pulmonary effort is normal.     Breath sounds: Normal breath sounds. No wheezing.  Abdominal:     General: Bowel sounds are normal.     Palpations: Abdomen is soft.     Tenderness: There is no abdominal tenderness.  Musculoskeletal:  Cervical back: Neck supple.     Right foot: Normal range of motion. No deformity.     Left foot: Normal range of motion. No deformity.  Feet:     Right foot:     Skin integrity: Skin integrity normal.     Left foot:     Skin integrity: Skin integrity normal.     Comments: Feet warm and dry, good color, no swelling Lymphadenopathy:     Cervical: No cervical adenopathy.  Skin:    General: Skin is warm and dry.  Neurological:     Mental Status: He is alert and oriented to person, place, and time.  Psychiatric:        Behavior: Behavior normal.            Assessment & Plan:   Encounter Diagnoses  Name Primary?  . Chronic obstructive pulmonary disease, unspecified COPD type ([place]) Yes  . Hyperlipidemia, unspecified hyperlipidemia type   . Marijuana use   . Gastroesophageal reflux disease, unspecified whether esophagitis present       -Check lipids labs- will call results -pt to continue current medications -follow up 3 months.  He is to call office prior to  that time if needed

## 2020-05-05 ENCOUNTER — Emergency Department (HOSPITAL_COMMUNITY)
Admission: EM | Admit: 2020-05-05 | Discharge: 2020-05-05 | Disposition: A | Discharge status: Home / Self Care | Payer: Self-pay | Attending: Emergency Medicine | Admitting: Emergency Medicine

## 2020-05-05 ENCOUNTER — Other Ambulatory Visit: Payer: Self-pay

## 2020-05-05 ENCOUNTER — Encounter (HOSPITAL_COMMUNITY): Payer: Self-pay

## 2020-05-05 DIAGNOSIS — Z5321 Procedure and treatment not carried out due to patient leaving prior to being seen by health care provider: Secondary | ICD-10-CM | POA: Insufficient documentation

## 2020-05-05 DIAGNOSIS — L0231 Cutaneous abscess of buttock: Secondary | ICD-10-CM | POA: Insufficient documentation

## 2020-05-05 NOTE — ED Triage Notes (Signed)
Pt to er, pt states that he has a splinter in his R leg for the past year, states that he hasn't been eating much, states that he might be dehydrated, states that he also has an abscess/wound on his buttocks.  resps even and unlabored

## 2020-05-06 ENCOUNTER — Encounter (HOSPITAL_COMMUNITY): Payer: Self-pay

## 2020-05-06 ENCOUNTER — Emergency Department (HOSPITAL_COMMUNITY)
Admission: EM | Admit: 2020-05-06 | Discharge: 2020-05-06 | Disposition: A | Discharge status: Home / Self Care | Payer: Self-pay | Attending: Emergency Medicine | Admitting: Emergency Medicine

## 2020-05-06 DIAGNOSIS — L0591 Pilonidal cyst without abscess: Secondary | ICD-10-CM | POA: Insufficient documentation

## 2020-05-06 DIAGNOSIS — Z79899 Other long term (current) drug therapy: Secondary | ICD-10-CM | POA: Insufficient documentation

## 2020-05-06 DIAGNOSIS — Z7982 Long term (current) use of aspirin: Secondary | ICD-10-CM | POA: Insufficient documentation

## 2020-05-06 DIAGNOSIS — J449 Chronic obstructive pulmonary disease, unspecified: Secondary | ICD-10-CM | POA: Insufficient documentation

## 2020-05-06 DIAGNOSIS — R110 Nausea: Secondary | ICD-10-CM | POA: Insufficient documentation

## 2020-05-06 DIAGNOSIS — J45909 Unspecified asthma, uncomplicated: Secondary | ICD-10-CM | POA: Insufficient documentation

## 2020-05-06 DIAGNOSIS — Z87891 Personal history of nicotine dependence: Secondary | ICD-10-CM | POA: Insufficient documentation

## 2020-05-06 LAB — COMPREHENSIVE METABOLIC PANEL
ALT: 16 U/L (ref 0–44)
AST: 16 U/L (ref 15–41)
Albumin: 4.7 g/dL (ref 3.5–5.0)
Alkaline Phosphatase: 81 U/L (ref 38–126)
Anion gap: 10 (ref 5–15)
BUN: 10 mg/dL (ref 6–20)
CO2: 27 mmol/L (ref 22–32)
Calcium: 9.6 mg/dL (ref 8.9–10.3)
Chloride: 100 mmol/L (ref 98–111)
Creatinine, Ser: 0.79 mg/dL (ref 0.61–1.24)
GFR calc Af Amer: >60 mL/min (ref >60)
GFR calc non Af Amer: >60 mL/min (ref >60)
Glucose, Bld: 98 mg/dL (ref 70–99)
Potassium: 3.5 mmol/L (ref 3.5–5.1)
Sodium: 137 mmol/L (ref 135–145)
Total Bilirubin: 1.2 mg/dL (ref 0.3–1.2)
Total Protein: 8.5 g/dL — ABNORMAL HIGH (ref 6.5–8.1)

## 2020-05-06 LAB — CBC WITH DIFFERENTIAL/PLATELET
Abs Immature Granulocytes: 0.02 10*3/uL (ref 0.00–0.07)
Basophils Absolute: 0.1 10*3/uL (ref 0.0–0.1)
Basophils Relative: 1 %
Eosinophils Absolute: 0.2 10*3/uL (ref 0.0–0.5)
Eosinophils Relative: 2 %
HCT: 47.8 % (ref 39.0–52.0)
Hemoglobin: 16.3 g/dL (ref 13.0–17.0)
Immature Granulocytes: 0 %
Lymphocytes Relative: 14 %
Lymphs Abs: 1.4 10*3/uL (ref 0.7–4.0)
MCH: 32.2 pg (ref 26.0–34.0)
MCHC: 34.1 g/dL (ref 30.0–36.0)
MCV: 94.5 fL (ref 80.0–100.0)
Monocytes Absolute: 0.8 10*3/uL (ref 0.1–1.0)
Monocytes Relative: 8 %
Neutro Abs: 7.1 10*3/uL (ref 1.7–7.7)
Neutrophils Relative %: 75 %
Platelets: 224 10*3/uL (ref 150–400)
RBC: 5.06 MIL/uL (ref 4.22–5.81)
RDW: 12.9 % (ref 11.5–15.5)
WBC: 9.6 10*3/uL (ref 4.0–10.5)
nRBC: 0 % (ref 0.0–0.2)

## 2020-05-06 LAB — LIPASE, BLOOD: Lipase: 20 U/L (ref 11–51)

## 2020-05-06 MED ORDER — SODIUM CHLORIDE 0.9 % IV BOLUS
1000.0000 mL | Freq: Once | INTRAVENOUS | Status: AC
  Administered 2020-05-06: 1000 mL via INTRAVENOUS

## 2020-05-06 MED ORDER — DOXYCYCLINE HYCLATE 100 MG PO TABS
100.0000 mg | ORAL_TABLET | Freq: Once | ORAL | Status: AC
  Administered 2020-05-06: 100 mg via ORAL
  Filled 2020-05-06: qty 1

## 2020-05-06 MED ORDER — LIDOCAINE-EPINEPHRINE (PF) 2 %-1:200000 IJ SOLN
10.0000 mL | Freq: Once | INTRAMUSCULAR | Status: AC
  Administered 2020-05-06: 10 mL via INTRADERMAL
  Filled 2020-05-06: qty 10

## 2020-05-06 MED ORDER — DOXYCYCLINE HYCLATE 100 MG PO CAPS: 100.0000 mg | ORAL_CAPSULE | Freq: Two times a day (BID) | ORAL | 0 refills | Status: AC

## 2020-05-06 MED ORDER — ONDANSETRON HCL 4 MG/2ML IJ SOLN
4.0000 mg | Freq: Once | INTRAMUSCULAR | Status: AC
  Administered 2020-05-06: 4 mg via INTRAVENOUS
  Filled 2020-05-06: qty 2

## 2020-05-06 NOTE — Discharge Instructions (Signed)
You can take Tylenol or Ibuprofen as directed for pain. You can alternate Tylenol and Ibuprofen every 4 hours. If you take Tylenol at 1pm, then you can take Ibuprofen at 5pm. Then you can take Tylenol again at 9pm.   Take antibiotics as directed. Please take all of your antibiotics until finished.  Return the emergency department in 2 days to have the wound rechecked and the drainage removed.  Follow-up with her for general surgeon's office for other evaluation.  Return to emergency department for any fevers, worsening pain, redness or swelling that begins to spread, vomiting, any other worsening concerning symptoms.

## 2020-05-06 NOTE — ED Provider Notes (Signed)
[facility] EMERGENCY DEPARTMENT Provider Note   CSN: [id number] Arrival date & time: 05/06/20  1244     History Chief Complaint  Patient presents with  . Abscess    Edgar Roberts is a 59 y.o. male past med history of asthma, chronic hepatitis, COPD, GERD who presents for area of pain, redness, swelling noted to the left buttock.  He states that this is been there for about 5 days.  He states that since onset of symptoms, the area has become more painful, more swollen.  He has had some subjective fever chills at home but has not actually measured a temperature.  States it hurts more when he sits down.  He has not taken any medications for the symptoms.  He also feels like over the last 2 months, he has had some intermittent abdominal pain.  He states it is mostly in the epigastric region.  Occasionally, he will have "dry heaves" because he knows that eating the food will hurt his stomach.  He has not actually vomited but has had worsening dry heaves over the last few days.  Patient denies any IV drug use.  He does smoke weed.  Denies any alcohol use. He denies any CP, SOB.    The history is provided by the patient.       Past Medical History:  Diagnosis Date  . Asthma   . Chronic hepatitis C (HCC)   . COPD (chronic obstructive pulmonary disease) (HCC)   . GERD (gastroesophageal reflux disease)     Patient Active Problem List   Diagnosis Date Noted  . Constipation 08/20/2017  . RUQ pain 02/14/2017  . Cigarette nicotine dependence with nicotine-induced disorder 06/03/2016  . Chronic obstructive pulmonary disease (HCC) 12/04/2015  . Cirrhosis of liver without ascites (HCC)   . Mucosal abnormality of stomach   . Hiatal hernia   . Reflux esophagitis   . Abnormal gallbladder ultrasound 08/17/2015  . Esophageal dysphagia 08/17/2015  . Chest pain, moderate coronary artery risk 06/19/2015  . GERD (gastroesophageal reflux disease) 06/19/2015  . Chronic hepatitis C (HCC) 05/08/2015    . Abdominal pain, epigastric 05/08/2015  . Loss of weight 05/08/2015  . Nausea without vomiting 05/08/2015    Past Surgical History:  Procedure Laterality Date  . BIOPSY N/A 09/04/2015   Procedure: BIOPSY;  Surgeon: Corbin Ade, MD;  Location: [facility];  Service: Endoscopy;  Laterality: N/A;  gastric  . ESOPHAGEAL DILATION N/A 09/04/2015   Procedure:  ESOPHAGEAL DILATION;  Surgeon: Corbin Ade, MD;  Location: [facility];  Service: Endoscopy;  Laterality: N/A;  maloney 56  . ESOPHAGOGASTRODUODENOSCOPY (EGD) WITH PROPOFOL N/A 09/04/2015   Dr. Jena Gauss: mild erosive reflux esophagitis. H.pylori gastritis  . none         Family History  Problem Relation Age of Onset  . Cirrhosis Father        deceased  . Heart disease Sister        3 open heart surgeries  . Heart disease Mother   . Heart disease Other        grandmother  . Colon cancer Neg Hx     Social History   Tobacco Use  . Smoking status: Former Smoker    Packs/day: 2.00    Years: 5.00    Pack years: 10.00    Types: Cigarettes    Start date: 06/06/1976    Quit date: 10/28/1982    Years since quitting: 37.5  . Smokeless tobacco: Never Used  .  Tobacco comment: only smokes marijuana  Vaping Use  . Vaping Use: Never used  Substance Use Topics  . Alcohol use: No    Alcohol/week: 0.0 standard drinks    Comment: quit 01/2014  . Drug use: Yes    Types: Marijuana    Comment: everyday    Home Medications Prior to Admission medications   Medication Sig Start Date End Date Taking? Authorizing Provider  albuterol (VENTOLIN HFA) 108 (90 Base) MCG/ACT inhaler Inhale 2 puffs into the lungs every 6 (six) hours as needed for wheezing or shortness of breath. 03/15/20   Jacquelin Hawking, PA-C  aspirin EC 81 MG tablet Take 1 tablet (81 mg total) by mouth daily. 11/23/19   Antoine Poche, MD  atorvastatin (LIPITOR) 40 MG tablet Take 1 tablet (40 mg total) by mouth daily. 03/15/20   Jacquelin Hawking, PA-C  doxycycline (VIBRAMYCIN) 100  MG capsule Take 1 capsule (100 mg total) by mouth 2 (two) times daily for 7 days. 05/06/20 05/13/20  Maxwell Caul, PA-C  fluticasone-salmeterol (ADVAIR HFA) 230-21 MCG/ACT inhaler Inhale 2 puffs into the lungs 2 (two) times daily. 01/20/20   Jacquelin Hawking, PA-C  nitroGLYCERIN (NITROSTAT) 0.4 MG SL tablet Place 1 tablet (0.4 mg total) under the tongue every 5 (five) minutes as needed for chest pain. May repeat for total 3 doses.  If still having CP, go to ER 12/10/19   Jacquelin Hawking, PA-C  Omeprazole 20 MG TBEC Take 1 tablet (20 mg total) by mouth daily before breakfast. 03/15/20   Jacquelin Hawking, PA-C    Allergies    Patient has no known allergies.  Review of Systems   Review of Systems  Constitutional: Negative for fever.  Gastrointestinal: Positive for nausea. Negative for abdominal pain and vomiting.  Skin: Positive for color change.  Neurological: Negative for headaches.  All other systems reviewed and are negative.   Physical Exam Updated Vital Signs BP (!) 158/99 (BP Location: Right Arm)   Pulse 98   Temp 98.1 F (36.7 C) (Tympanic)   Resp 14   Ht 6' (1.829 m)   Wt 71.2 kg   SpO2 98%   BMI 21.29 kg/m   Physical Exam Vitals and nursing note reviewed. Exam conducted with a chaperone present.  Constitutional:      Appearance: Normal appearance. He is well-developed.  HENT:     Head: Normocephalic and atraumatic.  Eyes:     General: Lids are normal.     Conjunctiva/sclera: Conjunctivae normal.     Pupils: Pupils are equal, round, and reactive to light.  Cardiovascular:     Rate and Rhythm: Normal rate and regular rhythm.     Pulses: Normal pulses.     Heart sounds: Normal heart sounds. No murmur heard.  No friction rub. No gallop.   Pulmonary:     Effort: Pulmonary effort is normal.     Breath sounds: Normal breath sounds.  Abdominal:     Palpations: Abdomen is soft. Abdomen is not rigid.     Tenderness: There is no abdominal tenderness. There is no  guarding.     Comments: Abdomen is soft, non-distended, non-tender. No rigidity, No guarding. No peritoneal signs.  Genitourinary:      Comments: The exam was performed with a chaperone present.  3 x 2 cm area of warmth, erythema, induration with central area that appears to be fluctuant.  No active drainage on the left gluteal cleft area.  No surrounding perianal erythema, edema.  No tenderness palpation  of perineum. No crepitus noted.  Musculoskeletal:        General: Normal range of motion.     Cervical back: Full passive range of motion without pain.  Skin:    General: Skin is warm and dry.     Capillary Refill: Capillary refill takes less than 2 seconds.  Neurological:     Mental Status: He is alert and oriented to person, place, and time.  Psychiatric:        Speech: Speech normal.     ED Results / Procedures / Treatments   Labs (all labs ordered are listed, but only abnormal results are displayed) Labs Reviewed  COMPREHENSIVE METABOLIC PANEL - Abnormal; Notable for the following components:      Result Value   Total Protein 8.5 (*)    All other components within normal limits  CBC WITH DIFFERENTIAL/PLATELET  LIPASE, BLOOD    EKG None  Radiology No results found.  Procedures .Marland Kitchen.Incision and Drainage  Date/Time: 05/06/2020 3:47 PM Performed by: Maxwell CaulLayden, Ermel Verne A, PA-C Authorized by: Maxwell CaulLayden, Emmilynn Marut A, PA-C   Consent:    Consent obtained:  Verbal   Consent given by:  Patient   Risks discussed:  Bleeding, incomplete drainage, pain and damage to other organs   Alternatives discussed:  No treatment Universal protocol:    Procedure explained and questions answered to patient or proxy's satisfaction: yes     Relevant documents present and verified: yes     Test results available and properly labeled: yes     Imaging studies available: yes     Required blood products, implants, devices, and special equipment available: yes     Site/side marked: yes     Immediately  prior to procedure a time out was called: yes     Patient identity confirmed:  Verbally with patient Location:    Type:  Abscess   Location:  Anogenital   Anogenital location:  Pilonidal Pre-procedure details:    Skin preparation:  Betadine Anesthesia (see MAR for exact dosages):    Anesthesia method:  Local infiltration   Local anesthetic:  Lidocaine 2% WITH epi Procedure type:    Complexity:  Complex Procedure details:    Incision types:  Single straight   Incision depth:  Subcutaneous   Scalpel blade:  11   Wound management:  Probed and deloculated, irrigated with saline and extensive cleaning   Drainage:  Purulent and bloody   Drainage amount:  Scant   Wound treatment:  Drain placed   Packing materials:  1/4 in gauze Post-procedure details:    Patient tolerance of procedure:  Tolerated well, no immediate complications Comments:     Once the new wound was anesthetized, was thoroughly extensively cleaned.  The incision was made at the central area where there appeared to be an area that had already opened up and reclosed.  There was a very small amount of drainage that was combined purulent and bloody.  Evaluation of the area showed a more cystic structure in nature.  This area was deloculated with blunt forceps.  No obvious large abscess pocket but did have some cystic debris.   (including critical care time)  Medications Ordered in ED Medications  sodium chloride 0.9 % bolus 1,000 mL (0 mLs Intravenous Stopped 05/06/20 1636)  ondansetron (ZOFRAN) injection 4 mg (4 mg Intravenous Given 05/06/20 1447)  lidocaine-EPINEPHrine (XYLOCAINE W/EPI) 2 %-1:200000 (PF) injection 10 mL (10 mLs Intradermal Given by Other 05/06/20 1447)  doxycycline (VIBRA-TABS) tablet 100 mg (100 mg  Oral Given 05/06/20 1545)    ED Course  I have reviewed the triage vital signs and the nursing notes.  Pertinent labs & imaging results that were available during my care of the patient were reviewed by me and  considered in my medical decision making (see chart for details).    MDM Rules/Calculators/A&P                          59 year old male who presents for evaluation of 5 days of pain, redness, swelling noted to left buttock.  He reports that he has had some subjective fever and chills at home but has not documented any fever.  Comes in today because the pain area is worse.  No preceding trauma, injury.  On initial arrival, he is afebrile, nontoxic-appearing.  Vital signs are stable.  On exam, he has a 3 x 2 cm area of erythema, warmth, induration noted.  He has a central region that appears to have opened up and scabbed over.  Appears slightly fluctuant.  No active drainage.  Exam consistent with pilonidal abscess/cyst.  History/physical exam not concerning for perirectal abscess, perianal abscess.  History/physical exam concerning for Fournier's gangrene.  We will plan for drainage.  Additionally, he reports that he has had some intermittent abdominal pain over the last few months and states that he occasionally will have dry heaves.  This sounds more chronic in nature and has been an ongoing issue.  He does feel like every time he eats, it makes his stomach hurt and therefore he will have occasional dry heaves.  He does feel like this is gotten worse in the last few days.  Benign abdominal exam.  Plan check basic labs.  Lipase is normal.  CBC shows no leukocytosis or anemia.  CMP is unremarkable.  I&D is performed as documented above.  The area appeared to be more of an infected cyst.  Drain was placed on keep it open.  I discussed with patient that he will need to have a GEN surge on an outpatient basis evaluate the area for excision.  We will plan to start him on doxycycline.  Patient with no known drug allergies. At this time, patient exhibits no emergent life-threatening condition that require further evaluation in ED or admission. Patient had ample opportunity for questions and discussion. All  patient's questions were answered with full understanding. Strict return precautions discussed. Patient expresses understanding and agreement to plan.   Portions of this note were generated with Scientist, clinical (histocompatibility and immunogenetics). Dictation errors may occur despite best attempts at proofreading.  Final Clinical Impression(s) / ED Diagnoses Final diagnoses:  Pilonidal cyst    Rx / DC Orders ED Discharge Orders         Ordered    doxycycline (VIBRAMYCIN) 100 MG capsule  2 times daily     Discontinue  Reprint     05/06/20 1612           Maxwell Caul, PA-C 05/07/20 1228    Terrilee Files, MD 05/07/20 [phone]

## 2020-05-06 NOTE — ED Triage Notes (Signed)
Pt c/o abscess to left buttocks x4 days. Denies drainage.   Also c/o splinter in right thigh. States splinter is causing rash in surrounding area.   Also c/o small abscesses to bilateral arms. Denies IV drug use.

## 2020-05-08 ENCOUNTER — Other Ambulatory Visit: Payer: Self-pay

## 2020-05-08 ENCOUNTER — Encounter (HOSPITAL_COMMUNITY): Payer: Self-pay | Admitting: Emergency Medicine

## 2020-05-08 ENCOUNTER — Emergency Department (HOSPITAL_COMMUNITY)
Admission: EM | Admit: 2020-05-08 | Discharge: 2020-05-08 | Disposition: A | Discharge status: Home / Self Care | Payer: Self-pay | Attending: Emergency Medicine | Admitting: Emergency Medicine

## 2020-05-08 DIAGNOSIS — Z79899 Other long term (current) drug therapy: Secondary | ICD-10-CM | POA: Insufficient documentation

## 2020-05-08 DIAGNOSIS — J449 Chronic obstructive pulmonary disease, unspecified: Secondary | ICD-10-CM | POA: Insufficient documentation

## 2020-05-08 DIAGNOSIS — Z48817 Encounter for surgical aftercare following surgery on the skin and subcutaneous tissue: Secondary | ICD-10-CM | POA: Insufficient documentation

## 2020-05-08 DIAGNOSIS — R509 Fever, unspecified: Secondary | ICD-10-CM | POA: Insufficient documentation

## 2020-05-08 DIAGNOSIS — Z7982 Long term (current) use of aspirin: Secondary | ICD-10-CM | POA: Insufficient documentation

## 2020-05-08 DIAGNOSIS — Z09 Encounter for follow-up examination after completed treatment for conditions other than malignant neoplasm: Secondary | ICD-10-CM

## 2020-05-08 DIAGNOSIS — Z87891 Personal history of nicotine dependence: Secondary | ICD-10-CM | POA: Insufficient documentation

## 2020-05-08 NOTE — ED Triage Notes (Signed)
Pt reports he has a cyst to left lower buttock; was seen here Sat; reports he was told to come back for wound check

## 2020-05-08 NOTE — ED Provider Notes (Signed)
[facility] EMERGENCY DEPARTMENT Provider Note   CSN: [id number] Arrival date & time: 05/08/20  1247     History Chief Complaint  Patient presents with  . Wound Check    Edgar Roberts is a 59 y.o. male.  HPI      Edgar Roberts is a 59 y.o. male who presents to the Emergency Department requesting recheck of a cyst to the left upper buttock.  He was seen here on 05/06/2020 I&D was performed and area was packed.  Patient started on doxycycline.  He returns today for recheck.  He complains of pain associated with sitting.  He states the tenderness is spreading toward his rectal area.  He notes some loose stools and decreased appetite since starting the antibiotic.  No vomiting.  Some subjective fever and night sweats. no abdominal pain.  No significant pain with defecation.  Past Medical History:  Diagnosis Date  . Asthma   . Chronic hepatitis C (HCC)   . COPD (chronic obstructive pulmonary disease) (HCC)   . GERD (gastroesophageal reflux disease)     Patient Active Problem List   Diagnosis Date Noted  . Constipation 08/20/2017  . RUQ pain 02/14/2017  . Cigarette nicotine dependence with nicotine-induced disorder 06/03/2016  . Chronic obstructive pulmonary disease (HCC) 12/04/2015  . Cirrhosis of liver without ascites (HCC)   . Mucosal abnormality of stomach   . Hiatal hernia   . Reflux esophagitis   . Abnormal gallbladder ultrasound 08/17/2015  . Esophageal dysphagia 08/17/2015  . Chest pain, moderate coronary artery risk 06/19/2015  . GERD (gastroesophageal reflux disease) 06/19/2015  . Chronic hepatitis C (HCC) 05/08/2015  . Abdominal pain, epigastric 05/08/2015  . Loss of weight 05/08/2015  . Nausea without vomiting 05/08/2015    Past Surgical History:  Procedure Laterality Date  . BIOPSY N/A 09/04/2015   Procedure: BIOPSY;  Surgeon: Corbin Ade, MD;  Location: [facility];  Service: Endoscopy;  Laterality: N/A;  gastric  . ESOPHAGEAL DILATION N/A 09/04/2015    Procedure:  ESOPHAGEAL DILATION;  Surgeon: Corbin Ade, MD;  Location: [facility];  Service: Endoscopy;  Laterality: N/A;  maloney 56  . ESOPHAGOGASTRODUODENOSCOPY (EGD) WITH PROPOFOL N/A 09/04/2015   Dr. Jena Gauss: mild erosive reflux esophagitis. H.pylori gastritis  . none         Family History  Problem Relation Age of Onset  . Cirrhosis Father        deceased  . Heart disease Sister        3 open heart surgeries  . Heart disease Mother   . Heart disease Other        grandmother  . Colon cancer Neg Hx     Social History   Tobacco Use  . Smoking status: Former Smoker    Packs/day: 2.00    Years: 5.00    Pack years: 10.00    Types: Cigarettes    Start date: 06/06/1976    Quit date: 10/28/1982    Years since quitting: 37.5  . Smokeless tobacco: Never Used  . Tobacco comment: only smokes marijuana  Vaping Use  . Vaping Use: Never used  Substance Use Topics  . Alcohol use: No    Alcohol/week: 0.0 standard drinks    Comment: quit 01/2014  . Drug use: Yes    Types: Marijuana    Comment: everyday    Home Medications Prior to Admission medications   Medication Sig Start Date End Date Taking? Authorizing Provider  albuterol (VENTOLIN HFA) 108 (90 Base) MCG/ACT  inhaler Inhale 2 puffs into the lungs every 6 (six) hours as needed for wheezing or shortness of breath. 03/15/20   Jacquelin Hawking, PA-C  aspirin EC 81 MG tablet Take 1 tablet (81 mg total) by mouth daily. 11/23/19   Antoine Poche, MD  atorvastatin (LIPITOR) 40 MG tablet Take 1 tablet (40 mg total) by mouth daily. 03/15/20   Jacquelin Hawking, PA-C  doxycycline (VIBRAMYCIN) 100 MG capsule Take 1 capsule (100 mg total) by mouth 2 (two) times daily for 7 days. 05/06/20 05/13/20  Maxwell Caul, PA-C  fluticasone-salmeterol (ADVAIR HFA) 230-21 MCG/ACT inhaler Inhale 2 puffs into the lungs 2 (two) times daily. 01/20/20   Jacquelin Hawking, PA-C  nitroGLYCERIN (NITROSTAT) 0.4 MG SL tablet Place 1 tablet (0.4 mg total) under the  tongue every 5 (five) minutes as needed for chest pain. May repeat for total 3 doses.  If still having CP, go to ER 12/10/19   Jacquelin Hawking, PA-C  Omeprazole 20 MG TBEC Take 1 tablet (20 mg total) by mouth daily before breakfast. 03/15/20   Jacquelin Hawking, PA-C    Allergies    Patient has no known allergies.  Review of Systems   Review of Systems  Constitutional: Positive for fever. Negative for chills.  Respiratory: Negative for shortness of breath.   Cardiovascular: Negative for chest pain.  Gastrointestinal: Negative for abdominal pain, blood in stool, constipation, nausea and vomiting.  Genitourinary: Negative for difficulty urinating and dysuria.  Musculoskeletal: Negative for arthralgias, back pain and joint swelling.  Skin: Positive for color change.       Abscess to left upper buttock  Neurological: Negative for dizziness and headaches.  Hematological: Negative for adenopathy.    Physical Exam Updated Vital Signs BP (!) 159/93 (BP Location: Right Arm)   Pulse (!) 101   Temp 99.3 F (37.4 C) (Oral)   Resp 16   Ht 6' (1.829 m)   Wt 71 kg   SpO2 99%   BMI 21.23 kg/m   Physical Exam Vitals and nursing note reviewed.  Constitutional:      Appearance: Normal appearance. He is not toxic-appearing.  HENT:     Mouth/Throat:     Mouth: Mucous membranes are moist.  Cardiovascular:     Rate and Rhythm: Normal rate and regular rhythm.     Pulses: Normal pulses.  Pulmonary:     Effort: Pulmonary effort is normal.     Breath sounds: Normal breath sounds.  Abdominal:     General: There is no distension.     Palpations: Abdomen is soft.     Tenderness: There is no abdominal tenderness.  Genitourinary:    Comments: Patient with abscess of the left upper buttock near the gluteal cleft.  Previous I&D with iodoform packing in place.  Patient has significant induration surrounding the area that extends to the perirectal area.  There is erythema as well.   Musculoskeletal:         General: Normal range of motion.  Skin:    General: Skin is warm.     Capillary Refill: Capillary refill takes less than 2 seconds.  Neurological:     General: No focal deficit present.     Mental Status: He is alert.     Motor: No weakness.     ED Results / Procedures / Treatments   Labs (all labs ordered are listed, but only abnormal results are displayed) Labs Reviewed - No data to display  EKG None  Radiology No results  found.  Procedures Procedures (including critical care time)  Medications Ordered in ED Medications - No data to display  ED Course  I have reviewed the triage vital signs and the nursing notes.  Pertinent labs & imaging results that were available during my care of the patient were reviewed by me and considered in my medical decision making (see chart for details).    MDM Rules/Calculators/A&P                          Patient with pilonidal cyst of the left upper gluteal region.  I&D performed on 05/06/2020.  Patient currently taking doxycycline.  Reports fevers and night sweats.  Low-grade fever here.  Abdomen is soft without peritoneal signs.  Exam is concerning that abscess is tracking toward his rectum.  I have recommended that patient have repeat labs and CT of his pelvis for further evaluation.  Patient states that he cannot stay due to prior commitment and needs to leave at this time.  He agrees to return tomorrow morning for the CT.  Packing removed by me w/o difficulty.  He is nontoxic appearing, ambulatory.  No life-threatening process at this time.   Final Clinical Impression(s) / ED Diagnoses Final diagnoses:  Encounter for recheck of abscess following incision and drainage    Rx / DC Orders ED Discharge Orders    None       Pauline Aus, PA-C 05/09/20 0046    Vanetta Mulders, MD 05/10/20 [phone]

## 2020-05-08 NOTE — Discharge Instructions (Addendum)
As discussed, it is important that you continue to take the antibiotic as directed.  Warm water soaks 2-3 times a day to the affected area.  Return here tomorrow as you will likely need a CT of your pelvis to further evaluate your symptoms

## 2020-05-09 ENCOUNTER — Emergency Department (HOSPITAL_COMMUNITY): Payer: Self-pay

## 2020-05-09 ENCOUNTER — Emergency Department (HOSPITAL_COMMUNITY)
Admission: EM | Admit: 2020-05-09 | Discharge: 2020-05-09 | Disposition: A | Discharge status: Home / Self Care | Payer: Self-pay | Attending: Emergency Medicine | Admitting: Emergency Medicine

## 2020-05-09 ENCOUNTER — Encounter (HOSPITAL_COMMUNITY): Payer: Self-pay | Admitting: Emergency Medicine

## 2020-05-09 DIAGNOSIS — Z79899 Other long term (current) drug therapy: Secondary | ICD-10-CM | POA: Insufficient documentation

## 2020-05-09 DIAGNOSIS — Z7982 Long term (current) use of aspirin: Secondary | ICD-10-CM | POA: Insufficient documentation

## 2020-05-09 DIAGNOSIS — J45909 Unspecified asthma, uncomplicated: Secondary | ICD-10-CM | POA: Insufficient documentation

## 2020-05-09 DIAGNOSIS — Z87891 Personal history of nicotine dependence: Secondary | ICD-10-CM | POA: Insufficient documentation

## 2020-05-09 DIAGNOSIS — J449 Chronic obstructive pulmonary disease, unspecified: Secondary | ICD-10-CM | POA: Insufficient documentation

## 2020-05-09 DIAGNOSIS — K611 Rectal abscess: Secondary | ICD-10-CM | POA: Insufficient documentation

## 2020-05-09 DIAGNOSIS — Z4800 Encounter for change or removal of nonsurgical wound dressing: Secondary | ICD-10-CM | POA: Insufficient documentation

## 2020-05-09 LAB — COMPREHENSIVE METABOLIC PANEL
ALT: 20 U/L (ref 0–44)
AST: 21 U/L (ref 15–41)
Albumin: 4.1 g/dL (ref 3.5–5.0)
Alkaline Phosphatase: 68 U/L (ref 38–126)
Anion gap: 11 (ref 5–15)
BUN: 14 mg/dL (ref 6–20)
CO2: 24 mmol/L (ref 22–32)
Calcium: 9.3 mg/dL (ref 8.9–10.3)
Chloride: 100 mmol/L (ref 98–111)
Creatinine, Ser: 0.8 mg/dL (ref 0.61–1.24)
GFR calc Af Amer: >60 mL/min (ref >60)
GFR calc non Af Amer: >60 mL/min (ref >60)
Glucose, Bld: 93 mg/dL (ref 70–99)
Potassium: 3.4 mmol/L — ABNORMAL LOW (ref 3.5–5.1)
Sodium: 135 mmol/L (ref 135–145)
Total Bilirubin: 0.7 mg/dL (ref 0.3–1.2)
Total Protein: 8 g/dL (ref 6.5–8.1)

## 2020-05-09 LAB — CBC WITH DIFFERENTIAL/PLATELET
Abs Immature Granulocytes: 0.01 10*3/uL (ref 0.00–0.07)
Basophils Absolute: 0.1 10*3/uL (ref 0.0–0.1)
Basophils Relative: 1 %
Eosinophils Absolute: 0.3 10*3/uL (ref 0.0–0.5)
Eosinophils Relative: 6 %
HCT: 47.3 % (ref 39.0–52.0)
Hemoglobin: 16.5 g/dL (ref 13.0–17.0)
Immature Granulocytes: 0 %
Lymphocytes Relative: 25 %
Lymphs Abs: 1.5 10*3/uL (ref 0.7–4.0)
MCH: 32.4 pg (ref 26.0–34.0)
MCHC: 34.9 g/dL (ref 30.0–36.0)
MCV: 92.7 fL (ref 80.0–100.0)
Monocytes Absolute: 0.7 10*3/uL (ref 0.1–1.0)
Monocytes Relative: 11 %
Neutro Abs: 3.4 10*3/uL (ref 1.7–7.7)
Neutrophils Relative %: 57 %
Platelets: 257 10*3/uL (ref 150–400)
RBC: 5.1 MIL/uL (ref 4.22–5.81)
RDW: 12.6 % (ref 11.5–15.5)
WBC: 5.9 10*3/uL (ref 4.0–10.5)
nRBC: 0 % (ref 0.0–0.2)

## 2020-05-09 MED ORDER — IOHEXOL 300 MG/ML  SOLN
100.0000 mL | Freq: Once | INTRAMUSCULAR | Status: AC | PRN
  Administered 2020-05-09: 100 mL via INTRAVENOUS

## 2020-05-09 MED ORDER — METRONIDAZOLE 500 MG PO TABS
500.0000 mg | ORAL_TABLET | Freq: Two times a day (BID) | ORAL | 0 refills | Status: DC
Start: 2020-05-09 — End: 2020-06-15

## 2020-05-09 NOTE — ED Triage Notes (Signed)
Patient has cyst to left sacrum area, was told to have CT scan on yesterday, but left due to wait, back for wound recheck and CT Scan.

## 2020-05-09 NOTE — ED Provider Notes (Signed)
[facility] EMERGENCY DEPARTMENT Provider Note   CSN: [id number] Arrival date & time: 05/09/20  1696     History Chief Complaint  Patient presents with  . Wound Check    Sacrual area    Edgar Roberts is a 59 y.o. male.  HPI      Edgar Roberts is a 59 y.o. male who returns to the Emergency Department at my request.  Patient was seen yesterday by me for abscess recheck and packing removal.  He was noted to have a questionable pilonidal cyst that was incised and drained on 05/06/2020.  Patient was started on doxycycline.  He noted having pain to his perineum yesterday.  I recommended patient have labs and CT of his pelvis yesterday, but patient was unable to stay for work-up due to prior commitment.  He returns today for further evaluation.  He reports night sweats, chills and subjective fever at home.  No fever today on arrival.  He denies abdominal pain, vomiting, dysuria or significant diarrhea.    Past Medical History:  Diagnosis Date  . Asthma   . Chronic hepatitis C (HCC)   . COPD (chronic obstructive pulmonary disease) (HCC)   . GERD (gastroesophageal reflux disease)     Patient Active Problem List   Diagnosis Date Noted  . Constipation 08/20/2017  . RUQ pain 02/14/2017  . Cigarette nicotine dependence with nicotine-induced disorder 06/03/2016  . Chronic obstructive pulmonary disease (HCC) 12/04/2015  . Cirrhosis of liver without ascites (HCC)   . Mucosal abnormality of stomach   . Hiatal hernia   . Reflux esophagitis   . Abnormal gallbladder ultrasound 08/17/2015  . Esophageal dysphagia 08/17/2015  . Chest pain, moderate coronary artery risk 06/19/2015  . GERD (gastroesophageal reflux disease) 06/19/2015  . Chronic hepatitis C (HCC) 05/08/2015  . Abdominal pain, epigastric 05/08/2015  . Loss of weight 05/08/2015  . Nausea without vomiting 05/08/2015    Past Surgical History:  Procedure Laterality Date  . BIOPSY N/A 09/04/2015   Procedure: BIOPSY;   Surgeon: Corbin Ade, MD;  Location: [facility];  Service: Endoscopy;  Laterality: N/A;  gastric  . ESOPHAGEAL DILATION N/A 09/04/2015   Procedure:  ESOPHAGEAL DILATION;  Surgeon: Corbin Ade, MD;  Location: [facility];  Service: Endoscopy;  Laterality: N/A;  maloney 56  . ESOPHAGOGASTRODUODENOSCOPY (EGD) WITH PROPOFOL N/A 09/04/2015   Dr. Jena Gauss: mild erosive reflux esophagitis. H.pylori gastritis  . none         Family History  Problem Relation Age of Onset  . Cirrhosis Father        deceased  . Heart disease Sister        3 open heart surgeries  . Heart disease Mother   . Heart disease Other        grandmother  . Colon cancer Neg Hx     Social History   Tobacco Use  . Smoking status: Former Smoker    Packs/day: 2.00    Years: 5.00    Pack years: 10.00    Types: Cigarettes    Start date: 06/06/1976    Quit date: 10/28/1982    Years since quitting: 37.5  . Smokeless tobacco: Never Used  . Tobacco comment: only smokes marijuana  Vaping Use  . Vaping Use: Never used  Substance Use Topics  . Alcohol use: No    Alcohol/week: 0.0 standard drinks    Comment: quit 01/2014  . Drug use: Yes    Types: Marijuana    Comment: everyday  Home Medications Prior to Admission medications   Medication Sig Start Date End Date Taking? Authorizing Provider  albuterol (VENTOLIN HFA) 108 (90 Base) MCG/ACT inhaler Inhale 2 puffs into the lungs every 6 (six) hours as needed for wheezing or shortness of breath. 03/15/20  Yes Jacquelin Hawking, PA-C  aspirin EC 81 MG tablet Take 1 tablet (81 mg total) by mouth daily. 11/23/19  Yes BranchDorothe Pea, MD  atorvastatin (LIPITOR) 40 MG tablet Take 1 tablet (40 mg total) by mouth daily. 03/15/20  Yes Jacquelin Hawking, PA-C  doxycycline (VIBRAMYCIN) 100 MG capsule Take 1 capsule (100 mg total) by mouth 2 (two) times daily for 7 days. 05/06/20 05/13/20 Yes Maxwell Caul, PA-C  fluticasone-salmeterol (ADVAIR HFA) 230-21 MCG/ACT inhaler Inhale 2 puffs into  the lungs 2 (two) times daily. 01/20/20  Yes Jacquelin Hawking, PA-C  Omeprazole 20 MG TBEC Take 1 tablet (20 mg total) by mouth daily before breakfast. 03/15/20  Yes Jacquelin Hawking, PA-C  nitroGLYCERIN (NITROSTAT) 0.4 MG SL tablet Place 1 tablet (0.4 mg total) under the tongue every 5 (five) minutes as needed for chest pain. May repeat for total 3 doses.  If still having CP, go to ER 12/10/19   Jacquelin Hawking, PA-C    Allergies    Patient has no known allergies.  Review of Systems   Review of Systems  Constitutional: Positive for chills and fever.  Respiratory: Negative for shortness of breath.   Cardiovascular: Negative for chest pain.  Gastrointestinal: Negative for abdominal pain, nausea and vomiting.  Genitourinary: Negative for difficulty urinating and dysuria.  Musculoskeletal: Negative for arthralgias and joint swelling.  Skin: Positive for color change.       Abscess   Neurological: Negative for syncope, weakness and numbness.  Hematological: Negative for adenopathy.    Physical Exam Updated Vital Signs BP (!) 160/101 (BP Location: Right Arm)   Pulse 98   Temp 98.6 F (37 C) (Oral)   Resp 20   Ht 6' (1.829 m)   Wt 66.7 kg   SpO2 100%   BMI 19.94 kg/m   Physical Exam Vitals and nursing note reviewed.  Constitutional:      General: He is not in acute distress.    Appearance: Normal appearance. He is well-developed. He is not toxic-appearing.  HENT:     Mouth/Throat:     Mouth: Mucous membranes are moist.  Cardiovascular:     Rate and Rhythm: Normal rate and regular rhythm.     Pulses: Normal pulses.  Pulmonary:     Effort: Pulmonary effort is normal. No respiratory distress.     Breath sounds: Normal breath sounds.  Genitourinary:    Rectum: Tenderness present.     Comments: Abscess to the left superior gluteal cleft, previous I&D.  Erythema extending along the gluteal fold to the perirectal area.  Moderate induration noted.  Mild tenderness of the  rectum. Musculoskeletal:        General: Normal range of motion.  Skin:    General: Skin is warm.     Capillary Refill: Capillary refill takes less than 2 seconds.     Findings: No erythema.  Neurological:     General: No focal deficit present.     Mental Status: He is alert and oriented to person, place, and time.     Sensory: No sensory deficit.     Motor: No weakness or abnormal muscle tone.     ED Results / Procedures / Treatments   Labs (all labs  ordered are listed, but only abnormal results are displayed) Labs Reviewed  COMPREHENSIVE METABOLIC PANEL - Abnormal; Notable for the following components:      Result Value   Potassium 3.4 (*)    All other components within normal limits  CULTURE, BLOOD (ROUTINE X 2)  CULTURE, BLOOD (ROUTINE X 2)  CBC WITH DIFFERENTIAL/PLATELET    EKG None  Radiology CT PELVIS W CONTRAST  Result Date: 05/09/2020 CLINICAL DATA:  Perirectal abscess.  Pain. EXAM: CT PELVIS WITH CONTRAST TECHNIQUE: Multidetector CT imaging of the pelvis was performed using the standard protocol following the bolus administration of intravenous contrast. CONTRAST:  OMNIPAQUE IOHEXOL 300 MG/ML  SOLN COMPARISON:  None. FINDINGS: Urinary Tract: The distal ureters, urinary bladder, and urethra are within normal limits. Bowel:  Visualized large and small bowel are unremarkable. Vascular/Lymphatic: Atherosclerotic calcifications are present in the aorta and iliac vessels without aneurysm. Reproductive: Prostate calcifications are noted. Prostate is enlarged, measuring to 4.8 cm in transverse diameter. Other: Fat herniates into the inguinal canals bilaterally without associated bowel. Left posterior perirectal mass phlegm a tori changes are present. Ill-defined fluid collection is noted posteriorly, measuring 1.9 x 2.4 x 1.8 cm. Musculoskeletal: No suspicious bone lesions identified. IMPRESSION: 1. 1.9 x 2.4 x 1.8 cm left posterior perirectal abscess. This is still somewhat  ill-defined. It is unclear if this is a drainable abscess. 2. Enlarged prostate gland. 3. Fat herniates into the inguinal canals bilaterally without associated bowel. 4. Aortic Atherosclerosis (ICD10-I70.0). Electronically Signed   By: Marin Roberts M.D.   On: 05/09/2020 14:30    Procedures Procedures (including critical care time)  Medications Ordered in ED Medications  iohexol (OMNIPAQUE) 300 MG/ML solution 100 mL (100 mLs Intravenous Contrast Given 05/09/20 1403)    ED Course  I have reviewed the triage vital signs and the nursing notes.  Pertinent labs & imaging results that were available during my care of the patient were reviewed by me and considered in my medical decision making (see chart for details).    MDM Rules/Calculators/A&P                          Patient seen here yesterday by me.  Recommended that he have CT of his pelvis yesterday due to concern for perirectal abscess.  Patient had prior commitment I was unable to stay to have imaging done.  Advised patient to return today.  Will obtain labs and CT of the pelvis  CT shows 2 x 2 centimeter perirectal abscess.  Patient is nontoxic-appearing.  Afebrile.  No leukocytosis.  No abdominal pain or vomiting.  He is currently taking doxycycline twice daily.   1450 consulted Dr. Lovell Sheehan.  He recommends to add Flagyl to patient's antibiotics.  He will see patient in office at 10:15 AM on Thursday, 05/11/2020 for follow-up.  Patient agrees to continue his doxycycline and warm water soaks.   Final Clinical Impression(s) / ED Diagnoses Final diagnoses:  Perirectal abscess    Rx / DC Orders ED Discharge Orders    None       Pauline Aus, PA-C 05/09/20 1534    Geoffery Lyons, MD 05/09/20 2258

## 2020-05-09 NOTE — Discharge Instructions (Addendum)
Continue taking your doxycycline as directed.  Add Flagyl as directed twice daily.  Continue warm water soaks 2-3 times a day.  You have an appointment to see the surgeon, Dr. Lovell Sheehan on Thursday, 05/12/2020 at 10:15 AM in his office.

## 2020-05-11 ENCOUNTER — Encounter: Payer: Self-pay | Admitting: General Surgery

## 2020-05-11 ENCOUNTER — Other Ambulatory Visit: Payer: Self-pay

## 2020-05-11 ENCOUNTER — Ambulatory Visit (INDEPENDENT_AMBULATORY_CARE_PROVIDER_SITE_OTHER): Payer: Self-pay | Admitting: General Surgery

## 2020-05-11 VITALS — BP 124/77 | HR 85 | Temp 98.1°F | Resp 16 | Ht 72.0 in | Wt 153.0 lb

## 2020-05-11 DIAGNOSIS — K610 Anal abscess: Secondary | ICD-10-CM

## 2020-05-11 MED ORDER — HYDROCODONE-ACETAMINOPHEN 5-325 MG PO TABS: 1.0000 | ORAL_TABLET | ORAL | 0 refills | Status: AC | PRN

## 2020-05-11 NOTE — Progress Notes (Signed)
Edgar Roberts; [id number]; January 28, 1961   HPI Patient is a 59 year old white male who was referred to my care by the emergency room and Jacquelin Hawking for evaluation treatment of a perianal abscess.  Patient was being followed in the emergency room after undergoing I&D of what initially was felt to be a pilonidal abscess.  He then developed perianal pain and swelling.  A CT scan of the abdomen showed a perianal abscess.  It is in close proximity to the previous I&D of the abscess.  Patient was started on Vibramycin and Flagyl was added.  He has been soaking in the tub since then.  The drainage has decreased.  He still has some tenderness in this region.  No fevers have been noted. Past Medical History:  Diagnosis Date  . Asthma   . Chronic hepatitis C (HCC)   . COPD (chronic obstructive pulmonary disease) (HCC)   . GERD (gastroesophageal reflux disease)     Past Surgical History:  Procedure Laterality Date  . BIOPSY N/A 09/04/2015   Procedure: BIOPSY;  Surgeon: Corbin Ade, MD;  Location: [facility];  Service: Endoscopy;  Laterality: N/A;  gastric  . ESOPHAGEAL DILATION N/A 09/04/2015   Procedure:  ESOPHAGEAL DILATION;  Surgeon: Corbin Ade, MD;  Location: [facility];  Service: Endoscopy;  Laterality: N/A;  maloney 56  . ESOPHAGOGASTRODUODENOSCOPY (EGD) WITH PROPOFOL N/A 09/04/2015   Dr. Jena Gauss: mild erosive reflux esophagitis. H.pylori gastritis  . none      Family History  Problem Relation Age of Onset  . Cirrhosis Father        deceased  . Heart disease Sister        3 open heart surgeries  . Heart disease Mother   . Heart disease Other        grandmother  . Colon cancer Neg Hx     Current Outpatient Medications on File Prior to Visit  Medication Sig Dispense Refill  . albuterol (VENTOLIN HFA) 108 (90 Base) MCG/ACT inhaler Inhale 2 puffs into the lungs every 6 (six) hours as needed for wheezing or shortness of breath. 6.7 g 1  . aspirin EC 81 MG tablet Take 1 tablet (81 mg total) by  mouth daily. 90 tablet 3  . atorvastatin (LIPITOR) 40 MG tablet Take 1 tablet (40 mg total) by mouth daily. 90 tablet 1  . doxycycline (VIBRAMYCIN) 100 MG capsule Take 1 capsule (100 mg total) by mouth 2 (two) times daily for 7 days. 14 capsule 0  . fluticasone-salmeterol (ADVAIR HFA) 230-21 MCG/ACT inhaler Inhale 2 puffs into the lungs 2 (two) times daily. 3 Inhaler 3  . metroNIDAZOLE (FLAGYL) 500 MG tablet Take 1 tablet (500 mg total) by mouth 2 (two) times daily. 20 tablet 0  . nitroGLYCERIN (NITROSTAT) 0.4 MG SL tablet Place 1 tablet (0.4 mg total) under the tongue every 5 (five) minutes as needed for chest pain. May repeat for total 3 doses.  If still having CP, go to ER 25 tablet 3  . Omeprazole 20 MG TBEC Take 1 tablet (20 mg total) by mouth daily before breakfast. 90 tablet 1   No current facility-administered medications on file prior to visit.    No Known Allergies  Social History   Substance and Sexual Activity  Alcohol Use No  . Alcohol/week: 0.0 standard drinks   Comment: quit 01/2014    Social History   Tobacco Use  Smoking Status Former Smoker  . Packs/day: 2.00  . Years: 5.00  . Pack years:  10.00  . Types: Cigarettes  . Start date: 06/06/1976  . Quit date: 10/28/1982  . Years since quitting: 37.5  Smokeless Tobacco Never Used  Tobacco Comment   only smokes marijuana    Review of Systems  Constitutional: Positive for malaise/fatigue.  HENT: Negative.   Eyes: Positive for blurred vision.  Respiratory: Positive for cough, shortness of breath and wheezing.   Cardiovascular: Negative.   Gastrointestinal: Positive for abdominal pain, heartburn and nausea.  Genitourinary: Negative.   Musculoskeletal: Positive for back pain and neck pain.  Skin: Negative.   Neurological: Positive for dizziness, sensory change and headaches.  Endo/Heme/Allergies: Negative.   Psychiatric/Behavioral: Negative.     Objective   Vitals:   05/11/20 1020  BP: 124/77  Pulse: 85   Resp: 16  Temp: 98.1 F (36.7 C)  SpO2: 97%    Physical Exam Vitals reviewed.  Constitutional:      Appearance: Normal appearance. He is normal weight. He is not ill-appearing.  HENT:     Head: Normocephalic and atraumatic.  Cardiovascular:     Rate and Rhythm: Normal rate and regular rhythm.     Heart sounds: Normal heart sounds. No murmur heard.  No friction rub. No gallop.   Pulmonary:     Effort: Pulmonary effort is normal. No respiratory distress.     Breath sounds: Normal breath sounds. No stridor. No wheezing, rhonchi or rales.  Genitourinary:    Comments: Indurated area at the 5 o'clock position of the anus, 2 cm from the anal verge with tunneling to the incised opening just caudad to the coccyx.  I was able to clean this out with a Q-tip and peroxide.  No purulent drainage present. Skin:    General: Skin is warm and dry.  Neurological:     Mental Status: He is alert and oriented to person, place, and time.     Assessment  Perianal abscess, resolving Plan   No need for surgical intervention at this time.  Patient was told to continue antibiotics and to keep wound clean with soaking.  I will see him in 1 week for follow-up.

## 2020-05-14 LAB — CULTURE, BLOOD (ROUTINE X 2)
Culture: NO GROWTH
Culture: NO GROWTH
Special Requests: ADEQUATE
Special Requests: ADEQUATE

## 2020-05-18 ENCOUNTER — Ambulatory Visit (INDEPENDENT_AMBULATORY_CARE_PROVIDER_SITE_OTHER): Payer: Self-pay | Admitting: General Surgery

## 2020-05-18 ENCOUNTER — Encounter: Payer: Self-pay | Admitting: General Surgery

## 2020-05-18 ENCOUNTER — Other Ambulatory Visit: Payer: Self-pay

## 2020-05-18 VITALS — BP 152/111 | HR 104 | Temp 98.2°F | Resp 14 | Ht 72.0 in | Wt 151.0 lb

## 2020-05-18 DIAGNOSIS — K610 Anal abscess: Secondary | ICD-10-CM

## 2020-05-18 NOTE — Progress Notes (Signed)
Subjective:     Edgar Roberts  Here for follow-up of perianal abscess. Patient states the drainage has significantly decreased. He denies any fevers. He does complain of his blood pressure running on the high side for which he is seeing his primary care physician today. Objective:    BP (!) 152/111    Pulse (!) 104    Temp 98.2 F (36.8 C) (Oral)    Resp 14    Ht 6' (1.829 m)    Wt 151 lb (68.5 kg)    SpO2 98%    BMI 20.48 kg/m   General:  alert, cooperative and no distress  His perianal abscess has almost resolved. A small granulating wound opening is noted. Silver nitrate was applied. No purulent drainage present.     Assessment:    Perianal abscess, resolved    Plan:   Continue to keep wound clean and dry. Instructed to follow-up with me as needed. No need for surgical intervention.

## 2020-06-15 ENCOUNTER — Other Ambulatory Visit: Payer: Self-pay

## 2020-06-15 ENCOUNTER — Ambulatory Visit: Payer: Self-pay | Admitting: Physician Assistant

## 2020-06-15 ENCOUNTER — Encounter: Payer: Self-pay | Admitting: Physician Assistant

## 2020-06-15 VITALS — BP 120/70 | HR 96 | Temp 98.8°F | Ht 72.0 in | Wt 159.3 lb

## 2020-06-15 DIAGNOSIS — L02414 Cutaneous abscess of left upper limb: Secondary | ICD-10-CM

## 2020-06-15 DIAGNOSIS — K219 Gastro-esophageal reflux disease without esophagitis: Secondary | ICD-10-CM

## 2020-06-15 DIAGNOSIS — J449 Chronic obstructive pulmonary disease, unspecified: Secondary | ICD-10-CM

## 2020-06-15 DIAGNOSIS — E785 Hyperlipidemia, unspecified: Secondary | ICD-10-CM

## 2020-06-15 DIAGNOSIS — F1290 Cannabis use, unspecified, uncomplicated: Secondary | ICD-10-CM

## 2020-06-15 MED ORDER — SULFAMETHOXAZOLE-TRIMETHOPRIM 800-160 MG PO TABS
1.0000 | ORAL_TABLET | Freq: Two times a day (BID) | ORAL | 0 refills | Status: DC
Start: 2020-06-15 — End: 2020-09-14

## 2020-06-15 NOTE — Progress Notes (Signed)
BP 120/70   Pulse 96   Temp 98.8 F (37.1 C)   SpO2 95%    Subjective:    Patient ID: Edgar Roberts, male    DOB: [birth year], 59 y.o.   MRN: [id number]  HPI: Edgar Roberts is a 59 y.o. male presenting on 06/15/2020 for COPD and Hyperlipidemia   HPI    Pt had a negative covid 19 screening questionnaire.    Pt is 59yoM who presents for routine follow up of COPD and dyslipidemia.  He is doing well but reports "bump" that popped up a few days ago he thinks it might be a spider bite.  He admits to picking at it as well as multiple other places on BUE.    Pt says sacral wound healed.  He forgot to get labs drawn ...  Again.     Relevant past medical, surgical, family and social history reviewed and updated as indicated. Interim medical history since our last visit reviewed. Allergies and medications reviewed and updated.   Current Outpatient Medications:  .  albuterol (VENTOLIN HFA) 108 (90 Base) MCG/ACT inhaler, Inhale 2 puffs into the lungs every 6 (six) hours as needed for wheezing or shortness of breath., Disp: 6.7 g, Rfl: 1 .  aspirin EC 81 MG tablet, Take 1 tablet (81 mg total) by mouth daily., Disp: 90 tablet, Rfl: 3 .  atorvastatin (LIPITOR) 40 MG tablet, Take 1 tablet (40 mg total) by mouth daily., Disp: 90 tablet, Rfl: 1 .  fluticasone-salmeterol (ADVAIR HFA) 230-21 MCG/ACT inhaler, Inhale 2 puffs into the lungs 2 (two) times daily., Disp: 3 Inhaler, Rfl: 3 .  nitroGLYCERIN (NITROSTAT) 0.4 MG SL tablet, Place 1 tablet (0.4 mg total) under the tongue every 5 (five) minutes as needed for chest pain. May repeat for total 3 doses.  If still having CP, go to ER, Disp: 25 tablet, Rfl: 3 .  Omeprazole 20 MG TBEC, Take 1 tablet (20 mg total) by mouth daily before breakfast., Disp: 90 tablet, Rfl: 1   Review of Systems  Per HPI unless specifically indicated above     Objective:    BP 120/70   Pulse 96   Temp 98.8 F (37.1 C)   SpO2 95%   Wt Readings from Last 3  Encounters:  05/18/20 151 lb (68.5 kg)  05/11/20 153 lb (69.4 kg)  05/09/20 147 lb (66.7 kg)    Physical Exam Vitals reviewed.  Constitutional:      General: He is not in acute distress.    Appearance: He is well-developed. He is not ill-appearing.  HENT:     Head: Normocephalic and atraumatic.  Cardiovascular:     Rate and Rhythm: Normal rate and regular rhythm.  Pulmonary:     Effort: Pulmonary effort is normal.     Breath sounds: Normal breath sounds. No wheezing.  Abdominal:     General: Bowel sounds are normal.     Palpations: Abdomen is soft.     Tenderness: There is no abdominal tenderness.  Musculoskeletal:     Cervical back: Neck supple.     Right lower leg: No edema.     Left lower leg: No edema.     Comments: BUE with TNTC scabs with excoriations.  The LUE has a bump with mild erythema and excoriation- no fluctuance.    Lymphadenopathy:     Cervical: No cervical adenopathy.     Upper Body:     Left upper body: Axillary adenopathy present.  Comments: soliatary enlarged soft mobile node L axilla  Skin:    General: Skin is warm and dry.  Neurological:     Mental Status: He is alert and oriented to person, place, and time.  Psychiatric:        Behavior: Behavior normal.            Assessment & Plan:   Encounter Diagnoses  Name Primary?  . Chronic obstructive pulmonary disease, unspecified COPD type (HCC) Yes  . Hyperlipidemia, unspecified hyperlipidemia type   . Marijuana use   . Gastroesophageal reflux disease, unspecified whether esophagitis present   . Cutaneous abscess of left upper extremity       -discussed with pt that L axillary node likely inflamed due to early abscess on forearm.  Will cover with septra ds and pt can use warm compresses on it.  He is urged to avoid picking at it and his other scabs -discussed with pt that he really needs to get his fasting labs done- they have not been checked since January.  He says he will try to  remember -no medication changes today -pt is given sample proventil and qvar to last until he get his shipment from [facility] of proventil and advair -pt to follow up 3 months.  He is to contact office sooner prn

## 2020-06-29 ENCOUNTER — Ambulatory Visit: Payer: Self-pay | Admitting: Cardiology

## 2020-09-06 ENCOUNTER — Other Ambulatory Visit (HOSPITAL_COMMUNITY)
Admission: RE | Admit: 2020-09-06 | Discharge: 2020-09-06 | Disposition: A | Discharge status: Home / Self Care | Payer: Self-pay | Source: Ambulatory Visit | Attending: Physician Assistant | Admitting: Physician Assistant

## 2020-09-06 ENCOUNTER — Other Ambulatory Visit: Payer: Self-pay

## 2020-09-06 DIAGNOSIS — E785 Hyperlipidemia, unspecified: Secondary | ICD-10-CM | POA: Insufficient documentation

## 2020-09-06 LAB — LIPID PANEL
Cholesterol: 164 mg/dL (ref 0–200)
HDL: 35 mg/dL — ABNORMAL LOW (ref >40)
LDL Cholesterol: 117 mg/dL — ABNORMAL HIGH (ref 0–99)
Total CHOL/HDL Ratio: 4.7 RATIO
Triglycerides: 62 mg/dL (ref <150)
VLDL: 12 mg/dL (ref 0–40)

## 2020-09-06 LAB — COMPREHENSIVE METABOLIC PANEL
ALT: 16 U/L (ref 0–44)
AST: 18 U/L (ref 15–41)
Albumin: 4.4 g/dL (ref 3.5–5.0)
Alkaline Phosphatase: 84 U/L (ref 38–126)
Anion gap: 7 (ref 5–15)
BUN: 6 mg/dL (ref 6–20)
CO2: 28 mmol/L (ref 22–32)
Calcium: 9.4 mg/dL (ref 8.9–10.3)
Chloride: 99 mmol/L (ref 98–111)
Creatinine, Ser: 0.8 mg/dL (ref 0.61–1.24)
GFR, Estimated: >60 mL/min (ref >60)
Glucose, Bld: 108 mg/dL — ABNORMAL HIGH (ref 70–99)
Potassium: 4.3 mmol/L (ref 3.5–5.1)
Sodium: 134 mmol/L — ABNORMAL LOW (ref 135–145)
Total Bilirubin: 0.8 mg/dL (ref 0.3–1.2)
Total Protein: 8.3 g/dL — ABNORMAL HIGH (ref 6.5–8.1)

## 2020-09-14 ENCOUNTER — Encounter: Payer: Self-pay | Admitting: Physician Assistant

## 2020-09-14 ENCOUNTER — Ambulatory Visit: Payer: Self-pay | Admitting: Physician Assistant

## 2020-09-14 VITALS — BP 136/78 | HR 84 | Temp 97.7°F | Ht 72.0 in | Wt 163.6 lb

## 2020-09-14 DIAGNOSIS — K4090 Unilateral inguinal hernia, without obstruction or gangrene, not specified as recurrent: Secondary | ICD-10-CM

## 2020-09-14 DIAGNOSIS — J449 Chronic obstructive pulmonary disease, unspecified: Secondary | ICD-10-CM

## 2020-09-14 DIAGNOSIS — L02213 Cutaneous abscess of chest wall: Secondary | ICD-10-CM

## 2020-09-14 DIAGNOSIS — F424 Excoriation (skin-picking) disorder: Secondary | ICD-10-CM

## 2020-09-14 DIAGNOSIS — F1290 Cannabis use, unspecified, uncomplicated: Secondary | ICD-10-CM

## 2020-09-14 DIAGNOSIS — E785 Hyperlipidemia, unspecified: Secondary | ICD-10-CM

## 2020-09-14 MED ORDER — SULFAMETHOXAZOLE-TRIMETHOPRIM 800-160 MG PO TABS
1.0000 | ORAL_TABLET | Freq: Two times a day (BID) | ORAL | 0 refills | Status: DC
Start: 2020-09-14 — End: 2020-10-05

## 2020-09-14 NOTE — Patient Instructions (Signed)
Skin Abscess  A skin abscess is an infected area on or under your skin that contains a collection of pus and other material. An abscess may also be called a furuncle, carbuncle, or boil. An abscess can occur in or on almost any part of your body. Some abscesses break open (rupture) on their own. Most continue to get worse unless they are treated. The infection can spread deeper into the body and eventually into your blood, which can make you feel ill. Treatment usually involves draining the abscess. What are the causes? An abscess occurs when germs, like bacteria, pass through your skin and cause an infection. This may be caused by:  A scrape or cut on your skin.  A puncture wound through your skin, including a needle injection or insect bite.  Blocked oil or sweat glands.  Blocked and infected hair follicles.  A cyst that forms beneath your skin (sebaceous cyst) and becomes infected. What increases the risk? This condition is more likely to develop in people who:  Have a weak body defense system (immune system).  Have diabetes.  Have dry and irritated skin.  Get frequent injections or use illegal IV drugs.  Have a foreign body in a wound, such as a splinter.  Have problems with their lymph system or veins. What are the signs or symptoms? Symptoms of this condition include:  A painful, firm bump under the skin.  A bump with pus at the top. This may break through the skin and drain. Other symptoms include:  Redness surrounding the abscess site.  Warmth.  Swelling of the lymph nodes (glands) near the abscess.  Tenderness.  A sore on the skin. How is this diagnosed? This condition may be diagnosed based on:  A physical exam.  Your medical history.  A sample of pus. This may be used to find out what is causing the infection.  Blood tests.  Imaging tests, such as an ultrasound, CT scan, or MRI. How is this treated? A small abscess that drains on its own may  not need treatment. Treatment for larger abscesses may include:  Moist heat or heat pack applied to the area several times a day.  A procedure to drain the abscess (incision and drainage).  Antibiotic medicines. For a severe abscess, you may first get antibiotics through an IV and then change to antibiotics by mouth. Follow these instructions at home: Medicines   Take over-the-counter and prescription medicines only as told by your health care provider.  If you were prescribed an antibiotic medicine, take it as told by your health care provider. Do not stop taking the antibiotic even if you start to feel better. Abscess care   If you have an abscess that has not drained, apply heat to the affected area. Use the heat source that your health care provider recommends, such as a moist heat pack or a heating pad. ? Place a towel between your skin and the heat source. ? Leave the heat on for 20-30 minutes. ? Remove the heat if your skin turns bright red. This is especially important if you are unable to feel pain, heat, or cold. You may have a greater risk of getting burned.  Follow instructions from your health care provider about how to take care of your abscess. Make sure you: ? Cover the abscess with a bandage (dressing). ? Change your dressing or gauze as told by your health care provider. ? Wash your hands with soap and water before you change the   dressing or gauze. If soap and water are not available, use hand sanitizer.  Check your abscess every day for signs of a worsening infection. Check for: ? More redness, swelling, or pain. ? More fluid or blood. ? Warmth. ? More pus or a bad smell. General instructions  To avoid spreading the infection: ? Do not share personal care items, towels, or hot tubs with others. ? Avoid making skin contact with other people.  Keep all follow-up visits as told by your health care provider. This is important. Contact a health care provider if  you have:  More redness, swelling, or pain around your abscess.  More fluid or blood coming from your abscess.  Warm skin around your abscess.  More pus or a bad smell coming from your abscess.  A fever.  Muscle aches.  Chills or a general ill feeling. Get help right away if you:  Have severe pain.  See red streaks on your skin spreading away from the abscess. Summary  A skin abscess is an infected area on or under your skin that contains a collection of pus and other material.  A small abscess that drains on its own may not need treatment.  Treatment for larger abscesses may include having a procedure to drain the abscess and taking an antibiotic. This information is not intended to replace advice given to you by your health care provider. Make sure you discuss any questions you have with your health care provider. Document Revised: 02/04/2019 Document Reviewed: 11/27/2017 Elsevier Patient Education  2020 [employer]  

## 2020-09-14 NOTE — Progress Notes (Signed)
BP 136/78   Pulse 84   Temp 97.7 F (36.5 C)   Ht 6' (1.829 m)   Wt 163 lb 9.6 oz (74.2 kg)   SpO2 97%   BMI 22.19 kg/m    Subjective:    Patient ID: Edgar Roberts, male    DOB: Jan 13, 1961, 59 y.o.   MRN: [id number]  HPI: Edgar Roberts is a 59 y.o. male presenting on 09/14/2020 for COPD and Hyperlipidemia   HPI    Pt had a negative covid 19 screening questionnaire.   Chief Complaint  Patient presents with  . COPD  . Hyperlipidemia     Pt "keeps popping up" bumps , axillary, shoulder, knee- couple months  He is Still smoking MJ. He doesn't smoke cigaretes. He is Not drinking.  He says his hernia is getting bigger.  It hurts every now and then.  It is liminting his lifting.    He is Still working maintenance.       Relevant past medical, surgical, family and social history reviewed and updated as indicated. Interim medical history since our last visit reviewed. Allergies and medications reviewed and updated.   Current Outpatient Medications:  .  aspirin EC 81 MG tablet, Take 1 tablet (81 mg total) by mouth daily., Disp: 90 tablet, Rfl: 3 .  atorvastatin (LIPITOR) 40 MG tablet, Take 1 tablet (40 mg total) by mouth daily., Disp: 90 tablet, Rfl: 1 .  fluticasone-salmeterol (ADVAIR HFA) 230-21 MCG/ACT inhaler, Inhale 2 puffs into the lungs 2 (two) times daily., Disp: 3 Inhaler, Rfl: 3 .  nitroGLYCERIN (NITROSTAT) 0.4 MG SL tablet, Place 1 tablet (0.4 mg total) under the tongue every 5 (five) minutes as needed for chest pain. May repeat for total 3 doses.  If still having CP, go to ER, Disp: 25 tablet, Rfl: 3 .  Omeprazole 20 MG TBEC, Take 1 tablet (20 mg total) by mouth daily before breakfast., Disp: 90 tablet, Rfl: 1 .  albuterol (VENTOLIN HFA) 108 (90 Base) MCG/ACT inhaler, Inhale 2 puffs into the lungs every 6 (six) hours as needed for wheezing or shortness of breath. (Patient not taking: Reported on 09/14/2020), Disp: 6.7 g, Rfl: 1     Review of  Systems  Per HPI unless specifically indicated above     Objective:    BP 136/78   Pulse 84   Temp 97.7 F (36.5 C)   Ht 6' (1.829 m)   Wt 163 lb 9.6 oz (74.2 kg)   SpO2 97%   BMI 22.19 kg/m   Wt Readings from Last 3 Encounters:  09/14/20 163 lb 9.6 oz (74.2 kg)  06/15/20 159 lb 4.8 oz (72.3 kg)  05/18/20 151 lb (68.5 kg)    Physical Exam Vitals reviewed.  Constitutional:      Appearance: He is well-developed.  HENT:     Head: Normocephalic and atraumatic.  Cardiovascular:     Rate and Rhythm: Normal rate and regular rhythm.  Pulmonary:     Effort: Pulmonary effort is normal.     Breath sounds: Normal breath sounds. No wheezing.  Abdominal:     General: Bowel sounds are normal.     Palpations: Abdomen is soft.     Tenderness: There is no abdominal tenderness.  Genitourinary:    Comments: Right hernia.  Not iincarcerated.  Mildly tender. Musculoskeletal:     Cervical back: Neck supple.  Lymphadenopathy:     Cervical: No cervical adenopathy.  Skin:    General: Skin is warm and dry.  Comments: Abscess L anterior shoulder over clavicle area and another smaller one L axilla.  Skin all over is just picked, excoriated, oozing- B arms, abdomen.    Neurological:     Mental Status: He is alert and oriented to person, place, and time.  Psychiatric:        Behavior: Behavior normal.        Results for orders placed or performed during the hospital encounter of 09/06/20  Lipid panel  Result Value Ref Range   Cholesterol 164 0 - 200 mg/dL   Triglycerides 62 <867 mg/dL   HDL 35 (L) >67 mg/dL   Total CHOL/HDL Ratio 4.7 RATIO   VLDL 12 0 - 40 mg/dL   LDL Cholesterol 209 (H) 0 - 99 mg/dL  Comprehensive metabolic panel  Result Value Ref Range   Sodium 134 (L) 135 - 145 mmol/L   Potassium 4.3 3.5 - 5.1 mmol/L   Chloride 99 98 - 111 mmol/L   CO2 28 22 - 32 mmol/L   Glucose, Bld 108 (H) 70 - 99 mg/dL   BUN 6 6 - 20 mg/dL   Creatinine, Ser 4.70 0.61 - 1.24 mg/dL    Calcium 9.4 8.9 - 96.2 mg/dL   Total Protein 8.3 (H) 6.5 - 8.1 g/dL   Albumin 4.4 3.5 - 5.0 g/dL   AST 18 15 - 41 U/L   ALT 16 0 - 44 U/L   Alkaline Phosphatase 84 38 - 126 U/L   Total Bilirubin 0.8 0.3 - 1.2 mg/dL   GFR, Estimated >83 >66 mL/min   Anion gap 7 5 - 15      Assessment & Plan:    Encounter Diagnoses  Name Primary?  . Chronic obstructive pulmonary disease, unspecified COPD type (HCC) Yes  . Hyperlipidemia, unspecified hyperlipidemia type   . Marijuana use   . Cutaneous abscess of chest wall   . Inguinal hernia without obstruction or gangrene, recurrence not specified, unspecified laterality   . Picking own skin       -reviewed labs with pt -May increase statin in near future.  Pt to watch lowfat diet -pt to Continue current rx. -discussed risks/benefits of I&D and pt would like to proceed. -rx septra.  He is to soak the area in bathtub/apply warm compresses -pt educated to shower daily with antibacterial soap and he MUST stop picking at his skin.  Discussed how this introduces bacteria and causes him to get repeated abscesses -will refer to surgeon for hernia -will contact [facility] to check on financial assistance -pt to follow up 3 months.  He is to contact office sooner prn     I&D: Obtained verbal informed consent.  Area draped and cleaned with hibiclens.  Instilled 1% lidocaine, opened with #11 blade.   Minimal pus expressed.  Dry sterile dressing applied.

## 2020-09-26 ENCOUNTER — Ambulatory Visit: Payer: Self-pay | Admitting: General Surgery

## 2020-10-05 ENCOUNTER — Ambulatory Visit (INDEPENDENT_AMBULATORY_CARE_PROVIDER_SITE_OTHER): Payer: Self-pay | Admitting: General Surgery

## 2020-10-05 ENCOUNTER — Encounter: Payer: Self-pay | Admitting: General Surgery

## 2020-10-05 ENCOUNTER — Other Ambulatory Visit: Payer: Self-pay

## 2020-10-05 VITALS — BP 149/92 | HR 100 | Temp 98.3°F | Resp 16 | Ht 72.0 in | Wt 168.0 lb

## 2020-10-05 DIAGNOSIS — K4090 Unilateral inguinal hernia, without obstruction or gangrene, not specified as recurrent: Secondary | ICD-10-CM

## 2020-10-05 NOTE — Progress Notes (Signed)
[facility] History and Physical  Reason for Referral: Right inguinal hernia  Referring Physician:  Jacquelin Hawking, PA-C    Chief Complaint    Hernia      Edgar Roberts is a 59 y.o. male.  HPI: Edgar Roberts is a very sweet 59 yo who has been having worsening bulge and discomfort in the right groin area for several weeks now. He was evaluated by his PCP and sent for discussion about his hernia. He is otherwise pretty healthy but does report some chest pain on occasion and says he takes nitroglycerin every few months. He says this mostly occurs with anxiety. He had a stress test 2021 that showed a inferior infarct but no current ischemia, reported low risk study.    He has noticed the bulge more recently and has a dog that he lifts. He works and is pretty active at work too. Family history is significant for cardiac disease.   He also complains of a large splinter in his right thigh. He says that a few years ago he brushed a paint stirrer over this thigh and got a splinter that he can visualize. It has never been infected or caused issues.   Past Medical History:  Diagnosis Date  . Asthma   . Chronic hepatitis C (HCC)   . COPD (chronic obstructive pulmonary disease) (HCC)   . GERD (gastroesophageal reflux disease)     Past Surgical History:  Procedure Laterality Date  . BIOPSY N/A 09/04/2015   Procedure: BIOPSY;  Surgeon: Corbin Ade, MD;  Location: [facility];  Service: Endoscopy;  Laterality: N/A;  gastric  . ESOPHAGEAL DILATION N/A 09/04/2015   Procedure:  ESOPHAGEAL DILATION;  Surgeon: Corbin Ade, MD;  Location: [facility];  Service: Endoscopy;  Laterality: N/A;  maloney 56  . ESOPHAGOGASTRODUODENOSCOPY (EGD) WITH PROPOFOL N/A 09/04/2015   Dr. Jena Gauss: mild erosive reflux esophagitis. H.pylori gastritis  . none      Family History  Problem Relation Age of Onset  . Cirrhosis Father        deceased  . Heart disease Sister        3 open heart surgeries  . Heart  disease Mother   . Heart disease Other        grandmother  . Colon cancer Neg Hx     Social History   Tobacco Use  . Smoking status: Former Smoker    Packs/day: 2.00    Years: 5.00    Pack years: 10.00    Types: Cigarettes    Start date: 06/06/1976    Quit date: 10/28/1982    Years since quitting: 37.9  . Smokeless tobacco: Never Used  . Tobacco comment: only smokes marijuana  Vaping Use  . Vaping Use: Never used  Substance Use Topics  . Alcohol use: No    Alcohol/week: 0.0 standard drinks    Comment: quit 01/2014  . Drug use: Yes    Types: Marijuana    Comment: everyday    Medications: I have reviewed the patient's current medications. Allergies as of 10/05/2020   No Known Allergies     Medication List       Accurate as of October 05, 2020  1:42 PM. If you have any questions, ask your nurse or doctor.        STOP taking these medications   sulfamethoxazole-trimethoprim 800-160 MG tablet Commonly known as: BACTRIM DS Stopped by: Lucretia Roers, MD     TAKE these medications   albuterol  108 (90 Base) MCG/ACT inhaler Commonly known as: VENTOLIN HFA Inhale 2 puffs into the lungs every 6 (six) hours as needed for wheezing or shortness of breath.   aspirin EC 81 MG tablet Take 1 tablet (81 mg total) by mouth daily.   atorvastatin 40 MG tablet Commonly known as: LIPITOR Take 1 tablet (40 mg total) by mouth daily.   fluticasone-salmeterol 230-21 MCG/ACT inhaler Commonly known as: ADVAIR HFA Inhale 2 puffs into the lungs 2 (two) times daily.   nitroGLYCERIN 0.4 MG SL tablet Commonly known as: NITROSTAT Place 1 tablet (0.4 mg total) under the tongue every 5 (five) minutes as needed for chest pain. May repeat for total 3 doses.  If still having CP, go to ER   Omeprazole 20 MG Tbec Take 1 tablet (20 mg total) by mouth daily before breakfast.        ROS:  A comprehensive review of systems was negative except for: Respiratory: positive for SOB,  coughing Gastrointestinal: positive for abdominal pain, nausea and reflux symptoms Musculoskeletal: positive for neck pain  Blood pressure (!) 149/92, pulse 100, temperature 98.3 F (36.8 C), temperature source Oral, resp. rate 16, height 6' (1.829 m), weight 168 lb (76.2 kg), SpO2 95 %. Physical Exam Vitals reviewed.  HENT:     Head: Normocephalic.     Nose: Nose normal.     Mouth/Throat:     Mouth: Mucous membranes are moist.  Eyes:     Extraocular Movements: Extraocular movements intact.     Pupils: Pupils are equal, round, and reactive to light.  Cardiovascular:     Rate and Rhythm: Normal rate and regular rhythm.  Pulmonary:     Effort: Pulmonary effort is normal.     Breath sounds: Normal breath sounds.  Abdominal:     General: There is no distension.     Palpations: Abdomen is soft.     Tenderness: There is no abdominal tenderness.     Hernia: A hernia is present. Hernia is present in the left inguinal area and right inguinal area.     Comments: Reducible bilateral hernias, tender on right  Musculoskeletal:        General: No swelling. Normal range of motion.     Cervical back: Normal range of motion.     Comments: Right thigh area of some discoloration, possible splinter, no signs of infection  Neurological:     General: No focal deficit present.     Mental Status: He is alert and oriented to person, place, and time.  Psychiatric:        Mood and Affect: Mood normal.        Behavior: Behavior normal.        Thought Content: Thought content normal.        Judgment: Judgment normal.     Results: CT a/p 04/2020- bilateral fat containing inguinal hernia  Assessment & Plan:  Edgar Roberts is a 59 y.o. male with bilateral inguinal hernia. The right side is causing him discomfort.  No obstructive symptoms. 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 Cardiology felt that his chest pain was not cardiac in nature which aligns  with his reported anxiety but he does report taking nitroglycerin with improvement.   Will get him risk stratified given surgery to ensure no additional testing needed.  Discussed the risk and benefits including, bleeding, infection, use of mesh, risk of recurrence, risk of nerve damage causing numbness or changes  in sensation, risk of damage to the cord structures. The patient understands the risk and benefits of repair with mesh, and has decided to proceed.  We also discussed open versus laparoscopic surgery and the use of mesh. We discussed that I do open repairs with mesh, and that this is considered equivalent to laparoscopic surgery. We discussed reasons for opting for laparoscopic surgery including if a bilateral repair is needed or if a patient has a recurrence after an open repair.  Discussed preop COVID testing.   All questions were answered to the satisfaction of the patient.     Lucretia Roers 10/05/2020, 1:42 PM

## 2020-10-05 NOTE — Patient Instructions (Signed)
Referral to Cardiology for preoperative risk stratification.   Inguinal Hernia, Adult An inguinal hernia is when fat or your intestines push through a weak spot in a muscle where your leg meets your lower belly (groin). This causes a rounded lump (bulge). This kind of hernia could also be:  In your scrotum, if you are male.  In folds of skin around your vagina, if you are male. There are three types of inguinal hernias. These include:  Hernias that can be pushed back into the belly (are reducible). This type rarely causes pain.  Hernias that cannot be pushed back into the belly (are incarcerated).  Hernias that cannot be pushed back into the belly and lose their blood supply (are strangulated). This type needs emergency surgery. If you do not have symptoms, you may not need treatment. If you have symptoms or a large hernia, you may need surgery. Follow these instructions at home: Lifestyle  Do these things if told by your doctor so you do not have trouble pooping (constipation): ? Drink enough fluid to keep your pee (urine) pale yellow. ? Eat foods that have a lot of fiber. These include fresh fruits and vegetables, whole grains, and beans. ? Limit foods that are high in fat and processed sugars. These include foods that are fried or sweet. ? Take medicine for trouble pooping.  Avoid lifting heavy objects.  Avoid standing for long amounts of time.  Do not use any products that contain nicotine or tobacco. These include cigarettes and e-cigarettes. If you need help quitting, ask your doctor.  Stay at a healthy weight. General instructions  You may try to push your hernia in by very gently pressing on it when you are lying down. Do not try to force the bulge back in if it will not push in easily.  Watch your hernia for any changes in shape, size, or color. Tell your doctor if you see any changes.  Take over-the-counter and prescription medicines only as told by your  doctor.  Keep all follow-up visits as told by your doctor. This is important. Contact a doctor if:  You have a fever.  You have new symptoms.  Your symptoms get worse. Get help right away if:  The area where your leg meets your lower belly has: ? Pain that gets worse suddenly. ? A bulge that gets bigger suddenly, and it does not get smaller after that. ? A bulge that turns red or purple. ? A bulge that is painful when you touch it.  You are a man, and your scrotum: ? Suddenly feels painful. ? Suddenly changes in size.  You cannot push the hernia in by very gently pressing on it when you are lying down. Do not try to force the bulge back in if it will not push in easily.  You feel sick to your stomach (nauseous), and that feeling does not go away.  You throw up (vomit), and that keeps happening.  You have a fast heartbeat.  You cannot poop (have a bowel movement) or pass gas. These symptoms may be an emergency. Do not wait to see if the symptoms will go away. Get medical help right away. Call your local emergency services (911 in the [place] Summary  An inguinal hernia is when fat or your intestines push through a weak spot in a muscle where your leg meets your lower belly (groin). This causes a rounded lump (bulge).  If you do not have symptoms, you may not need treatment.  If you have symptoms or a large hernia, you may need surgery.  Avoid lifting heavy objects. Also avoid standing for long amounts of time.  Do not try to force the bulge back in if it will not push in easily. This information is not intended to replace advice given to you by your health care provider. Make sure you discuss any questions you have with your health care provider. Document Revised: 11/15/2017 Document Reviewed: 07/16/2017 Elsevier Patient Education  2020 [employer] Inc[employer]   Open Hernia Repair, Adult  Open hernia repair is a surgical procedure to fix a hernia. A hernia occurs when an internal  organ or tissue pushes out through a weak spot in the abdominal wall muscles. Hernias commonly occur in the groin and around the navel. Most hernias tend to get worse over time. Often, surgery is done to prevent the hernia from becoming bigger, uncomfortable, or an emergency. Emergency surgery may be needed if abdominal contents get stuck in the opening (incarcerated hernia) or the blood supply gets cut off (strangulated hernia). In an open repair, an incision is made in the abdomen to perform the surgery. Tell a health care provider about:  Any allergies you have.  All medicines you are taking, including vitamins, herbs, eye drops, creams, and over-the-counter medicines.  Any problems you or family members have had with anesthetic medicines.  Any blood or bone disorders you have.  Any surgeries you have had.  Any medical conditions you have, including any recent cold or flu symptoms.  Whether you are pregnant or may be pregnant. What are the risks? Generally, this is a safe procedure. However, problems may occur, including:  Long-lasting (chronic) pain.  Bleeding.  Infection.  Damage to the testicle. This can cause shrinking or swelling.  Damage to the bladder, blood vessels, intestine, or nerves near the hernia.  Trouble passing urine.  Allergic reactions to medicines.  Return of the hernia. Medicines  Ask your health care provider about: ? Changing or stopping your regular medicines. This is especially important if you are taking diabetes medicines or blood thinners. ? Taking medicines such as aspirin and ibuprofen. These medicines can thin your blood. Do not take these medicines before your procedure if your health care provider instructs you not to.  You may be given antibiotic medicine to help prevent infection. General instructions  You may have blood tests or imaging studies.  Ask your health care provider how your surgical site will be marked or  identified.  If you smoke, do not smoke for at least 2 weeks before your procedure or for as long as told by your health care provider.  Let your health care provider know if you develop a cold or any infection before your surgery.  Plan to have someone take you home from the hospital or clinic.  If you will be going home right after the procedure, plan to have someone with you for 24 hours. What happens during the procedure?  To reduce your risk of infection: ? Your health care team will wash or sanitize their hands. ? Your skin will be washed with soap. ? Hair may be removed from the surgical area.  An IV tube will be inserted into one of your veins.  You will be given one or more of the following: ? A medicine to help you relax (sedative). ? A medicine to numb the area (local anesthetic). ? A medicine to make you fall asleep (general anesthetic).  Your surgeon will make an  incision over the hernia.  The tissues of the hernia will be moved back into place.  The edges of the hernia may be stitched together.  The opening in the abdominal muscles will be closed with stitches (sutures). Or, your surgeon will place a mesh patch made of manmade (synthetic) material over the opening.  The incision will be closed.  A bandage (dressing) may be placed over the incision. The procedure may vary among health care providers and hospitals. What happens after the procedure?  Your blood pressure, heart rate, breathing rate, and blood oxygen level will be monitored until the medicines you were given have worn off.  You may be given medicine for pain.  Do not drive for 24 hours if you received a sedative. This information is not intended to replace advice given to you by your health care provider. Make sure you discuss any questions you have with your health care provider. Document Revised: 09/26/2017 Document Reviewed: 03/27/2016 Elsevier Patient Education  2020 [employer].

## 2020-10-22 NOTE — Progress Notes (Signed)
Cardiology Office Note  Date: 10/23/2020   ID: Edgar Roberts, DOB 16-Jan-1961, MRN [id number]  PCP:  Jacquelin Hawking, PA-C  Cardiologist:  Dina Rich, MD Electrophysiologist:  None   Chief Complaint: Preop clearance  History of Present Illness: Edgar Roberts is a 59 y.o. male with a history of chest pain, COPD, chronic hepatitis C, GERD, history of tobacco use, HLD.  Last encounter with Dr. Wyline Mood 01/13/2020.  Reported 3 to 4 months of" feeling funny" in left chest radiating to arm and hand.  It tended to occur with activity.  Could cause some diaphoresis and nausea plus shortness of breath.  Could be worse with deep breaths.  Lasting about 10 to 20 minutes occurring 3 times per week.  Some DOE with walking uphill.  Had a previous negative stress test January 2021 with evidence of inferior/inferoseptal infarct, no current ischemia, low risk study. ASCVD risk score 7 % per Dr Wyline Mood. Nuclear stress suggested prior infarct. Lipitor 40 mg po was started.   He is being referred by Dr Larae Grooms MD for pre op cardiac clearance for bilateral inguinal hernia repair.  He denies any recent acute illnesses or hospitalizations.  Denies any anginal or exertional symptoms other than chronic shortness of breath from his COPD.  No PND or orthopnea, no palpitations or arrhythmias, CVA or TIA-like symptoms, orthostatic symptoms.  Denies any claudication-like symptoms, DVT or PE-like symptoms, or lower extremity edema.  Past Medical History:  Diagnosis Date  . Asthma   . Chronic hepatitis C (HCC)   . COPD (chronic obstructive pulmonary disease) (HCC)   . GERD (gastroesophageal reflux disease)     Past Surgical History:  Procedure Laterality Date  . BIOPSY N/A 09/04/2015   Procedure: BIOPSY;  Surgeon: Corbin Ade, MD;  Location: [facility];  Service: Endoscopy;  Laterality: N/A;  gastric  . ESOPHAGEAL DILATION N/A 09/04/2015   Procedure:  ESOPHAGEAL DILATION;  Surgeon: Corbin Ade, MD;   Location: [facility];  Service: Endoscopy;  Laterality: N/A;  maloney 56  . ESOPHAGOGASTRODUODENOSCOPY (EGD) WITH PROPOFOL N/A 09/04/2015   Dr. Jena Gauss: mild erosive reflux esophagitis. H.pylori gastritis  . none      Current Outpatient Medications  Medication Sig Dispense Refill  . albuterol (VENTOLIN HFA) 108 (90 Base) MCG/ACT inhaler Inhale 2 puffs into the lungs every 6 (six) hours as needed for wheezing or shortness of breath. 6.7 g 1  . aspirin EC 81 MG tablet Take 1 tablet (81 mg total) by mouth daily. 90 tablet 3  . atorvastatin (LIPITOR) 40 MG tablet Take 1 tablet (40 mg total) by mouth daily. 90 tablet 1  . fluticasone-salmeterol (ADVAIR HFA) 230-21 MCG/ACT inhaler Inhale 2 puffs into the lungs 2 (two) times daily. 3 Inhaler 3  . nitroGLYCERIN (NITROSTAT) 0.4 MG SL tablet Place 1 tablet (0.4 mg total) under the tongue every 5 (five) minutes as needed for chest pain. May repeat for total 3 doses.  If still having CP, go to ER 25 tablet 3  . Omeprazole 20 MG TBEC Take 1 tablet (20 mg total) by mouth daily before breakfast. 90 tablet 1   No current facility-administered medications for this visit.   Allergies:  Patient has no known allergies.   Social History: The patient  reports that he quit smoking about 38 years ago. His smoking use included cigarettes. He started smoking about 44 years ago. He has a 10.00 pack-year smoking history. He has never used smokeless tobacco. He reports current drug use.  Drug: Marijuana. He reports that he does not drink alcohol.   Family History: The patient's family history includes Cirrhosis in his father; Heart disease in his mother, sister, and another family member.   ROS:  Please see the history of present illness. Otherwise, complete review of systems is positive for none.  All other systems are reviewed and negative.   Physical Exam: VS:  BP 122/80   Pulse 84   Ht 6' (1.829 m)   Wt 166 lb (75.3 kg)   SpO2 96%   BMI 22.51 kg/m , BMI Body mass  index is 22.51 kg/m.  Wt Readings from Last 3 Encounters:  10/23/20 166 lb (75.3 kg)  10/05/20 168 lb (76.2 kg)  09/14/20 163 lb 9.6 oz (74.2 kg)    General: Patient appears comfortable at rest. Neck: Supple, no elevated JVP or carotid bruits, no thyromegaly. Lungs: Clear to auscultation, nonlabored breathing at rest. Cardiac: Regular rate and rhythm, no S3 or significant systolic murmur, no pericardial rub. Extremities: No pitting edema, distal pulses 2+. Skin: Warm and dry. Musculoskeletal: No kyphosis. Neuropsychiatric: Alert and oriented x3, affect grossly appropriate.  ECG:  An ECG dated 10/23/2020 was personally reviewed today and demonstrated:  Normal sinus rhythm rate of 80  Recent Labwork: 05/09/2020: Hemoglobin 16.5; Platelets 257 09/06/2020: ALT 16; AST 18; BUN 6; Creatinine, Ser 0.80; Potassium 4.3; Sodium 134     Component Value Date/Time   CHOL 164 09/06/2020 0842   TRIG 62 09/06/2020 0842   HDL 35 (L) 09/06/2020 0842   CHOLHDL 4.7 09/06/2020 0842   VLDL 12 09/06/2020 0842   LDLCALC 117 (H) 09/06/2020 0842    Other Studies Reviewed Today:  06/2015 GXT  There was no ST segment deviation noted during stress.  Clinically and electrically negative for ischemia. Very good exercise tolerance   Jan 2021 nuclear stress test  There was no ST segment deviation noted during stress.  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  This is a low risk study. There is no current myocardium at jeopardy  The left ventricular ejection fraction is low normal (50%).    Assessment and Plan:  1. Encounter for pre-operative cardiovascular clearance   2. Chest pain, moderate coronary artery risk   3. Mixed hyperlipidemia    1. Encounter for pre-operative cardiovascular clearance Patient has pending bilateral inguinal hernia repair with Dr. Larae Grooms.  He denies any recent anginal or exertional symptoms.  Does have some chronic  shortness of breath from COPD.  His Duke activity status index score is 58.2 giving him a functional capacity and METS at 9.89.  His revised cardiac risk index score is 0 placing him at a perioperative risk of major cardiac event at 0.4%.  From a cardiac standpoint patient is cleared to undergo bilateral inguinal hernia repair under general anesthesia from a cardiac standpoint.  2. Chest pain, moderate coronary artery risk Denies any recent anginal or exertional symptoms.  Continue aspirin 81 mg daily.  Continue nitroglycerin as needed.  3. Mixed hyperlipidemia Lipid panel 09/06/2020 TC 164, TG 62, HDL 35, LDL 117.  Continue atorvastatin 40 mg p.o. daily.  Medication Adjustments/Labs and Tests Ordered: Current medicines are reviewed at length with the patient today.  Concerns regarding medicines are outlined above.   Disposition: Follow-up with Dr. Wyline Mood or APP 6 months  Signed, Rennis Harding, NP 10/23/2020 4:49 PM    [facility] [address] [place], [place], [place] [address] Phone: [phone]; Fax: [phone])  [phone] 

## 2020-10-23 ENCOUNTER — Encounter: Payer: Self-pay | Admitting: Family Medicine

## 2020-10-23 ENCOUNTER — Other Ambulatory Visit: Payer: Self-pay

## 2020-10-23 ENCOUNTER — Ambulatory Visit (INDEPENDENT_AMBULATORY_CARE_PROVIDER_SITE_OTHER): Payer: Self-pay | Admitting: Family Medicine

## 2020-10-23 VITALS — BP 122/80 | HR 84 | Ht 72.0 in | Wt 166.0 lb

## 2020-10-23 DIAGNOSIS — E782 Mixed hyperlipidemia: Secondary | ICD-10-CM

## 2020-10-23 DIAGNOSIS — R079 Chest pain, unspecified: Secondary | ICD-10-CM

## 2020-10-23 DIAGNOSIS — Z01810 Encounter for preprocedural cardiovascular examination: Secondary | ICD-10-CM

## 2020-10-23 NOTE — Patient Instructions (Signed)
Medication Instructions:  Your physician recommends that you continue on your current medications as directed. Please refer to the Current Medication list given to you today.  *If you need a refill on your cardiac medications before your next appointment, please call your pharmacy*   Lab Work: None Today If you have labs (blood work) drawn today and your tests are completely normal, you will receive your results only by: . MyChart Message (if you have MyChart) OR . A paper copy in the mail If you have any lab test that is abnormal or we need to change your treatment, we will call you to review the results.   Testing/Procedures: None Today    Follow-Up: At [facility], you and your health needs are our priority.  As part of our continuing mission to provide you with exceptional heart care, we have created designated Provider Care Teams.  These Care Teams include your primary Cardiologist (physician) and Advanced Practice Providers (APPs -  Physician Assistants and Nurse Practitioners) who all work together to provide you with the care you need, when you need it.  We recommend signing up for the patient portal called "MyChart".  Sign up information is provided on this After Visit Summary.  MyChart is used to connect with patients for Virtual Visits (Telemedicine).  Patients are able to view lab/test results, encounter notes, upcoming appointments, etc.  Non-urgent messages can be sent to your provider as well.   To learn more about what you can do with MyChart, go to [email or web address].    Your next appointment:   6 month(s)  The format for your next appointment:   In Person  Provider:   Andy Quinn, NP   Other Instructions None Today     

## 2020-12-05 ENCOUNTER — Other Ambulatory Visit: Payer: Self-pay | Admitting: Physician Assistant

## 2020-12-05 DIAGNOSIS — E785 Hyperlipidemia, unspecified: Secondary | ICD-10-CM

## 2020-12-05 DIAGNOSIS — Z125 Encounter for screening for malignant neoplasm of prostate: Secondary | ICD-10-CM

## 2020-12-21 ENCOUNTER — Ambulatory Visit: Payer: Self-pay | Admitting: Physician Assistant

## 2020-12-21 ENCOUNTER — Other Ambulatory Visit: Payer: Self-pay

## 2020-12-21 ENCOUNTER — Encounter: Payer: Self-pay | Admitting: Physician Assistant

## 2020-12-21 ENCOUNTER — Other Ambulatory Visit: Payer: Self-pay | Admitting: Physician Assistant

## 2020-12-21 VITALS — BP 132/81 | HR 80 | Temp 97.9°F

## 2020-12-21 DIAGNOSIS — K4090 Unilateral inguinal hernia, without obstruction or gangrene, not specified as recurrent: Secondary | ICD-10-CM

## 2020-12-21 DIAGNOSIS — F1290 Cannabis use, unspecified, uncomplicated: Secondary | ICD-10-CM

## 2020-12-21 DIAGNOSIS — Z1211 Encounter for screening for malignant neoplasm of colon: Secondary | ICD-10-CM

## 2020-12-21 DIAGNOSIS — E785 Hyperlipidemia, unspecified: Secondary | ICD-10-CM

## 2020-12-21 DIAGNOSIS — J449 Chronic obstructive pulmonary disease, unspecified: Secondary | ICD-10-CM

## 2020-12-21 NOTE — Progress Notes (Signed)
BP 132/81   Pulse 80   Temp 97.9 F (36.6 C)   SpO2 97%    Subjective:    Patient ID: Edgar Roberts, male    DOB: 1961-05-08, 60 y.o.   MRN: [id number]  HPI: Edgar Roberts is a 60 y.o. male presenting on 12/21/2020 for No chief complaint on file.   HPI   Pt had a negative covid 19 screening questionnaire.   Pt is 59yoM with copd and dyslipidemia who presents for routine follow up  He didn't get labs done  He says he isn't taking any meds.  He says he got a letter saying that his medassist was renewed and he'll be getting meds soon.  He doesn't smoke cigarettes any longer but continue to smoke MJ  He got 1st 2 covid vaccinations but not yet boosted  Pt says he saw surgeon in December for hernia and she sent him for cardiac clearance which he got but says he still hasn't heard back from surgeon.       Relevant past medical, surgical, family and social history reviewed and updated as indicated. Interim medical history since our last visit reviewed. Allergies and medications reviewed and updated.  CURRENT MEDS: None   Current Outpatient Medications:  .  aspirin EC 81 MG tablet, Take 1 tablet (81 mg total) by mouth daily., Disp: 90 tablet, Rfl: 3 .  albuterol (VENTOLIN HFA) 108 (90 Base) MCG/ACT inhaler, Inhale 2 puffs into the lungs every 6 (six) hours as needed for wheezing or shortness of breath. (Patient not taking: Reported on 12/21/2020), Disp: 6.7 g, Rfl: 1 .  atorvastatin (LIPITOR) 40 MG tablet, Take 1 tablet (40 mg total) by mouth daily. (Patient not taking: Reported on 12/21/2020), Disp: 90 tablet, Rfl: 1 .  fluticasone-salmeterol (ADVAIR HFA) 230-21 MCG/ACT inhaler, Inhale 2 puffs into the lungs 2 (two) times daily. (Patient not taking: Reported on 12/21/2020), Disp: 3 Inhaler, Rfl: 3 .  nitroGLYCERIN (NITROSTAT) 0.4 MG SL tablet, Place 1 tablet (0.4 mg total) under the tongue every 5 (five) minutes as needed for chest pain. May repeat for total 3 doses.  If still  having CP, go to ER (Patient not taking: Reported on 12/21/2020), Disp: 25 tablet, Rfl: 3 .  Omeprazole 20 MG TBEC, Take 1 tablet (20 mg total) by mouth daily before breakfast. (Patient not taking: Reported on 12/21/2020), Disp: 90 tablet, Rfl: 1   Review of Systems  Per HPI unless specifically indicated above     Objective:    BP 132/81   Pulse 80   Temp 97.9 F (36.6 C)   SpO2 97%   Wt Readings from Last 3 Encounters:  10/23/20 166 lb (75.3 kg)  10/05/20 168 lb (76.2 kg)  09/14/20 163 lb 9.6 oz (74.2 kg)    Physical Exam Vitals reviewed.  Constitutional:      General: He is not in acute distress.    Appearance: He is well-developed and well-nourished. He is not ill-appearing.  HENT:     Head: Normocephalic and atraumatic.  Cardiovascular:     Rate and Rhythm: Normal rate and regular rhythm.  Pulmonary:     Effort: Pulmonary effort is normal.     Breath sounds: Normal breath sounds. No wheezing.  Abdominal:     General: Bowel sounds are normal.     Palpations: Abdomen is soft. There is no hepatosplenomegaly.     Tenderness: There is no abdominal tenderness.  Musculoskeletal:        General: No  edema.     Cervical back: Neck supple.     Right lower leg: No edema.     Left lower leg: No edema.  Lymphadenopathy:     Cervical: No cervical adenopathy.  Skin:    General: Skin is warm and dry.  Neurological:     Mental Status: He is alert and oriented to person, place, and time.  Psychiatric:        Mood and Affect: Mood and affect normal.        Behavior: Behavior normal.             Assessment & Plan:    Encounter Diagnoses  Name Primary?  . Chronic obstructive pulmonary disease, unspecified COPD type (HCC) Yes  . Hyperlipidemia, unspecified hyperlipidemia type   . Marijuana use   . Right inguinal hernia   . Screening for colon cancer       -Pt to Get fasting labs drawn.  He will be called with results -Pt to call surgeon, He is given the office  phone number, to check on his hernia surgery -pt is given ifobt / hemocult FIT test for colon cancer screenings.  He has not returned this in the past and is encouraged to get this back to the office -pt encouraged to get back on meds when they arrive from medassist. -encouraged pt to get covid booster -pt to follow up 3 months.  He is to contact office sooner prn

## 2020-12-21 NOTE — Patient Instructions (Signed)
Dr Henreitta Leber Address: [address] [place], [place] [address] Phone: [phone]

## 2021-01-10 ENCOUNTER — Other Ambulatory Visit: Payer: Self-pay | Admitting: Physician Assistant

## 2021-01-10 MED ORDER — ALBUTEROL SULFATE HFA 108 (90 BASE) MCG/ACT IN AERS: 2.0000 | INHALATION_SPRAY | Freq: Four times a day (QID) | RESPIRATORY_TRACT | 0 refills | Status: DC | PRN

## 2021-01-10 MED ORDER — OMEPRAZOLE 20 MG PO TBEC: 20.0000 mg | DELAYED_RELEASE_TABLET | Freq: Every day | ORAL | 0 refills | Status: DC

## 2021-01-10 MED ORDER — ATORVASTATIN CALCIUM 40 MG PO TABS: 40.0000 mg | ORAL_TABLET | Freq: Every day | ORAL | 0 refills | Status: DC

## 2021-01-10 MED ORDER — FLUTICASONE-SALMETEROL 250-50 MCG/DOSE IN AEPB
1.0000 | INHALATION_SPRAY | Freq: Two times a day (BID) | RESPIRATORY_TRACT | 0 refills | Status: DC
Start: 2021-01-10 — End: 2021-07-05

## 2021-03-22 ENCOUNTER — Ambulatory Visit: Payer: Self-pay | Admitting: Physician Assistant

## 2021-03-22 ENCOUNTER — Other Ambulatory Visit: Payer: Self-pay

## 2021-03-22 ENCOUNTER — Encounter: Payer: Self-pay | Admitting: Physician Assistant

## 2021-03-22 VITALS — BP 136/78 | HR 83 | Temp 97.3°F

## 2021-03-22 DIAGNOSIS — J449 Chronic obstructive pulmonary disease, unspecified: Secondary | ICD-10-CM

## 2021-03-22 DIAGNOSIS — E785 Hyperlipidemia, unspecified: Secondary | ICD-10-CM

## 2021-03-22 DIAGNOSIS — F1290 Cannabis use, unspecified, uncomplicated: Secondary | ICD-10-CM

## 2021-03-22 DIAGNOSIS — R079 Chest pain, unspecified: Secondary | ICD-10-CM

## 2021-03-22 MED ORDER — NITROGLYCERIN 0.4 MG SL SUBL: 0.4000 mg | SUBLINGUAL_TABLET | SUBLINGUAL | 3 refills | Status: DC | PRN

## 2021-03-22 NOTE — Progress Notes (Signed)
BP 136/78   Pulse 83   Temp (!) 97.3 F (36.3 C)   SpO2 96%    Subjective:    Patient ID: Edgar Roberts, male    DOB: 1961/01/04, 60 y.o.   MRN: [id number]  HPI: Edgar Roberts is a 60 y.o. male presenting on 03/22/2021 for COPD and Hyperlipidemia   HPI   Pt had a negative covid 19 screening questionnaire.    Pt is 59yoM with routine appointment to follow up COPD and dyslipidemia.  Pt didn't get labs drawn  He was using NTG but he ran out.  He was using them maybe twice/week.  It made the pain go away.  He is having pain in the left arm.   He has appointment with cardiology in June.  He was last seen there in December.  He didn't know he could call the pharmacy for a refill.    Pt did not return FIT test given to him in February for colon cancer screening.   He no longer smokes cigarettes but smokes marijuana daily.      Relevant past medical, surgical, family and social history reviewed and updated as indicated. Interim medical history since our last visit reviewed. Allergies and medications reviewed and updated.   Current Outpatient Medications:  .  albuterol (VENTOLIN HFA) 108 (90 Base) MCG/ACT inhaler, Inhale 2 puffs into the lungs every 6 (six) hours as needed for wheezing or shortness of breath., Disp: 3 each, Rfl: 0 .  aspirin EC 81 MG tablet, Take 1 tablet (81 mg total) by mouth daily., Disp: 90 tablet, Rfl: 3 .  atorvastatin (LIPITOR) 40 MG tablet, Take 1 tablet (40 mg total) by mouth daily., Disp: 90 tablet, Rfl: 0 .  Fluticasone-Salmeterol (ADVAIR DISKUS) 250-50 MCG/DOSE AEPB, Inhale 1 puff into the lungs 2 (two) times daily., Disp: 60 each, Rfl: 0 .  Omeprazole 20 MG TBEC, Take 1 tablet (20 mg total) by mouth daily before breakfast., Disp: 90 tablet, Rfl: 0 .  nitroGLYCERIN (NITROSTAT) 0.4 MG SL tablet, Place 1 tablet (0.4 mg total) under the tongue every 5 (five) minutes as needed for chest pain. May repeat for total 3 doses.  If still having CP, go to ER  (Patient not taking: Reported on 03/22/2021), Disp: 25 tablet, Rfl: 3    Review of Systems  Per HPI unless specifically indicated above     Objective:    BP 136/78   Pulse 83   Temp (!) 97.3 F (36.3 C)   SpO2 96%   Wt Readings from Last 3 Encounters:  10/23/20 166 lb (75.3 kg)  10/05/20 168 lb (76.2 kg)  09/14/20 163 lb 9.6 oz (74.2 kg)    Physical Exam Vitals reviewed.  Constitutional:      General: He is not in acute distress.    Appearance: He is well-developed. He is not toxic-appearing.  HENT:     Head: Normocephalic and atraumatic.  Cardiovascular:     Rate and Rhythm: Normal rate and regular rhythm.  Pulmonary:     Effort: Pulmonary effort is normal.     Breath sounds: Normal breath sounds. No wheezing.  Abdominal:     General: Bowel sounds are normal.     Palpations: Abdomen is soft.     Tenderness: There is no abdominal tenderness.  Musculoskeletal:     Cervical back: Neck supple.     Right lower leg: No edema.     Left lower leg: No edema.  Lymphadenopathy:  Cervical: No cervical adenopathy.  Skin:    General: Skin is warm and dry.  Neurological:     Mental Status: He is alert and oriented to person, place, and time.  Psychiatric:        Behavior: Behavior normal.            Assessment & Plan:    Encounter Diagnoses  Name Primary?  . Chronic obstructive pulmonary disease, unspecified COPD type (HCC) Yes  . Hyperlipidemia, unspecified hyperlipidemia type   . Marijuana use   . Chest pain, unspecified type      -pt to Get labs drawn.  He will be called with results -Refill NTG sent to pharmacy -[facility] application given to pt for advair (since it is no longer available through [facility]).  He know that he needs to return it to the office -pt was encouraged to stop smoking -Pt was encouraged to return FIT test he was given in February -pt to follow up with cardiology in June as scheduled

## 2021-04-19 ENCOUNTER — Other Ambulatory Visit: Payer: Self-pay | Admitting: Physician Assistant

## 2021-04-19 DIAGNOSIS — E785 Hyperlipidemia, unspecified: Secondary | ICD-10-CM

## 2021-04-19 DIAGNOSIS — Z125 Encounter for screening for malignant neoplasm of prostate: Secondary | ICD-10-CM

## 2021-04-23 ENCOUNTER — Other Ambulatory Visit: Payer: Self-pay

## 2021-04-23 ENCOUNTER — Ambulatory Visit (INDEPENDENT_AMBULATORY_CARE_PROVIDER_SITE_OTHER): Payer: Self-pay | Admitting: Cardiology

## 2021-04-23 ENCOUNTER — Encounter: Payer: Self-pay | Admitting: Cardiology

## 2021-04-23 VITALS — BP 140/84 | HR 95 | Ht 72.0 in | Wt 152.6 lb

## 2021-04-23 DIAGNOSIS — R030 Elevated blood-pressure reading, without diagnosis of hypertension: Secondary | ICD-10-CM

## 2021-04-23 DIAGNOSIS — R0789 Other chest pain: Secondary | ICD-10-CM

## 2021-04-23 NOTE — Patient Instructions (Signed)
Medication Instructions:  Continue all current medications.  Labwork: none  Testing/Procedures: none  Follow-Up: Your physician wants you to follow up in:  1 year.  You will receive a reminder letter in the mail one-two months in advance.  If you don't receive a letter, please call our office to schedule the follow up appointment.    Any Other Special Instructions Will Be Listed Below (If Applicable). DASH diet info provided today.   If you need a refill on your cardiac medications before your next appointment, please call your pharmacy.

## 2021-04-23 NOTE — Progress Notes (Signed)
Clinical Summary Edgar Roberts is a 60 y.o.male seen today for follow up of the following medical problems.     1. Chest pain - started 3-4 months ago. "Funny feeling" left chest and radiates into arm and hand. Tends to occur with activity. Can cause some diaphoresis and nausea. + SOB. Can be worst with deep breaths. No relation to food. Lasts about 10-20 minutes. Occurs 3 times a week. - notes some generalized fatigue. Notes some DOE with walking uphill - no LE edema, no orthopna   CAD risk factors: tobacco, sister with "open heart surgeries" and stents.     Jan 2021 nuclear stress: inferior/inferoseptal infarct, no current ischemia. Low risk study  - ongoing left arm tingling feeling. Lasts for several hours. Can have left handed weakness.      2. ASCVD risk - ASCVD risk score is 7%, qualifying as borderline risk.  - nuclear stress test would suggest prior infarct  - compliant with statin - labs followed by pcp    SH 2 kids 10 and 15. 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 3 each 0   aspirin EC 81 MG tablet Take 1 tablet (81 mg total) by mouth daily. 90 tablet 3   atorvastatin (LIPITOR) 40 MG tablet Take 1 tablet (40 mg total) by mouth daily. 90 tablet 0   Fluticasone-Salmeterol (ADVAIR DISKUS) 250-50 MCG/DOSE AEPB Inhale 1 puff into the lungs 2 (two) times daily. 60 each 0   nitroGLYCERIN (NITROSTAT) 0.4 MG SL tablet Place 1 tablet (0.4 mg total) under the tongue every 5 (five) minutes as needed for chest pain. May repeat for total 3 doses.  If still  having CP, go to ER 25 tablet 3   Omeprazole 20 MG TBEC Take 1 tablet (20 mg total) by mouth daily before breakfast. 90 tablet 0   No current facility-administered medications for this visit.     Past Surgical History:  Procedure Laterality Date   BIOPSY N/A 09/04/2015   Procedure: BIOPSY;  Surgeon: Corbin Ade, MD;  Location: [facility];  Service: Endoscopy;  Laterality: N/A;  gastric   ESOPHAGEAL DILATION N/A 09/04/2015   Procedure:  ESOPHAGEAL DILATION;  Surgeon: Corbin Ade, MD;  Location: [facility];  Service: Endoscopy;  Laterality: N/A;  maloney 56   ESOPHAGOGASTRODUODENOSCOPY (EGD) WITH PROPOFOL N/A 09/04/2015   Dr. Jena Gauss: mild erosive reflux esophagitis. H.pylori gastritis   none       No Known Allergies    Family History  Problem Relation Age of Onset   Cirrhosis Father        deceased   Heart disease Sister        3 open heart surgeries   Heart disease Mother    Heart disease Other        grandmother   Colon cancer Neg Hx      Social History Edgar Roberts reports that he quit smoking about 38 years ago. His smoking use included cigarettes. He started smoking about 44 years ago. He  has a 10.00 pack-year smoking history. He has never used smokeless tobacco. Edgar Roberts reports no history of alcohol use.   Review of Systems CONSTITUTIONAL: No weight loss, fever, chills, weakness or fatigue.  HEENT: Eyes: No visual loss, blurred vision, double vision or yellow sclerae.No hearing loss, sneezing, congestion, runny nose or sore throat.  SKIN: No rash or itching.  CARDIOVASCULAR: per hpi RESPIRATORY: No shortness of breath, cough or sputum.  GASTROINTESTINAL: No anorexia, nausea, vomiting or diarrhea. No abdominal pain or blood.  GENITOURINARY: No burning on urination, no polyuria NEUROLOGICAL: No headache, dizziness, syncope, paralysis, ataxia, numbness or tingling in the extremities. No change in bowel or bladder control.  MUSCULOSKELETAL: No muscle, back pain, joint  pain or stiffness.  LYMPHATICS: No enlarged nodes. No history of splenectomy.  PSYCHIATRIC: No history of depression or anxiety.  ENDOCRINOLOGIC: No reports of sweating, cold or heat intolerance. No polyuria or polydipsia.  Marland Kitchen   Physical Examination Today's Vitals   04/23/21 0932  BP: 140/84  Pulse: 95  SpO2: 98%  Weight: 152 lb 9.6 oz (69.2 kg)  Height: 6' (1.829 m)   Body mass index is 20.7 kg/m.  Gen: resting comfortably, no acute distress HEENT: no scleral icterus, pupils equal round and reactive, no palptable cervical adenopathy,  CV: RRR, no m/r/g, no jvd Resp: Clear to auscultation bilaterally GI: abdomen is soft, non-tender, non-distended, normal bowel sounds, no hepatosplenomegaly MSK: extremities are warm, no edema.  Skin: warm, no rash Neuro:  no focal deficits Psych: appropriate affect   Diagnostic Studies  06/2015 GXT There was no ST segment deviation noted during stress.   Clinically and electrically negative for ischemia.  Very good exercise tolerance     Jan 2021 nuclear stress test There was no ST segment deviation noted during stress. 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 This is a low risk study. There is no current myocardium at jeopardy The left ventricular ejection fraction is low normal (50%).     Assessment and Plan  1. Chest pain - nuclear stress without current ischemia, possible old inferior infarct - no recent chest pains, ongoing left arm tingling/numbness that can last for hours at a time suggesting neurologic etiology, perhaps cervical radiculopathy. Would defer to pcp to consider any form of imaging  2. Borderline HTN - manual bp 134/74, slightly above goal - discussed dietary changes including DASH diet, monitor at this time. If progresses may need to consider medical therapy.       Antoine Poche, M.D.

## 2021-07-05 ENCOUNTER — Other Ambulatory Visit (HOSPITAL_COMMUNITY)
Admission: RE | Admit: 2021-07-05 | Discharge: 2021-07-05 | Disposition: A | Discharge status: Home / Self Care | Payer: Self-pay | Source: Ambulatory Visit | Attending: Physician Assistant | Admitting: Physician Assistant

## 2021-07-05 ENCOUNTER — Ambulatory Visit: Payer: Self-pay | Admitting: Physician Assistant

## 2021-07-05 ENCOUNTER — Other Ambulatory Visit: Payer: Self-pay

## 2021-07-05 ENCOUNTER — Encounter: Payer: Self-pay | Admitting: Physician Assistant

## 2021-07-05 VITALS — BP 121/71 | HR 61 | Temp 98.0°F | Wt 145.0 lb

## 2021-07-05 DIAGNOSIS — J449 Chronic obstructive pulmonary disease, unspecified: Secondary | ICD-10-CM

## 2021-07-05 DIAGNOSIS — F1290 Cannabis use, unspecified, uncomplicated: Secondary | ICD-10-CM

## 2021-07-05 DIAGNOSIS — F424 Excoriation (skin-picking) disorder: Secondary | ICD-10-CM

## 2021-07-05 DIAGNOSIS — Z125 Encounter for screening for malignant neoplasm of prostate: Secondary | ICD-10-CM | POA: Insufficient documentation

## 2021-07-05 DIAGNOSIS — E785 Hyperlipidemia, unspecified: Secondary | ICD-10-CM | POA: Insufficient documentation

## 2021-07-05 LAB — COMPREHENSIVE METABOLIC PANEL
ALT: 12 U/L (ref 0–44)
AST: 21 U/L (ref 15–41)
Albumin: 4.4 g/dL (ref 3.5–5.0)
Alkaline Phosphatase: 90 U/L (ref 38–126)
Anion gap: 8 (ref 5–15)
BUN: 8 mg/dL (ref 6–20)
CO2: 28 mmol/L (ref 22–32)
Calcium: 9.1 mg/dL (ref 8.9–10.3)
Chloride: 101 mmol/L (ref 98–111)
Creatinine, Ser: 0.82 mg/dL (ref 0.61–1.24)
GFR, Estimated: >60 mL/min (ref >60)
Glucose, Bld: 105 mg/dL — ABNORMAL HIGH (ref 70–99)
Potassium: 4 mmol/L (ref 3.5–5.1)
Sodium: 137 mmol/L (ref 135–145)
Total Bilirubin: 0.9 mg/dL (ref 0.3–1.2)
Total Protein: 7.8 g/dL (ref 6.5–8.1)

## 2021-07-05 LAB — LIPID PANEL
Cholesterol: 141 mg/dL (ref 0–200)
HDL: 28 mg/dL — ABNORMAL LOW (ref >40)
LDL Cholesterol: 102 mg/dL — ABNORMAL HIGH (ref 0–99)
Total CHOL/HDL Ratio: 5 RATIO
Triglycerides: 55 mg/dL (ref <150)
VLDL: 11 mg/dL (ref 0–40)

## 2021-07-05 LAB — PSA: Prostatic Specific Antigen: 0.59 ng/mL (ref 0.00–4.00)

## 2021-07-05 MED ORDER — CLOTRIMAZOLE-BETAMETHASONE 1-0.05 % EX CREA: 1.0000 "application " | TOPICAL_CREAM | Freq: Two times a day (BID) | CUTANEOUS | 1 refills | Status: DC

## 2021-07-05 MED ORDER — SULFAMETHOXAZOLE-TRIMETHOPRIM 800-160 MG PO TABS: 1.0000 | ORAL_TABLET | Freq: Two times a day (BID) | ORAL | 0 refills | Status: AC

## 2021-07-05 MED ORDER — FLUTICASONE-SALMETEROL 250-50 MCG/ACT IN AEPB: 1.0000 | INHALATION_SPRAY | Freq: Two times a day (BID) | RESPIRATORY_TRACT | 3 refills | Status: DC

## 2021-07-05 NOTE — Progress Notes (Signed)
BP 121/71   Pulse 61   Temp 98 F (36.7 C)   Wt 145 lb (65.8 kg)   SpO2 97%   BMI 19.67 kg/m    Subjective:    Patient ID: Edgar Roberts, male    DOB: February 17, 1961, 60 y.o.   MRN: [id number]  HPI: Edgar Roberts is a 60 y.o. male presenting on 07/05/2021 for Hyperlipidemia and COPD   HPI   Pt had a negative covid 19 screening questionnaire.  Chief Complaint  Patient presents with   Hyperlipidemia   COPD    Pt is Still smoking MJ  He still has FIT test for colon cancer screening at home.  He just this morning went for his labs draw; he was 20 minutes late for his appointment as a result.  He says he is doing well and has no complaints.    Relevant past medical, surgical, family and social history reviewed and updated as indicated. Interim medical history since our last visit reviewed. Allergies and medications reviewed and updated.   Current Outpatient Medications:    aspirin EC 81 MG tablet, Take 1 tablet (81 mg total) by mouth daily., Disp: 90 tablet, Rfl: 3   atorvastatin (LIPITOR) 40 MG tablet, Take 1 tablet (40 mg total) by mouth daily., Disp: 90 tablet, Rfl: 0   omeprazole (PRILOSEC) 20 MG capsule, Take 20 mg by mouth every morning., Disp: , Rfl:    Omeprazole 20 MG TBEC, Take 1 tablet (20 mg total) by mouth daily before breakfast., Disp: 90 tablet, Rfl: 0   albuterol (VENTOLIN HFA) 108 (90 Base) MCG/ACT inhaler, Inhale 2 puffs into the lungs every 6 (six) hours as needed for wheezing or shortness of breath. (Patient not taking: Reported on 07/05/2021), Disp: 3 each, Rfl: 0   Fluticasone-Salmeterol (ADVAIR DISKUS) 250-50 MCG/DOSE AEPB, Inhale 1 puff into the lungs 2 (two) times daily. (Patient not taking: Reported on 07/05/2021), Disp: 60 each, Rfl: 0   nitroGLYCERIN (NITROSTAT) 0.4 MG SL tablet, Place 1 tablet (0.4 mg total) under the tongue every 5 (five) minutes as needed for chest pain. May repeat for total 3 doses.  If still having CP, go to ER (Patient not  taking: Reported on 07/05/2021), Disp: 25 tablet, Rfl: 3     Review of Systems  Per HPI unless specifically indicated above     Objective:    BP 121/71   Pulse 61   Temp 98 F (36.7 C)   Wt 145 lb (65.8 kg)   SpO2 97%   BMI 19.67 kg/m   Wt Readings from Last 3 Encounters:  07/05/21 145 lb (65.8 kg)  04/23/21 152 lb 9.6 oz (69.2 kg)  10/23/20 166 lb (75.3 kg)    Physical Exam Vitals reviewed.  Constitutional:      General: He is not in acute distress.    Appearance: He is well-developed. He is not toxic-appearing.  HENT:     Head: Normocephalic and atraumatic.  Cardiovascular:     Rate and Rhythm: Normal rate and regular rhythm.  Pulmonary:     Effort: Pulmonary effort is normal.     Breath sounds: Normal breath sounds. No wheezing.  Abdominal:     General: Bowel sounds are normal.     Palpations: Abdomen is soft.     Tenderness: There is no abdominal tenderness.  Musculoskeletal:     Cervical back: Neck supple.     Right lower leg: No edema.     Left lower leg: No edema.  Lymphadenopathy:     Cervical: No cervical adenopathy.  Skin:    General: Skin is warm and dry.     Comments: Multiple excoriated areas on skin, mostly arms, that are inflamed but no abscess or cellulitis  Neurological:     Mental Status: He is alert and oriented to person, place, and time.  Psychiatric:        Behavior: Behavior normal.        Assessment & Plan:    Encounter Diagnoses  Name Primary?   Chronic obstructive pulmonary disease, unspecified COPD type (HCC) Yes   Hyperlipidemia, unspecified hyperlipidemia type    Marijuana use    Picking own skin       -Will call pt with lab results -Filled out PAP for inhalers -Gave sample to pt to use until he gets his rx -Urged pt to stop picking his skin -he is encouraged to return his FIT test for colon cancer screening -pt to follow up 6 months.  He is to contact office sooner prn

## 2021-12-17 ENCOUNTER — Telehealth: Payer: Self-pay | Admitting: Physician Assistant

## 2021-12-17 ENCOUNTER — Other Ambulatory Visit: Payer: Self-pay | Admitting: Physician Assistant

## 2021-12-17 DIAGNOSIS — E785 Hyperlipidemia, unspecified: Secondary | ICD-10-CM

## 2021-12-17 NOTE — Telephone Encounter (Signed)
Pt was called and notified that he needs to complete enrollment through [facility] to update eligibility for services at [facility].  He states understanding and says he will call right away.

## 2022-01-02 ENCOUNTER — Ambulatory Visit: Payer: Self-pay | Admitting: Physician Assistant

## 2022-01-09 ENCOUNTER — Encounter: Payer: Self-pay | Admitting: Physician Assistant

## 2022-01-09 ENCOUNTER — Ambulatory Visit: Payer: Self-pay | Admitting: Physician Assistant

## 2022-01-09 VITALS — BP 111/65 | HR 85 | Temp 98.3°F | Wt 136.0 lb

## 2022-01-09 DIAGNOSIS — J449 Chronic obstructive pulmonary disease, unspecified: Secondary | ICD-10-CM

## 2022-01-09 DIAGNOSIS — Z87891 Personal history of nicotine dependence: Secondary | ICD-10-CM

## 2022-01-09 DIAGNOSIS — R634 Abnormal weight loss: Secondary | ICD-10-CM

## 2022-01-09 DIAGNOSIS — K4090 Unilateral inguinal hernia, without obstruction or gangrene, not specified as recurrent: Secondary | ICD-10-CM

## 2022-01-09 DIAGNOSIS — F424 Excoriation (skin-picking) disorder: Secondary | ICD-10-CM

## 2022-01-09 DIAGNOSIS — E785 Hyperlipidemia, unspecified: Secondary | ICD-10-CM

## 2022-01-09 DIAGNOSIS — F1290 Cannabis use, unspecified, uncomplicated: Secondary | ICD-10-CM

## 2022-01-09 MED ORDER — FLUTICASONE-SALMETEROL 100-50 MCG/ACT IN AEPB: 1.0000 | INHALATION_SPRAY | Freq: Two times a day (BID) | RESPIRATORY_TRACT | 0 refills | Status: DC

## 2022-01-09 MED ORDER — ATORVASTATIN CALCIUM 40 MG PO TABS: 40.0000 mg | ORAL_TABLET | Freq: Every day | ORAL | 0 refills | Status: DC

## 2022-01-09 MED ORDER — CLOTRIMAZOLE-BETAMETHASONE 1-0.05 % EX CREA: 1.0000 "application " | TOPICAL_CREAM | Freq: Two times a day (BID) | CUTANEOUS | 1 refills | Status: DC

## 2022-01-09 MED ORDER — ALBUTEROL SULFATE HFA 108 (90 BASE) MCG/ACT IN AERS: 2.0000 | INHALATION_SPRAY | Freq: Four times a day (QID) | RESPIRATORY_TRACT | 0 refills | Status: DC | PRN

## 2022-01-09 MED ORDER — OMEPRAZOLE 20 MG PO TBEC: 20.0000 mg | DELAYED_RELEASE_TABLET | Freq: Every day | ORAL | 0 refills | Status: DC
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
## 2022-01-10 ENCOUNTER — Telehealth: Payer: Self-pay

## 2022-01-15 ENCOUNTER — Ambulatory Visit: Payer: Self-pay | Admitting: General Surgery

## 2022-01-17 ENCOUNTER — Ambulatory Visit: Payer: Self-pay | Admitting: General Surgery

## 2022-01-17 ENCOUNTER — Telehealth: Payer: Self-pay

## 2022-01-17 ENCOUNTER — Encounter: Payer: Self-pay | Admitting: General Surgery

## 2022-01-17 ENCOUNTER — Other Ambulatory Visit: Payer: Self-pay

## 2022-01-17 VITALS — BP 144/81 | HR 73 | Temp 98.1°F | Resp 16 | Ht 72.0 in | Wt 134.0 lb

## 2022-01-17 DIAGNOSIS — K4090 Unilateral inguinal hernia, without obstruction or gangrene, not specified as recurrent: Secondary | ICD-10-CM

## 2022-01-17 NOTE — Progress Notes (Signed)
[facility] History and Physical ? ? ? ?Edgar Roberts is a 61 y.o. male.  ?HPI: Mr. Edgar Roberts was seen by me in 2021 for a right inguinal hernia. He needed cardiology follow up and had that and was considered low risk of surgery. He unfortunately had some personal issues over the last year and was unable to get it repaired. He has a left and right inguinal hernia and both are worse. He says that he is otherwise been ok but did lost 20 lbs over the last year. It sounds like he had a very stress home life in the last year but is in a better place now. PCP is ordering a CT chest and a FIT stool sample to ensure no other reasons for the weight loss. He denies any CP. He has some chronic SOB due to the COPD.  ? ?Past Medical History:  ?Diagnosis Date  ? Asthma   ? Chronic hepatitis C ([place])   ? COPD (chronic obstructive pulmonary disease) ([place])   ? GERD (gastroesophageal reflux disease)   ? ? ?Past Surgical History:  ?Procedure Laterality Date  ? BIOPSY N/A 09/04/2015  ? Procedure: BIOPSY;  Surgeon: Daneil Dolin, MD;  Location: [facility];  Service: Endoscopy;  Laterality: N/A;  gastric  ? ESOPHAGEAL DILATION N/A 09/04/2015  ? Procedure:  ESOPHAGEAL DILATION;  Surgeon: Daneil Dolin, MD;  Location: [facility];  Service: Endoscopy;  Laterality: N/A;  maloney 56  ? ESOPHAGOGASTRODUODENOSCOPY (EGD) WITH PROPOFOL N/A 09/04/2015  ? Dr. Gala Romney: mild erosive reflux esophagitis. H.pylori gastritis  ? none    ? ? ?Family History  ?Problem Relation Age of Onset  ? Cirrhosis Father   ?     deceased  ? Heart disease Sister   ?     3 open heart surgeries  ? Heart disease Mother   ? Heart disease Other   ?     grandmother  ? Colon cancer Neg Hx   ? ? ?Social History  ? ?Tobacco Use  ? Smoking status: Former  ?  Packs/day: 2.00  ?  Years: 5.00  ?  Pack years: 10.00  ?  Types: Cigarettes  ?  Start date: 06/06/1976  ?  Quit date: 10/28/1982  ?  Years since quitting: 39.2  ? Smokeless tobacco: Never  ? Tobacco comments:  ?  only  smokes marijuana  ?Vaping Use  ? Vaping Use: Never used  ?Substance Use Topics  ? Alcohol use: No  ?  Alcohol/week: 0.0 standard drinks  ?  Comment: quit 01/2014  ? Drug use: Yes  ?  Types: Marijuana  ?  Comment: everyday  ? ? ?Medications: I have reviewed the patient's current medications. ?Allergies as of 01/17/2022   ?No Known Allergies ?  ? ?  ?Medication List  ?  ? ?  ? Accurate as of January 17, 2022  1:15 PM. If you have any questions, ask your nurse or doctor.  ?  ?  ? ?  ? ?albuterol 108 (90 Base) MCG/ACT inhaler ?Commonly known as: VENTOLIN HFA ?Inhale 2 puffs into the lungs every 6 (six) hours as needed for wheezing or shortness of breath. ?  ?aspirin EC 81 MG tablet ?Take 1 tablet (81 mg total) by mouth daily. ?  ?atorvastatin 40 MG tablet ?Commonly known as: LIPITOR ?Take 1 tablet (40 mg total) by mouth daily. ?  ?clotrimazole-betamethasone cream ?Commonly known as: Lotrisone ?Apply 1 application. topically 2 (two) times daily. ?  ?fluticasone-salmeterol 100-50 MCG/ACT  Aepb ?Commonly known as: Wixela Inhub ?Inhale 1 puff into the lungs 2 (two) times daily. ?  ?nitroGLYCERIN 0.4 MG SL tablet ?Commonly known as: NITROSTAT ?Place 1 tablet (0.4 mg total) under the tongue every 5 (five) minutes as needed for chest pain. May repeat for total 3 doses.  If still having CP, go to ER ?  ?Omeprazole 20 MG Tbec ?Take 1 tablet (20 mg total) by mouth daily before breakfast. ?  ? ?  ? ? ? ?ROS:  ?A comprehensive review of systems was negative except for: Gastrointestinal: positive for abdominal pain and bilateral inguinal hernia  ? ?Blood pressure (!) 144/81, pulse 73, temperature 98.1 ?F (36.7 ?C), temperature source Other (Comment), resp. rate 16, height 6' (1.829 m), weight 134 lb (60.8 kg), SpO2 97 %. ?Physical Exam ?Vitals reviewed.  ?Constitutional:   ?   Appearance: He is underweight.  ?HENT:  ?   Head: Normocephalic.  ?Eyes:  ?   Extraocular Movements: Extraocular movements intact.  ?Cardiovascular:  ?   Rate and  Rhythm: Normal rate.  ?Pulmonary:  ?   Effort: Pulmonary effort is normal.  ?   Breath sounds: Normal breath sounds.  ?Abdominal:  ?   General: There is no distension.  ?   Palpations: Abdomen is soft. There is mass.  ?   Tenderness: There is no abdominal tenderness.  ?   Hernia: A hernia is present. There is no hernia in the left inguinal area or right inguinal area.  ?   Comments: Reducible hernias   ?Musculoskeletal:     ?   General: Normal range of motion.  ?Skin: ?   General: Skin is warm.  ?Neurological:  ?   General: No focal deficit present.  ?   Mental Status: He is alert and oriented to person, place, and time.  ?Psychiatric:     ?   Mood and Affect: Mood normal.     ?   Behavior: Behavior normal.  ? ? ?Results: ?None ? ?Assessment & Plan:  ?Edgar Roberts is a 61 y.o. male with bilateral inguinal hernia with the right being worse than the left.  ?-Discussed the risk and benefits including, bleeding, infection, use of mesh, risk of recurrence, risk of nerve damage causing numbness or changes in sensation, risk of damage to the cord structures. The patient understands the risk and benefits of repair with mesh, and has decided to proceed.  We also discussed open versus laparoscopic surgery and the use of mesh. We discussed that I do open repairs with mesh, and that this is considered equivalent to laparoscopic surgery. We discussed reasons for opting for laparoscopic surgery including if a bilateral repair is needed or if a patient has a recurrence after an open repair. ? ?He does not want to got to [facility] for a bilateral repair. He wants to start with a right inguinal repair. ? ?FIT and CT chest will be done by 4/17. Will plan for surgery after that day. ? ?All questions were answered to the satisfaction of the patient. ? ? ?Virl Cagey ?01/17/2022, 1:15 PM  ? ? ? ? ? ?

## 2022-01-17 NOTE — Patient Instructions (Addendum)
GET CT chest and do the Stool study to ensure we are not missing anything like cancer before the hernia repair. We may have to postpone it in that case.  ? ?Open Hernia Repair, Adult ?Open hernia repair is a surgical procedure to fix a hernia. A hernia occurs when an internal organ or tissue pushes through a weak spot in the muscles along the wall of the abdomen. Hernias commonly occur in the groin and around the belly button. ?Most hernias tend to get worse over time. Often, surgery is done to prevent the hernia from becoming bigger, uncomfortable, or an emergency. Emergency surgery may be needed if contents of the abdomen get stuck in the opening (incarcerated hernia) or if the blood supply gets cut off (strangulated hernia). In an open repair, an incision is made in the abdomen to perform the surgery. ?Tell a health care provider about: ?Any allergies you have. ?All medicines you are taking, including vitamins, herbs, eye drops, creams, and over-the-counter medicines. ?Any problems you or family members have had with anesthetic medicines. ?Any blood or bone disorders you have. ?Any surgeries you have had. ?Any medical conditions you have, including any recent cold or flu (influenza)symptoms. ?Whether you are pregnant or may be pregnant. ?What are the risks? ?Generally, this is a safe procedure. However, problems may occur, including: ?Long-lasting (chronic) pain. ?Bleeding. ?Infection. ?Damage to the testicles. This can cause shrinking or swelling. ?Damage to nearby structures or organs, including the bladder, blood vessels, intestines, or nerves near the hernia. ?Blood clots. ?Trouble passing urine. ?Return of the hernia. ?What happens before the procedure? ?Medicines ?Ask your health care provider about: ?Changing or stopping your regular medicines. This is especially important if you are taking diabetes medicines or blood thinners. ?Taking medicines such as aspirin and ibuprofen. These medicines can thin your  blood. Do not take these medicines unless your health care provider tells you to take them. ?Taking over-the-counter medicines, vitamins, herbs, and supplements. ?Surgery safety ?Ask your health care provider: ?How your surgery site will be marked. ?What steps will be taken to help prevent infection. These steps may include: ?Removing hair at the surgery site. ?Washing skin with a germ-killing soap. ?Receiving antibiotic medicine. ?General instructions ?You may have an exam or testing, such as blood tests or imaging studies. ?Do not use any products that contain nicotine or tobacco for at least 4 weeks before the procedure. These products include cigarettes, chewing tobacco, and vaping devices, such as e-cigarettes. If you need help quitting, ask your health care provider. ?Let your health care provider know if you develop a cold or any infection before your surgery. If you get an infection before surgery, you may receive antibiotics to treat it. ?Plan to have a responsible adult take you home from the hospital or clinic. ?If you will be going home right after the procedure, plan to have a responsible adult care for you for the time you are told. This is important. ?What happens during the procedure? ? ?An IV will be inserted into one of your veins. ?You will be given one or more of the following: ?A medicine to help you relax (sedative). ?A medicine to numb the area (local anesthetic). ?A medicine to make you fall asleep (general anesthetic). ?Your surgeon will make an incision over the hernia. ?The tissues of the hernia will be moved back into place. ?The edges of the hernia may be stitched (sutured) together. ?The opening in the abdominal muscles will be closed with stitches (sutures). Or,  your surgeon will place a mesh patch made of artificial (synthetic) material over the opening. ?The incision will be closed with sutures, skin glue, or adhesive strips. ?A bandage (dressing) may be placed over the  incision. ?The procedure may vary among health care providers and hospitals. ?What happens after the procedure? ?Your blood pressure, heart rate, breathing rate, and blood oxygen level will be monitored until you leave the hospital or clinic. ?You may be given medicine for pain. ?If you were given a sedative during the procedure, it can affect you for several hours. Do not drive or operate machinery until your health care provider says that it is safe. ?Summary ?Open hernia repair is a surgical procedure to fix a hernia. Hernias commonly occur in the groin and around the belly button. ?Emergency surgery may be needed if contents of the abdomen get stuck in the opening (incarcerated hernia) or if the blood supply gets cut off (strangulated hernia). ?In this procedure, an incision is made in the abdomen to perform the surgery. ?After the procedure, you may be given medicine for pain. ?This information is not intended to replace advice given to you by your health care provider. Make sure you discuss any questions you have with your health care provider. ?Document Revised: 05/29/2020 Document Reviewed: 05/29/2020 ?Elsevier Patient Education ? 2022 [employer] ? ?

## 2022-01-17 NOTE — Telephone Encounter (Signed)
Called pt to remind of labs needing to be done before CT & advised they are fasting labs, pt stated he will go first thing in the morning as he has already had breakfast & coffee this morning. ?

## 2022-01-17 NOTE — H&P (Signed)
[facility] History and Physical ? ? ? ?Edgar Roberts is a 60 y.o. male.  ?HPI: Edgar Roberts was seen by me in 2021 for a right inguinal hernia. He needed cardiology follow up and had that and was considered low risk of surgery. He unfortunately had some personal issues over the last year and was unable to get it repaired. He has a left and right inguinal hernia and both are worse. He says that he is otherwise been ok but did lost 20 lbs over the last year. It sounds like he had a very stress home life in the last year but is in a better place now. PCP is ordering a CT chest and a FIT stool sample to ensure no other reasons for the weight loss. He denies any CP. He has some chronic SOB due to the COPD.  ? ?Past Medical History:  ?Diagnosis Date  ? Asthma   ? Chronic hepatitis C ([facility])   ? COPD (chronic obstructive pulmonary disease) ([facility])   ? GERD (gastroesophageal reflux disease)   ? ? ?Past Surgical History:  ?Procedure Laterality Date  ? BIOPSY N/A 09/04/2015  ? Procedure: BIOPSY;  Surgeon: Robert M Rourk, MD;  Location: [facility];  Service: Endoscopy;  Laterality: N/A;  gastric  ? ESOPHAGEAL DILATION N/A 09/04/2015  ? Procedure:  ESOPHAGEAL DILATION;  Surgeon: Robert M Rourk, MD;  Location: [facility];  Service: Endoscopy;  Laterality: N/A;  maloney 56  ? ESOPHAGOGASTRODUODENOSCOPY (EGD) WITH PROPOFOL N/A 09/04/2015  ? Dr. Rourk: mild erosive reflux esophagitis. H.pylori gastritis  ? none    ? ? ?Family History  ?Problem Relation Age of Onset  ? Cirrhosis Father   ?     deceased  ? Heart disease Sister   ?     3 open heart surgeries  ? Heart disease Mother   ? Heart disease Other   ?     grandmother  ? Colon cancer Neg Hx   ? ? ?Social History  ? ?Tobacco Use  ? Smoking status: Former  ?  Packs/day: 2.00  ?  Years: 5.00  ?  Pack years: 10.00  ?  Types: Cigarettes  ?  Start date: 06/06/1976  ?  Quit date: 10/28/1982  ?  Years since quitting: 39.2  ? Smokeless tobacco: Never  ? Tobacco comments:  ?  only  smokes marijuana  ?Vaping Use  ? Vaping Use: Never used  ?Substance Use Topics  ? Alcohol use: No  ?  Alcohol/week: 0.0 standard drinks  ?  Comment: quit 01/2014  ? Drug use: Yes  ?  Types: Marijuana  ?  Comment: everyday  ? ? ?Medications: I have reviewed the patient's current medications. ?Allergies as of 01/17/2022   ?No Known Allergies ?  ? ?  ?Medication List  ?  ? ?  ? Accurate as of January 17, 2022  1:15 PM. If you have any questions, ask your nurse or doctor.  ?  ?  ? ?  ? ?albuterol 108 (90 Base) MCG/ACT inhaler ?Commonly known as: VENTOLIN HFA ?Inhale 2 puffs into the lungs every 6 (six) hours as needed for wheezing or shortness of breath. ?  ?aspirin EC 81 MG tablet ?Take 1 tablet (81 mg total) by mouth daily. ?  ?atorvastatin 40 MG tablet ?Commonly known as: LIPITOR ?Take 1 tablet (40 mg total) by mouth daily. ?  ?clotrimazole-betamethasone cream ?Commonly known as: Lotrisone ?Apply 1 application. topically 2 (two) times daily. ?  ?fluticasone-salmeterol 100-50 MCG/ACT   Aepb ?Commonly known as: Wixela Inhub ?Inhale 1 puff into the lungs 2 (two) times daily. ?  ?nitroGLYCERIN 0.4 MG SL tablet ?Commonly known as: NITROSTAT ?Place 1 tablet (0.4 mg total) under the tongue every 5 (five) minutes as needed for chest pain. May repeat for total 3 doses.  If still having CP, go to ER ?  ?Omeprazole 20 MG Tbec ?Take 1 tablet (20 mg total) by mouth daily before breakfast. ?  ? ?  ? ? ? ?ROS:  ?A comprehensive review of systems was negative except for: Gastrointestinal: positive for abdominal pain and bilateral inguinal hernia  ? ?Blood pressure (!) 144/81, pulse 73, temperature 98.1 ?F (36.7 ?C), temperature source Other (Comment), resp. rate 16, height 6' (1.829 m), weight 134 lb (60.8 kg), SpO2 97 %. ?Physical Exam ?Vitals reviewed.  ?Constitutional:   ?   Appearance: He is underweight.  ?HENT:  ?   Head: Normocephalic.  ?Eyes:  ?   Extraocular Movements: Extraocular movements intact.  ?Cardiovascular:  ?   Rate and  Rhythm: Normal rate.  ?Pulmonary:  ?   Effort: Pulmonary effort is normal.  ?   Breath sounds: Normal breath sounds.  ?Abdominal:  ?   General: There is no distension.  ?   Palpations: Abdomen is soft. There is mass.  ?   Tenderness: There is no abdominal tenderness.  ?   Hernia: A hernia is present. There is no hernia in the left inguinal area or right inguinal area.  ?   Comments: Reducible hernias   ?Musculoskeletal:     ?   General: Normal range of motion.  ?Skin: ?   General: Skin is warm.  ?Neurological:  ?   General: No focal deficit present.  ?   Mental Status: He is alert and oriented to person, place, and time.  ?Psychiatric:     ?   Mood and Affect: Mood normal.     ?   Behavior: Behavior normal.  ? ? ?Results: ?None ? ?Assessment & Plan:  ?Edgar Roberts is a 60 y.o. male with bilateral inguinal hernia with the right being worse than the left.  ?-Discussed the risk and benefits including, bleeding, infection, use of mesh, risk of recurrence, risk of nerve damage causing numbness or changes in sensation, risk of damage to the cord structures. The patient understands the risk and benefits of repair with mesh, and has decided to proceed.  We also discussed open versus laparoscopic surgery and the use of mesh. We discussed that I do open repairs with mesh, and that this is considered equivalent to laparoscopic surgery. We discussed reasons for opting for laparoscopic surgery including if a bilateral repair is needed or if a patient has a recurrence after an open repair. ? ?He does not want to got to [place] for a bilateral repair. He wants to start with a right inguinal repair. ? ?FIT and CT chest will be done by 4/17. Will plan for surgery after that day. ? ?All questions were answered to the satisfaction of the patient. ? ? ?Edgar Roberts C Justene Jensen ?01/17/2022, 1:15 PM  ? ? ? ? ? ?  will be done by 4/17. Will plan for surgery after that day. ?  ?All questions were answered to the satisfaction of the patient. ?  ?  ?Lucretia Roers ?01/17/2022, 1:15 PM  ?  ?  ?  ?  ?  ?

## 2022-01-18 ENCOUNTER — Other Ambulatory Visit (HOSPITAL_COMMUNITY)
Admission: RE | Admit: 2022-01-18 | Discharge: 2022-01-18 | Disposition: A | Discharge status: Home / Self Care | Payer: Self-pay | Source: Ambulatory Visit | Attending: Physician Assistant | Admitting: Physician Assistant

## 2022-01-18 DIAGNOSIS — R634 Abnormal weight loss: Secondary | ICD-10-CM | POA: Insufficient documentation

## 2022-01-18 DIAGNOSIS — E785 Hyperlipidemia, unspecified: Secondary | ICD-10-CM | POA: Insufficient documentation

## 2022-01-18 LAB — COMPREHENSIVE METABOLIC PANEL
ALT: 13 U/L (ref 0–44)
AST: 17 U/L (ref 15–41)
Albumin: 4 g/dL (ref 3.5–5.0)
Alkaline Phosphatase: 65 U/L (ref 38–126)
Anion gap: 9 (ref 5–15)
BUN: 9 mg/dL (ref 6–20)
CO2: 27 mmol/L (ref 22–32)
Calcium: 9 mg/dL (ref 8.9–10.3)
Chloride: 101 mmol/L (ref 98–111)
Creatinine, Ser: 0.81 mg/dL (ref 0.61–1.24)
GFR, Estimated: >60 mL/min (ref >60)
Glucose, Bld: 98 mg/dL (ref 70–99)
Potassium: 3.5 mmol/L (ref 3.5–5.1)
Sodium: 137 mmol/L (ref 135–145)
Total Bilirubin: 0.5 mg/dL (ref 0.3–1.2)
Total Protein: 7.4 g/dL (ref 6.5–8.1)

## 2022-01-18 LAB — CBC
HCT: 43 % (ref 39.0–52.0)
Hemoglobin: 14.8 g/dL (ref 13.0–17.0)
MCH: 32.8 pg (ref 26.0–34.0)
MCHC: 34.4 g/dL (ref 30.0–36.0)
MCV: 95.3 fL (ref 80.0–100.0)
Platelets: 263 10*3/uL (ref 150–400)
RBC: 4.51 MIL/uL (ref 4.22–5.81)
RDW: 12.8 % (ref 11.5–15.5)
WBC: 4.2 10*3/uL (ref 4.0–10.5)
nRBC: 0 % (ref 0.0–0.2)

## 2022-01-18 LAB — LIPID PANEL
Cholesterol: 136 mg/dL (ref 0–200)
HDL: 35 mg/dL — ABNORMAL LOW (ref >40)
LDL Cholesterol: 87 mg/dL (ref 0–99)
Total CHOL/HDL Ratio: 3.9 RATIO
Triglycerides: 71 mg/dL (ref <150)
VLDL: 14 mg/dL (ref 0–40)

## 2022-01-18 LAB — TSH: TSH: 1.077 u[IU]/mL (ref 0.350–4.500)

## 2022-01-31 ENCOUNTER — Other Ambulatory Visit: Payer: Self-pay | Admitting: Physician Assistant

## 2022-01-31 DIAGNOSIS — Z1211 Encounter for screening for malignant neoplasm of colon: Secondary | ICD-10-CM

## 2022-01-31 LAB — POC FIT TEST STOOL: Fecal Occult Blood: NEGATIVE

## 2022-02-11 ENCOUNTER — Ambulatory Visit (HOSPITAL_COMMUNITY)
Admission: RE | Admit: 2022-02-11 | Discharge: 2022-02-11 | Disposition: A | Discharge status: Home / Self Care | Payer: Self-pay | Source: Ambulatory Visit | Attending: Physician Assistant | Admitting: Physician Assistant

## 2022-02-11 ENCOUNTER — Encounter (HOSPITAL_COMMUNITY): Payer: Self-pay

## 2022-02-11 DIAGNOSIS — R634 Abnormal weight loss: Secondary | ICD-10-CM | POA: Insufficient documentation

## 2022-02-11 NOTE — Patient Instructions (Signed)
? ? ? ? ? ? ? ? Edgar Roberts ? 02/11/2022  ?  ? @PREFPERIOPPHARMACY @ ? ? Your procedure is scheduled on  02/18/2022. ? ? Report to 02/20/2022 at  0600 A.M. ? ? Call this number if you have problems the morning of surgery: ? [phone] ? ? Remember: ? Do not eat or drink after midnight. ? ?  Use your inhaler before you come and bring your rescue inhaler with you. ?  ? Take these medicines the morning of surgery with A SIP OF WATER  ? ?                                    omeprazole. ?  ? Do not wear jewelry, make-up or nail polish. ? Do not wear lotions, powders, or perfumes, or deodorant. ? Do not shave 48 hours prior to surgery.  Men may shave face and neck. ? Do not bring valuables to the hospital. ? [facility] is not responsible for any belongings or valuables. ? ?Contacts, dentures or bridgework may not be worn into surgery.  Leave your suitcase in the car.  After surgery it may be brought to your room. ? ?For patients admitted to the hospital, discharge time will be determined by your treatment team. ? ?Patients discharged the day of surgery will not be allowed to drive home and must have someone with them for 24 hours.  ? ? ?Special instructions:   DO NOT smoke tobacco or vape for 24 hours before your procedure. ? ?Please read over the following fact sheets that you were given. ?Coughing and Deep Breathing, Surgical Site Infection Prevention, Anesthesia Post-op Instructions, and Care and Recovery After Surgery ?  ? ? ? Open Hernia Repair, Adult, Care After ?What can I expect after the procedure? ?After the procedure, it is common to have: ?Mild discomfort. ?Slight bruising. ?Mild swelling. ?Pain in the belly (abdomen). ?A small amount of blood from the cut from surgery (incision). ?Follow these instructions at home: ?Your doctor may give you more specific instructions. If you have problems, call your doctor. ?Medicines ?Take over-the-counter and prescription medicines only as told by your doctor. ?If told,  take steps to prevent problems with pooping (constipation). You may need to: ?Drink enough fluid to keep your pee (urine) pale yellow. ?Take medicines. You will be told what medicines to take. ?Eat foods that are high in fiber. These include beans, whole grains, and fresh fruits and vegetables. ?Limit foods that are high in fat and sugar. These include fried or sweet foods. ?Ask your doctor if you should avoid driving or using machines while you are taking your medicine. ?Incision care ? ?Follow instructions from your doctor about how to take care of your incision. Make sure you: ?Wash your hands with soap and water for at least 20 seconds before and after you change your bandage (dressing). If you cannot use soap and water, use hand sanitizer. ?Change your bandage. ?Leave stitches or skin glue in place for at least 2 weeks. ?Leave tape strips alone unless you are told to take them off. You may trim the edges of the tape strips if they curl up. ?Check your incision every day for signs of infection. Check for: ?More redness, swelling, or pain. ?More fluid or blood. ?Warmth. ?Pus or a bad smell. ?Wear loose, soft clothing while your incision heals. ?Activity ? ?Rest as told by your doctor. ?Do not  lift anything that is heavier than 10 lb (4.5 kg), or the limit that you are told. ?Do not play contact sports until your doctor says that this is safe. ?If you were given a sedative during your procedure, do not drive or use machines until your doctor says that it is safe. A sedative is a medicine that helps you relax. ?Return to your normal activities when your doctor says that it is safe. ?General instructions ?Do not take baths, swim, or use a hot tub. Ask your doctor about taking showers or sponge baths. ?Hold a pillow over your belly when you cough or sneeze. This helps with pain. ?Do not smoke or use any products that contain nicotine or tobacco. If you need help quitting, ask your doctor. ?Keep all follow-up  visits. ?Contact a doctor if: ?You have any of these signs of infection in or around your incision: ?More redness, swelling, or pain. ?More fluid or blood. ?Warmth. ?Pus. ?A bad smell. ?You have a fever or chills. ?You have blood in your poop (stool). ?You have not pooped (had a bowel movement) in 2-3 days. ?Medicine does not help your pain. ?Get help right away if: ?You have chest pain, or you are short of breath. ?You feel faint or light-headed. ?You have very bad pain. ?You vomit and your pain is worse. ?You have pain, swelling, or redness in a leg. ?These symptoms may be an emergency. Get help right away. Call your local emergency services (911 in the [place] ?Do not wait to see if the symptoms will go away. ?Do not drive yourself to the hospital. ?Summary ?After this procedure, it is common to have mild discomfort, slight bruising, and mild swelling. ?Follow instructions from your doctor about how to take care of your cut from surgery (incision). Check every day for signs of infection. ?Do not lift heavy objects or play contact sports until your doctor says it is safe. ?Return to your normal activities as told by your doctor. ?This information is not intended to replace advice given to you by your health care provider. Make sure you discuss any questions you have with your health care provider. ?Document Revised: 05/29/2020 Document Reviewed: 05/29/2020 ?Elsevier Patient Education ? 2023 [employer] ? ?General Anesthesia, Adult, Care After ?This sheet gives you information about how to care for yourself after your procedure. Your health care provider may also give you more specific instructions. If you have problems or questions, contact your health care provider. ?What can I expect after the procedure? ?After the procedure, the following side effects are common: ?Pain or discomfort at the IV site. ?Nausea. ?Vomiting. ?Sore throat. ?Trouble concentrating. ?Feeling cold or chills. ?Feeling weak or  tired. ?Sleepiness and fatigue. ?Soreness and body aches. These side effects can affect parts of the body that were not involved in surgery. ?Follow these instructions at home: ?For the time period you were told by your health care provider: ? ?Rest. ?Do not participate in activities where you could fall or become injured. ?Do not drive or use machinery. ?Do not drink alcohol. ?Do not take sleeping pills or medicines that cause drowsiness. ?Do not make important decisions or sign legal documents. ?Do not take care of children on your own. ?Eating and drinking ?Follow any instructions from your health care provider about eating or drinking restrictions. ?When you feel hungry, start by eating small amounts of foods that are soft and easy to digest (bland), such as toast. Gradually return to your regular diet. ?Drink enough fluid  to keep your urine pale yellow. ?If you vomit, rehydrate by drinking water, juice, or clear broth. ?General instructions ?If you have sleep apnea, surgery and certain medicines can increase your risk for breathing problems. Follow instructions from your health care provider about wearing your sleep device: ?Anytime you are sleeping, including during daytime naps. ?While taking prescription pain medicines, sleeping medicines, or medicines that make you drowsy. ?Have a responsible adult stay with you for the time you are told. It is important to have someone help care for you until you are awake and alert. ?Return to your normal activities as told by your health care provider. Ask your health care provider what activities are safe for you. ?Take over-the-counter and prescription medicines only as told by your health care provider. ?If you smoke, do not smoke without supervision. ?Keep all follow-up visits as told by your health care provider. This is important. ?Contact a health care provider if: ?You have nausea or vomiting that does not get better with medicine. ?You cannot eat or drink  without vomiting. ?You have pain that does not get better with medicine. ?You are unable to pass urine. ?You develop a skin rash. ?You have a fever. ?You have redness around your IV site that gets worse. ?Get help right away if

## 2022-02-12 ENCOUNTER — Encounter: Payer: Self-pay | Admitting: Physician Assistant

## 2022-02-12 ENCOUNTER — Ambulatory Visit: Payer: Self-pay | Admitting: Physician Assistant

## 2022-02-12 VITALS — BP 138/80 | HR 75 | Temp 98.2°F | Wt 137.5 lb

## 2022-02-12 DIAGNOSIS — K4090 Unilateral inguinal hernia, without obstruction or gangrene, not specified as recurrent: Secondary | ICD-10-CM

## 2022-02-12 DIAGNOSIS — F424 Excoriation (skin-picking) disorder: Secondary | ICD-10-CM

## 2022-02-12 DIAGNOSIS — E785 Hyperlipidemia, unspecified: Secondary | ICD-10-CM

## 2022-02-12 DIAGNOSIS — J449 Chronic obstructive pulmonary disease, unspecified: Secondary | ICD-10-CM

## 2022-02-12 DIAGNOSIS — F1290 Cannabis use, unspecified, uncomplicated: Secondary | ICD-10-CM

## 2022-02-12 DIAGNOSIS — R634 Abnormal weight loss: Secondary | ICD-10-CM

## 2022-02-12 MED ORDER — ALBUTEROL SULFATE HFA 108 (90 BASE) MCG/ACT IN AERS: 2.0000 | INHALATION_SPRAY | Freq: Four times a day (QID) | RESPIRATORY_TRACT | 0 refills | Status: DC | PRN

## 2022-02-12 NOTE — Progress Notes (Signed)
? ?BP 138/80   Pulse 75   Temp 98.2 ?F (36.8 ?C)   Wt 137 lb 8 oz (62.4 kg)   SpO2 97%   BMI 18.65 kg/m?   ? ?Subjective:  ? ? Patient ID: Edgar Roberts, male    DOB: 1961-07-21, 61 y.o.   MRN: [id number] ? ?HPI: ?Edgar Roberts is a 61 y.o. male presenting on 02/12/2022 for No chief complaint on file. ? ? ?HPI ? ? ?Pt is 5yoM with several issues here today for follow up ? ?He is scheduled for hernia surgery next week ? ?He doesn't know if he submitted his cafa/application for [employer] financial assistance ? ?He says his MH is doing fine.  He denies anxiety/depression but continues to pick at his skin.   ? ? ? ?Relevant past medical, surgical, family and social history reviewed and updated as indicated. Interim medical history since our last visit reviewed. ?Allergies and medications reviewed and updated. ? ? ? ?Current Outpatient Medications:  ?  aspirin EC 81 MG tablet, Take 1 tablet (81 mg total) by mouth daily., Disp: 90 tablet, Rfl: 3 ?  atorvastatin (LIPITOR) 40 MG tablet, Take 1 tablet (40 mg total) by mouth daily., Disp: 90 tablet, Rfl: 0 ?  clotrimazole-betamethasone (LOTRISONE) cream, Apply 1 application. topically 2 (two) times daily., Disp: 15 g, Rfl: 1 ?  fluticasone-salmeterol (WIXELA INHUB) 100-50 MCG/ACT AEPB, Inhale 1 puff into the lungs 2 (two) times daily., Disp: 1 each, Rfl: 0 ?  Omeprazole 20 MG TBEC, Take 1 tablet (20 mg total) by mouth daily before breakfast., Disp: 90 tablet, Rfl: 0 ?  albuterol (VENTOLIN HFA) 108 (90 Base) MCG/ACT inhaler, Inhale 2 puffs into the lungs every 6 (six) hours as needed for wheezing or shortness of breath., Disp: 3 each, Rfl: 0 ?  nitroGLYCERIN (NITROSTAT) 0.4 MG SL tablet, Place 1 tablet (0.4 mg total) under the tongue every 5 (five) minutes as needed for chest pain. May repeat for total 3 doses.  If still having CP, go to ER, Disp: 25 tablet, Rfl: 3 ? ? ? ? ?Review of Systems ? ?Per HPI unless specifically indicated above ? ?   ?Objective:  ?  ?BP  138/80   Pulse 75   Temp 98.2 ?F (36.8 ?C)   Wt 137 lb 8 oz (62.4 kg)   SpO2 97%   BMI 18.65 kg/m?   ?Wt Readings from Last 3 Encounters:  ?02/12/22 137 lb 8 oz (62.4 kg)  ?01/17/22 134 lb (60.8 kg)  ?01/09/22 136 lb (61.7 kg)  ?  ?Physical Exam ?Vitals reviewed.  ?Constitutional:   ?   General: He is not in acute distress. ?   Appearance: He is well-developed. He is not toxic-appearing.  ?HENT:  ?   Head: Normocephalic and atraumatic.  ?Cardiovascular:  ?   Rate and Rhythm: Normal rate and regular rhythm.  ?Pulmonary:  ?   Effort: Pulmonary effort is normal.  ?   Breath sounds: Normal breath sounds. No wheezing.  ?Abdominal:  ?   General: Bowel sounds are normal.  ?   Palpations: Abdomen is soft.  ?   Tenderness: There is no abdominal tenderness.  ?Musculoskeletal:  ?   Cervical back: Neck supple.  ?   Right lower leg: No edema.  ?   Left lower leg: No edema.  ?Lymphadenopathy:  ?   Cervical: No cervical adenopathy.  ?Skin: ?   General: Skin is warm and dry.  ?Neurological:  ?   Mental Status: He  is alert and oriented to person, place, and time.  ?Psychiatric:     ?   Behavior: Behavior normal.  ? ? ?Results for orders placed or performed in visit on 01/31/22  ?POC FIT Test  ?Result Value Ref Range  ? Fecal Occult Blood Negative   ? ?   ?Assessment & Plan:  ? ?Encounter Diagnoses  ?Name Primary?  ? Right inguinal hernia Yes  ? Chronic obstructive pulmonary disease, unspecified COPD type ([place])   ? Weight loss   ? Picking own skin   ? Marijuana smoker   ? Hyperlipidemia, unspecified hyperlipidemia type   ? ? ? ?-Blood labs and FIT normal.  Reviewed with pt ?-Will call pt with CT results (CT done yesterday not yet read by radiologist) ?-Ordered albuterol mdi ?-covid Booster recommended ?-encouraged pt to stop smoking MJ ?-encouraged pt to eat healthy, recommending booster , ensure or similar ?-pt to follow up 6 wk.  He is to contact office sooner prn ? ?

## 2022-02-13 ENCOUNTER — Encounter (HOSPITAL_COMMUNITY): Payer: Self-pay

## 2022-02-13 ENCOUNTER — Encounter (HOSPITAL_COMMUNITY)
Admission: RE | Admit: 2022-02-13 | Discharge: 2022-02-13 | Disposition: A | Discharge status: Home / Self Care | Payer: Self-pay | Source: Ambulatory Visit | Attending: General Surgery | Admitting: General Surgery

## 2022-02-13 DIAGNOSIS — B182 Chronic viral hepatitis C: Secondary | ICD-10-CM

## 2022-02-18 ENCOUNTER — Ambulatory Visit (HOSPITAL_COMMUNITY): Admission: RE | Admit: 2022-02-18 | Payer: Self-pay | Source: Home / Self Care | Admitting: General Surgery

## 2022-02-18 ENCOUNTER — Encounter (HOSPITAL_COMMUNITY): Admission: RE | Payer: Self-pay | Source: Home / Self Care

## 2022-02-18 DIAGNOSIS — K4090 Unilateral inguinal hernia, without obstruction or gangrene, not specified as recurrent: Secondary | ICD-10-CM

## 2022-02-18 SURGERY — REPAIR, HERNIA, INGUINAL, ADULT
Anesthesia: General | Laterality: Right

## 2022-03-19 ENCOUNTER — Other Ambulatory Visit: Payer: Self-pay | Admitting: Physician Assistant

## 2022-03-26 ENCOUNTER — Ambulatory Visit: Payer: Self-pay | Admitting: Physician Assistant

## 2022-04-10 ENCOUNTER — Ambulatory Visit: Payer: Self-pay | Admitting: Cardiology

## 2022-05-28 ENCOUNTER — Ambulatory Visit (INDEPENDENT_AMBULATORY_CARE_PROVIDER_SITE_OTHER): Payer: Self-pay | Admitting: General Surgery

## 2022-05-28 ENCOUNTER — Encounter: Payer: Self-pay | Admitting: General Surgery

## 2022-05-28 VITALS — BP 155/94 | HR 68 | Temp 98.0°F | Resp 14 | Ht 72.0 in | Wt 135.0 lb

## 2022-05-28 DIAGNOSIS — K4090 Unilateral inguinal hernia, without obstruction or gangrene, not specified as recurrent: Secondary | ICD-10-CM

## 2022-05-28 DIAGNOSIS — L02213 Cutaneous abscess of chest wall: Secondary | ICD-10-CM | POA: Insufficient documentation

## 2022-05-28 MED ORDER — AMOXICILLIN-POT CLAVULANATE 875-125 MG PO TABS: 1.0000 | ORAL_TABLET | Freq: Two times a day (BID) | ORAL | 0 refills | Status: DC

## 2022-05-28 NOTE — Patient Instructions (Signed)
Pack chest wall abscess cavity daily with packing, can squirt saline on the packing before removal if needed. Cover with bandaid. The packing can stop after it is superficial and healed up (probably about 1 week of packing) and then cover with neosporin and bandaid.  If something is not healing or is concerning call the office  Will send in Antibiotic.  Inguinal Hernia, Adult An inguinal hernia is when fat or your intestines push through a weak spot in a muscle where your leg meets your lower belly (groin). This causes a bulge. This kind of hernia could also be: In your scrotum, if you are male. In folds of skin around your vagina, if you are male. There are three types of inguinal hernias: Hernias that can be pushed back into the belly (are reducible). This type rarely causes pain. Hernias that cannot be pushed back into the belly (are incarcerated). Hernias that cannot be pushed back into the belly and lose their blood supply (are strangulated). This type needs emergency surgery. What are the causes? This condition is caused by having a weak spot in the muscles or tissues in your groin. This develops over time. The hernia may poke through the weak spot when you strain your lower belly muscles all of a sudden, such as when you: Lift a heavy object. Strain to poop (have a bowel movement). Trouble pooping (constipation) can lead to straining. Cough. What increases the risk? This condition is more likely to develop in: Males. Pregnant females. People who: Are overweight. Work in jobs that require long periods of standing or heavy lifting. Have had an inguinal hernia before. Smoke or have lung disease. These factors can lead to long-term (chronic) coughing. What are the signs or symptoms? Symptoms may depend on the size of the hernia. Often, a small hernia has no symptoms. Symptoms of a larger hernia may include: A bulge in the groin area. This is easier to see when standing. You might  not be able to see it when you are lying down. Pain or burning in the groin. This may get worse when you lift, strain, or cough. A dull ache or a feeling of pressure in the groin. An abnormal bulge in the scrotum, in males. Symptoms of a strangulated inguinal hernia may include: A bulge in your groin that is very painful and tender to the touch. A bulge that turns red or purple. Fever, feeling like you may vomit (nausea), and vomiting. Not being able to poop or to pass gas. How is this treated? Treatment depends on the size of your hernia and whether you have symptoms. If you do not have symptoms, your doctor may have you watch your hernia carefully and have you come in for follow-up visits. If your hernia is large or if you have symptoms, you may need surgery to repair the hernia. Follow these instructions at home: Lifestyle Avoid lifting heavy objects. Avoid standing for long amounts of time. Do not smoke or use any products that contain nicotine or tobacco. If you need help quitting, ask your doctor. Stay at a healthy weight. Prevent trouble pooping You may need to take these actions to prevent or treat trouble pooping: Drink enough fluid to keep your pee (urine) pale yellow. Take over-the-counter or prescription medicines. Eat foods that are high in fiber. These include beans, whole grains, and fresh fruits and vegetables. Limit foods that are high in fat and sugar. These include fried or sweet foods. General instructions You may try to push  your hernia back in place by very gently pressing on it when you are lying down. Do not try to push the bulge back in if it will not go in easily. Watch your hernia for any changes in shape, size, or color. Tell your doctor if you see any changes. Take over-the-counter and prescription medicines only as told by your doctor. Keep all follow-up visits. Contact a doctor if: You have a fever or chills. You have new symptoms. Your symptoms get  worse. Get help right away if: You have pain in your groin that gets worse all of a sudden. You have a bulge in your groin that: Gets bigger all of a sudden, and it does not get smaller after that. Turns red or purple. Is painful when you touch it. You are a male, and you have: Sudden pain in your scrotum. A sudden change in the size of your scrotum. You cannot push the hernia back in place by very gently pressing on it when you are lying down. You feel like you may vomit, and that feeling does not go away. You keep vomiting. You have a fast heartbeat. You cannot poop or pass gas. These symptoms may be an emergency. Get help right away. Call your local emergency services (911 in the [place] Do not wait to see if the symptoms will go away. Do not drive yourself to the hospital. Summary An inguinal hernia is when fat or your intestines push through a weak spot in a muscle where your leg meets your lower belly (groin). This causes a bulge. If you do not have symptoms, you may not need treatment. If you have symptoms or a large hernia, you may need surgery. Avoid lifting heavy objects. Also, avoid standing for long amounts of time. Do not try to push the bulge back in if it will not go in easily. This information is not intended to replace advice given to you by your health care provider. Make sure you discuss any questions you have with your health care provider. Document Revised: 06/13/2020 Document Reviewed: 06/13/2020 Elsevier Patient Education  2023 [employer].

## 2022-05-28 NOTE — Progress Notes (Signed)
[facility] History and Physical    Chief Complaint   Follow-up     Edgar Roberts is a 61 y.o. male.  HPI: Patient with bilateral inguinal hernias who wants to proceed with getting the right side repaired as this is the most symptomatic. He comes in today after getting a CT chest in April that demonstrated nodules most likely related to granulomatous changes from prior infection, recommending a repeat in 3-6 months.   He says that he has been well except he noticed a large boil on his right chest that opened and drained over the past few days.  There has been redness around the area and this has improved some with the drainage. He has not had any prior abscesses or boils.   Past Medical History:  Diagnosis Date   Asthma    Chronic hepatitis C (HCC)    COPD (chronic obstructive pulmonary disease) (HCC)    GERD (gastroesophageal reflux disease)     Past Surgical History:  Procedure Laterality Date   BIOPSY N/A 09/04/2015   Procedure: BIOPSY;  Surgeon: Corbin Ade, MD;  Location: [facility];  Service: Endoscopy;  Laterality: N/A;  gastric   ESOPHAGEAL DILATION N/A 09/04/2015   Procedure:  ESOPHAGEAL DILATION;  Surgeon: Corbin Ade, MD;  Location: [facility];  Service: Endoscopy;  Laterality: N/A;  maloney 56   ESOPHAGOGASTRODUODENOSCOPY (EGD) WITH PROPOFOL N/A 09/04/2015   Dr. Jena Gauss: mild erosive reflux esophagitis. H.pylori gastritis   none      Family History  Problem Relation Age of Onset   Cirrhosis Father        deceased   Heart disease Sister        3 open heart surgeries   Heart disease Mother    Heart disease Other        grandmother   Colon cancer Neg Hx     Social History   Tobacco Use   Smoking status: Former    Packs/day: 2.00    Years: 5.00    Total pack years: 10.00    Types: Cigarettes    Start date: 06/06/1976    Quit date: 10/28/1982    Years since quitting: 39.6   Smokeless tobacco: Never   Tobacco comments:    only smokes marijuana   Vaping Use   Vaping Use: Never used  Substance Use Topics   Alcohol use: No    Alcohol/week: 0.0 standard drinks of alcohol    Comment: quit 01/2014   Drug use: Yes    Types: Marijuana    Comment: everyday    Medications: I have reviewed the patient's current medications. Allergies as of 05/28/2022   No Known Allergies      Medication List        Accurate as of May 28, 2022 12:21 PM. If you have any questions, ask your nurse or doctor.          albuterol 108 (90 Base) MCG/ACT inhaler Commonly known as: VENTOLIN HFA Inhale 2 puffs into the lungs every 6 (six) hours as needed for wheezing or shortness of breath.   aspirin EC 81 MG tablet Take 1 tablet (81 mg total) by mouth daily.   atorvastatin 40 MG tablet Commonly known as: LIPITOR TAKE 1 Tablet BY MOUTH ONCE EVERY DAY   clotrimazole-betamethasone cream Commonly known as: Lotrisone Apply 1 application. topically 2 (two) times daily.   fluticasone-salmeterol 100-50 MCG/ACT Aepb Commonly known as: Wixela Inhub Inhale 1 puff into the lungs 2 (two) times  daily.   nitroGLYCERIN 0.4 MG SL tablet Commonly known as: NITROSTAT Place 1 tablet (0.4 mg total) under the tongue every 5 (five) minutes as needed for chest pain. May repeat for total 3 doses.  If still having CP, go to ER   omeprazole 20 MG capsule Commonly known as: PRILOSEC TAKE 1 Capsule BY MOUTH ONCE EVERY DAY BEFORE BREAKFAST         ROS:  A comprehensive review of systems was negative except for: Gastrointestinal: positive for bilateral inguinal hernias Musculoskeletal: positive for right chest nodule with cellulitis, raised and drainage   Blood pressure (!) 155/94, pulse 68, temperature 98 F (36.7 C), temperature source Oral, resp. rate 14, height 6' (1.829 m), weight 135 lb (61.2 kg), SpO2 98 %. Physical Exam Vitals reviewed.  Constitutional:      Appearance: Normal appearance.  HENT:     Head: Normocephalic.     Nose: Nose normal.      Mouth/Throat:     Mouth: Mucous membranes are moist.  Eyes:     Extraocular Movements: Extraocular movements intact.  Cardiovascular:     Rate and Rhythm: Normal rate and regular rhythm.  Pulmonary:     Effort: Pulmonary effort is normal.     Breath sounds: Normal breath sounds.  Chest:     Comments: Right chest wall nodule with erythema surrounding, open necrotic area centrally from drainage Abdominal:     General: There is no distension.     Palpations: Abdomen is soft.     Tenderness: There is no abdominal tenderness.  Musculoskeletal:        General: Normal range of motion.     Cervical back: Normal range of motion.  Skin:    General: Skin is warm.  Neurological:     General: No focal deficit present.     Mental Status: He is alert.  Psychiatric:        Mood and Affect: Mood normal.        Thought Content: Thought content normal.      Results: CT chest- reviewed and do not see any cyst or obvious mass or nodule on the right chest subcutaneous tissue   Procedure: Incision and drainage of abscess, excision of necrotic material Diagnosis: Infected nodule on right chest Description: Betadine applied to the area, lidocaine 1% was injected. The necrotic central area was excised with an 11 blade. This was hard and woody, minimal purulence drained. No obvious cyst capsule. Concerning area of the central necrosis sent for pathology.  Assessment & Plan:  Edgar Roberts is a 61 y.o. male with bilateral inguinal hernias and a new infected nodule? Possible cyst on the chest. There is no evidence of this on the CT chest that I can see from April. He has not had anything like this before. He does have nodules in the lungs but this was favored to be granulomatous disease as he has no other history of cancer, but does have a history of weight loss.   Will wait to see if the pathology reveals anything more concerning. Plan for inguinal hernia repair on the right.   Discussed the risk and  benefits including, bleeding, infection, use of mesh, risk of recurrence, risk of nerve damage causing numbness or changes in sensation, risk of damage to the cord structures. The patient understands the risk and benefits of repair with mesh, and has decided to proceed.  We also discussed open versus laparoscopic surgery and the use of mesh. We discussed that  I do open repairs with mesh, and that this is considered equivalent to laparoscopic surgery. We discussed reasons for opting for laparoscopic surgery including if a bilateral repair is needed or if a patient has a recurrence after an open repair.   All questions were answered to the satisfaction of the patient.  Pack chest wall abscess cavity daily with packing, can squirt saline on the packing before removal if needed. Cover with bandaid. The packing can stop after it is superficial and healed up (probably about 1 week of packing) and then cover with neosporin and bandaid.  If something is not healing or is concerning call the office  Will send in Augmentin for 7 days.    Virl Cagey 05/28/2022, 12:21 PM

## 2022-05-28 NOTE — H&P (Signed)
[facility] History and Physical    Chief Complaint   Follow-up     Edgar Roberts is a 61 y.o. male.  HPI: Patient with bilateral inguinal hernias who wants to proceed with getting the right side repaired as this is the most symptomatic. He comes in today after getting a CT chest in April that demonstrated nodules most likely related to granulomatous changes from prior infection, recommending a repeat in 3-6 months.   He says that he has been well except he noticed a large boil on his right chest that opened and drained over the past few days.  There has been redness around the area and this has improved some with the drainage. He has not had any prior abscesses or boils.   Past Medical History:  Diagnosis Date   Asthma    Chronic hepatitis C (HCC)    COPD (chronic obstructive pulmonary disease) (HCC)    GERD (gastroesophageal reflux disease)     Past Surgical History:  Procedure Laterality Date   BIOPSY N/A 09/04/2015   Procedure: BIOPSY;  Surgeon: Corbin Ade, MD;  Location: [facility];  Service: Endoscopy;  Laterality: N/A;  gastric   ESOPHAGEAL DILATION N/A 09/04/2015   Procedure:  ESOPHAGEAL DILATION;  Surgeon: Corbin Ade, MD;  Location: [facility];  Service: Endoscopy;  Laterality: N/A;  maloney 56   ESOPHAGOGASTRODUODENOSCOPY (EGD) WITH PROPOFOL N/A 09/04/2015   Dr. Jena Gauss: mild erosive reflux esophagitis. H.pylori gastritis   none      Family History  Problem Relation Age of Onset   Cirrhosis Father        deceased   Heart disease Sister        3 open heart surgeries   Heart disease Mother    Heart disease Other        grandmother   Colon cancer Neg Hx     Social History   Tobacco Use   Smoking status: Former    Packs/day: 2.00    Years: 5.00    Total pack years: 10.00    Types: Cigarettes    Start date: 06/06/1976    Quit date: 10/28/1982    Years since quitting: 39.6   Smokeless tobacco: Never   Tobacco comments:    only smokes marijuana   Vaping Use   Vaping Use: Never used  Substance Use Topics   Alcohol use: No    Alcohol/week: 0.0 standard drinks of alcohol    Comment: quit 01/2014   Drug use: Yes    Types: Marijuana    Comment: everyday    Medications: I have reviewed the patient's current medications. Allergies as of 05/28/2022   No Known Allergies      Medication List        Accurate as of May 28, 2022 12:21 PM. If you have any questions, ask your nurse or doctor.          albuterol 108 (90 Base) MCG/ACT inhaler Commonly known as: VENTOLIN HFA Inhale 2 puffs into the lungs every 6 (six) hours as needed for wheezing or shortness of breath.   aspirin EC 81 MG tablet Take 1 tablet (81 mg total) by mouth daily.   atorvastatin 40 MG tablet Commonly known as: LIPITOR TAKE 1 Tablet BY MOUTH ONCE EVERY DAY   clotrimazole-betamethasone cream Commonly known as: Lotrisone Apply 1 application. topically 2 (two) times daily.   fluticasone-salmeterol 100-50 MCG/ACT Aepb Commonly known as: Wixela Inhub Inhale 1 puff into the lungs 2 (two) times  daily.   nitroGLYCERIN 0.4 MG SL tablet Commonly known as: NITROSTAT Place 1 tablet (0.4 mg total) under the tongue every 5 (five) minutes as needed for chest pain. May repeat for total 3 doses.  If still having CP, go to ER   omeprazole 20 MG capsule Commonly known as: PRILOSEC TAKE 1 Capsule BY MOUTH ONCE EVERY DAY BEFORE BREAKFAST         ROS:  A comprehensive review of systems was negative except for: Gastrointestinal: positive for bilateral inguinal hernias Musculoskeletal: positive for right chest nodule with cellulitis, raised and drainage   Blood pressure (!) 155/94, pulse 68, temperature 98 F (36.7 C), temperature source Oral, resp. rate 14, height 6' (1.829 m), weight 135 lb (61.2 kg), SpO2 98 %. Physical Exam Vitals reviewed.  Constitutional:      Appearance: Normal appearance.  HENT:     Head: Normocephalic.     Nose: Nose normal.      Mouth/Throat:     Mouth: Mucous membranes are moist.  Eyes:     Extraocular Movements: Extraocular movements intact.  Cardiovascular:     Rate and Rhythm: Normal rate and regular rhythm.  Pulmonary:     Effort: Pulmonary effort is normal.     Breath sounds: Normal breath sounds.  Chest:     Comments: Right chest wall nodule with erythema surrounding, open necrotic area centrally from drainage Abdominal:     General: There is no distension.     Palpations: Abdomen is soft.     Tenderness: There is no abdominal tenderness.  Musculoskeletal:        General: Normal range of motion.     Cervical back: Normal range of motion.  Skin:    General: Skin is warm.  Neurological:     General: No focal deficit present.     Mental Status: He is alert.  Psychiatric:        Mood and Affect: Mood normal.        Thought Content: Thought content normal.      Results: CT chest- reviewed and do not see any cyst or obvious mass or nodule on the right chest subcutaneous tissue   Procedure: Incision and drainage of abscess, excision of necrotic material Diagnosis: Infected nodule on right chest Description: Betadine applied to the area, lidocaine 1% was injected. The necrotic central area was excised with an 11 blade. This was hard and woody, minimal purulence drained. No obvious cyst capsule. Concerning area of the central necrosis sent for pathology.  Assessment & Plan:  Edgar Roberts is a 60 y.o. male with bilateral inguinal hernias and a new infected nodule? Possible cyst on the chest. There is no evidence of this on the CT chest that I can see from April. He has not had anything like this before. He does have nodules in the lungs but this was favored to be granulomatous disease as he has no other history of cancer, but does have a history of weight loss.   Will wait to see if the pathology reveals anything more concerning. Plan for inguinal hernia repair on the right.   Discussed the risk and  benefits including, bleeding, infection, use of mesh, risk of recurrence, risk of nerve damage causing numbness or changes in sensation, risk of damage to the cord structures. The patient understands the risk and benefits of repair with mesh, and has decided to proceed.  We also discussed open versus laparoscopic surgery and the use of mesh. We discussed that   I do open repairs with mesh, and that this is considered equivalent to laparoscopic surgery. We discussed reasons for opting for laparoscopic surgery including if a bilateral repair is needed or if a patient has a recurrence after an open repair.   All questions were answered to the satisfaction of the patient.  Pack chest wall abscess cavity daily with packing, can squirt saline on the packing before removal if needed. Cover with bandaid. The packing can stop after it is superficial and healed up (probably about 1 week of packing) and then cover with neosporin and bandaid.  If something is not healing or is concerning call the office  Will send in Augmentin for 7 days.    Lucretia Roers 05/28/2022, 12:21 PM

## 2022-05-31 LAB — TISSUE SPECIMEN

## 2022-05-31 LAB — PATHOLOGY REPORT

## 2022-06-10 NOTE — Patient Instructions (Signed)
Edgar Roberts  06/10/2022     @PREFPERIOPPHARMACY @   Your procedure is scheduled on  06/14/2022.   Report to 06/16/2022 at  0730  A.M.   Call this number if you have problems the morning of surgery:  [phone]   Remember:  Do not eat or drink after midnight.       Use your inhaler before you come and bring your rescue inhaler with you.     Take these medicines the morning of surgery with A SIP OF WATER                                                 Prilosec.     Do not wear jewelry, make-up or nail polish.  Do not wear lotions, powders, or perfumes, or deodorant.  Do not shave 48 hours prior to surgery.  Men may shave face and neck.  Do not bring valuables to the hospital.  [facility] is not responsible for any belongings or valuables.  Contacts, dentures or bridgework may not be worn into surgery.  Leave your suitcase in the car.  After surgery it may be brought to your room.  For patients admitted to the hospital, discharge time will be determined by your treatment team.  Patients discharged the day of surgery will not be allowed to drive home and must have someone with them for 24 hours.     Special instructions:   DO NOT smoke tobacco or vape for 24 hours before your procedure.  Please read over the following fact sheets that you were given. Coughing and Deep Breathing, Surgical Site Infection Prevention, Anesthesia Post-op Instructions, and Care and Recovery After Surgery      Open Hernia Repair, Adult, Care After What can I expect after the procedure? After the procedure, it is common to have: Mild discomfort. Slight bruising. Mild swelling. Pain in the belly (abdomen). A small amount of blood from the cut from surgery (incision). Follow these instructions at home: Your doctor may give you more specific instructions. If you have problems, call your doctor. Medicines Take over-the-counter and prescription medicines only as told by your  doctor. If told, take steps to prevent problems with pooping (constipation). You may need to: Drink enough fluid to keep your pee (urine) pale yellow. Take medicines. You will be told what medicines to take. Eat foods that are high in fiber. These include beans, whole grains, and fresh fruits and vegetables. Limit foods that are high in fat and sugar. These include fried or sweet foods. Ask your doctor if you should avoid driving or using machines while you are taking your medicine. Incision care  Follow instructions from your doctor about how to take care of your incision. Make sure you: Wash your hands with soap and water for at least 20 seconds before and after you change your bandage (dressing). If you cannot use soap and water, use hand sanitizer. Change your bandage. Leave stitches or skin glue in place for at least 2 weeks. Leave tape strips alone unless you are told to take them off. You may trim the edges of the tape strips if they curl up. Check your incision every day for signs of infection. Check for: More redness, swelling, or pain. More fluid or blood. Warmth. Pus or a bad smell. Wear  loose, soft clothing while your incision heals. Activity  Rest as told by your doctor. Do not lift anything that is heavier than 10 lb (4.5 kg), or the limit that you are told. Do not play contact sports until your doctor says that this is safe. If you were given a sedative during your procedure, do not drive or use machines until your doctor says that it is safe. A sedative is a medicine that helps you relax. Return to your normal activities when your doctor says that it is safe. General instructions Do not take baths, swim, or use a hot tub. Ask your doctor about taking showers or sponge baths. Hold a pillow over your belly when you cough or sneeze. This helps with pain. Do not smoke or use any products that contain nicotine or tobacco. If you need help quitting, ask your doctor. Keep all  follow-up visits. Contact a doctor if: You have any of these signs of infection in or around your incision: More redness, swelling, or pain. More fluid or blood. Warmth. Pus. A bad smell. You have a fever or chills. You have blood in your poop (stool). You have not pooped (had a bowel movement) in 2-3 days. Medicine does not help your pain. Get help right away if: You have chest pain, or you are short of breath. You feel faint or light-headed. You have very bad pain. You vomit and your pain is worse. You have pain, swelling, or redness in a leg. These symptoms may be an emergency. Get help right away. Call your local emergency services (911 in the [place] Do not wait to see if the symptoms will go away. Do not drive yourself to the hospital. Summary After this procedure, it is common to have mild discomfort, slight bruising, and mild swelling. Follow instructions from your doctor about how to take care of your cut from surgery (incision). Check every day for signs of infection. Do not lift heavy objects or play contact sports until your doctor says it is safe. Return to your normal activities as told by your doctor. This information is not intended to replace advice given to you by your health care provider. Make sure you discuss any questions you have with your health care provider. Document Revised: 05/29/2020 Document Reviewed: 05/29/2020 Elsevier Patient Education  2023 [employer] General Anesthesia, Adult, Care After The following information offers guidance on how to care for yourself after your procedure. Your health care provider may also give you more specific instructions. If you have problems or questions, contact your health care provider. What can I expect after the procedure? After the procedure, it is common for people to: Have pain or discomfort at the IV site. Have nausea or vomiting. Have a sore throat or hoarseness. Have trouble concentrating. Feel cold or  chills. Feel weak, sleepy, or tired (fatigue). Have soreness and body aches. These can affect parts of the body that were not involved in surgery. Follow these instructions at home: For the time period you were told by your health care provider:  Rest. Do not participate in activities where you could fall or become injured. Do not drive or use machinery. Do not drink alcohol. Do not take sleeping pills or medicines that cause drowsiness. Do not make important decisions or sign legal documents. Do not take care of children on your own. General instructions Drink enough fluid to keep your urine pale yellow. If you have sleep apnea, surgery and certain medicines can increase your risk for  breathing problems. Follow instructions from your health care provider about wearing your sleep device: Anytime you are sleeping, including during daytime naps. While taking prescription pain medicines, sleeping medicines, or medicines that make you drowsy. Return to your normal activities as told by your health care provider. Ask your health care provider what activities are safe for you. Take over-the-counter and prescription medicines only as told by your health care provider. Do not use any products that contain nicotine or tobacco. These products include cigarettes, chewing tobacco, and vaping devices, such as e-cigarettes. These can delay incision healing after surgery. If you need help quitting, ask your health care provider. Contact a health care provider if: You have nausea or vomiting that does not get better with medicine. You vomit every time you eat or drink. You have pain that does not get better with medicine. You cannot urinate or have bloody urine. You develop a skin rash. You have a fever. Get help right away if: You have trouble breathing. You have chest pain. You vomit blood. These symptoms may be an emergency. Get help right away. Call 911. Do not wait to see if the symptoms will  go away. Do not drive yourself to the hospital. Summary After the procedure, it is common to have a sore throat, hoarseness, nausea, vomiting, or to feel weak, sleepy, or fatigue. For the time period you were told by your health care provider, do not drive or use machinery. Get help right away if you have difficulty breathing, have chest pain, or vomit blood. These symptoms may be an emergency. This information is not intended to replace advice given to you by your health care provider. Make sure you discuss any questions you have with your health care provider. Document Revised: 01/11/2022 Document Reviewed: 01/11/2022 Elsevier Patient Education  2023 [employer] Inc. How to Use Chlorhexidine Before Surgery Chlorhexidine gluconate (CHG) is a germ-killing (antiseptic) solution that is used to clean the skin. It can get rid of the bacteria that normally live on the skin and can keep them away for about 24 hours. To clean your skin with CHG, you may be given: A CHG solution to use in the shower or as part of a sponge bath. A prepackaged cloth that contains CHG. Cleaning your skin with CHG may help lower the risk for infection: While you are staying in the intensive care unit of the hospital. If you have a vascular access, such as a central line, to provide short-term or long-term access to your veins. If you have a catheter to drain urine from your bladder. If you are on a ventilator. A ventilator is a machine that helps you breathe by moving air in and out of your lungs. After surgery. What are the risks? Risks of using CHG include: A skin reaction. Hearing loss, if CHG gets in your ears and you have a perforated eardrum. Eye injury, if CHG gets in your eyes and is not rinsed out. The CHG product catching fire. Make sure that you avoid smoking and flames after applying CHG to your skin. Do not use CHG: If you have a chlorhexidine allergy or have previously reacted to chlorhexidine. On babies  younger than 90 months of age. How to use CHG solution Use CHG only as told by your health care provider, and follow the instructions on the label. Use the full amount of CHG as directed. Usually, this is one bottle. During a shower Follow these steps when using CHG solution during a shower (unless your  health care provider gives you different instructions): Start the shower. Use your normal soap and shampoo to wash your face and hair. Turn off the shower or move out of the shower stream. Pour the CHG onto a clean washcloth. Do not use any type of brush or rough-edged sponge. Starting at your neck, lather your body down to your toes. Make sure you follow these instructions: If you will be having surgery, pay special attention to the part of your body where you will be having surgery. Scrub this area for at least 1 minute. Do not use CHG on your head or face. If the solution gets into your ears or eyes, rinse them well with water. Avoid your genital area. Avoid any areas of skin that have broken skin, cuts, or scrapes. Scrub your back and under your arms. Make sure to wash skin folds. Let the lather sit on your skin for 1-2 minutes or as long as told by your health care provider. Thoroughly rinse your entire body in the shower. Make sure that all body creases and crevices are rinsed well. Dry off with a clean towel. Do not put any substances on your body afterward--such as powder, lotion, or perfume--unless you are told to do so by your health care provider. Only use lotions that are recommended by the manufacturer. Put on clean clothes or pajamas. If it is the night before your surgery, sleep in clean sheets.  During a sponge bath Follow these steps when using CHG solution during a sponge bath (unless your health care provider gives you different instructions): Use your normal soap and shampoo to wash your face and hair. Pour the CHG onto a clean washcloth. Starting at your neck, lather your  body down to your toes. Make sure you follow these instructions: If you will be having surgery, pay special attention to the part of your body where you will be having surgery. Scrub this area for at least 1 minute. Do not use CHG on your head or face. If the solution gets into your ears or eyes, rinse them well with water. Avoid your genital area. Avoid any areas of skin that have broken skin, cuts, or scrapes. Scrub your back and under your arms. Make sure to wash skin folds. Let the lather sit on your skin for 1-2 minutes or as long as told by your health care provider. Using a different clean, wet washcloth, thoroughly rinse your entire body. Make sure that all body creases and crevices are rinsed well. Dry off with a clean towel. Do not put any substances on your body afterward--such as powder, lotion, or perfume--unless you are told to do so by your health care provider. Only use lotions that are recommended by the manufacturer. Put on clean clothes or pajamas. If it is the night before your surgery, sleep in clean sheets. How to use CHG prepackaged cloths Only use CHG cloths as told by your health care provider, and follow the instructions on the label. Use the CHG cloth on clean, dry skin. Do not use the CHG cloth on your head or face unless your health care provider tells you to. When washing with the CHG cloth: Avoid your genital area. Avoid any areas of skin that have broken skin, cuts, or scrapes. Before surgery Follow these steps when using a CHG cloth to clean before surgery (unless your health care provider gives you different instructions): Using the CHG cloth, vigorously scrub the part of your body where you will be having  surgery. Scrub using a back-and-forth motion for 3 minutes. The area on your body should be completely wet with CHG when you are done scrubbing. Do not rinse. Discard the cloth and let the area air-dry. Do not put any substances on the area afterward, such as  powder, lotion, or perfume. Put on clean clothes or pajamas. If it is the night before your surgery, sleep in clean sheets.  For general bathing Follow these steps when using CHG cloths for general bathing (unless your health care provider gives you different instructions). Use a separate CHG cloth for each area of your body. Make sure you wash between any folds of skin and between your fingers and toes. Wash your body in the following order, switching to a new cloth after each step: The front of your neck, shoulders, and chest. Both of your arms, under your arms, and your hands. Your stomach and groin area, avoiding the genitals. Your right leg and foot. Your left leg and foot. The back of your neck, your back, and your buttocks. Do not rinse. Discard the cloth and let the area air-dry. Do not put any substances on your body afterward--such as powder, lotion, or perfume--unless you are told to do so by your health care provider. Only use lotions that are recommended by the manufacturer. Put on clean clothes or pajamas. Contact a health care provider if: Your skin gets irritated after scrubbing. You have questions about using your solution or cloth. You swallow any chlorhexidine. Call your local poison control center ([phone] in the [place] Get help right away if: Your eyes itch badly, or they become very red or swollen. Your skin itches badly and is red or swollen. Your hearing changes. You have trouble seeing. You have swelling or tingling in your mouth or throat. You have trouble breathing. These symptoms may represent a serious problem that is an emergency. Do not wait to see if the symptoms will go away. Get medical help right away. Call your local emergency services (911 in the [place] Do not drive yourself to the hospital. Summary Chlorhexidine gluconate (CHG) is a germ-killing (antiseptic) solution that is used to clean the skin. Cleaning your skin with CHG may help to lower  your risk for infection. You may be given CHG to use for bathing. It may be in a bottle or in a prepackaged cloth to use on your skin. Carefully follow your health care provider's instructions and the instructions on the product label. Do not use CHG if you have a chlorhexidine allergy. Contact your health care provider if your skin gets irritated after scrubbing. This information is not intended to replace advice given to you by your health care provider. Make sure you discuss any questions you have with your health care provider. Document Revised: 02/11/2022 Document Reviewed: 12/25/2020 Elsevier Patient Education  2023 [employer].

## 2022-06-12 ENCOUNTER — Encounter (HOSPITAL_COMMUNITY)
Admission: RE | Admit: 2022-06-12 | Discharge: 2022-06-12 | Disposition: A | Discharge status: Home / Self Care | Payer: Self-pay | Source: Ambulatory Visit | Attending: General Surgery | Admitting: General Surgery

## 2022-06-12 VITALS — BP 135/74 | HR 70 | Temp 98.0°F | Resp 16 | Ht 72.0 in | Wt 135.0 lb

## 2022-06-12 DIAGNOSIS — Z01812 Encounter for preprocedural laboratory examination: Secondary | ICD-10-CM | POA: Insufficient documentation

## 2022-06-12 DIAGNOSIS — B182 Chronic viral hepatitis C: Secondary | ICD-10-CM | POA: Insufficient documentation

## 2022-06-12 LAB — CBC WITH DIFFERENTIAL/PLATELET
Abs Immature Granulocytes: 0.01 10*3/uL (ref 0.00–0.07)
Basophils Absolute: 0.1 10*3/uL (ref 0.0–0.1)
Basophils Relative: 1 %
Eosinophils Absolute: 0.5 10*3/uL (ref 0.0–0.5)
Eosinophils Relative: 10 %
HCT: 44.2 % (ref 39.0–52.0)
Hemoglobin: 15.4 g/dL (ref 13.0–17.0)
Immature Granulocytes: 0 %
Lymphocytes Relative: 29 %
Lymphs Abs: 1.5 10*3/uL (ref 0.7–4.0)
MCH: 32.8 pg (ref 26.0–34.0)
MCHC: 34.8 g/dL (ref 30.0–36.0)
MCV: 94 fL (ref 80.0–100.0)
Monocytes Absolute: 0.4 10*3/uL (ref 0.1–1.0)
Monocytes Relative: 9 %
Neutro Abs: 2.6 10*3/uL (ref 1.7–7.7)
Neutrophils Relative %: 51 %
Platelets: 224 10*3/uL (ref 150–400)
RBC: 4.7 MIL/uL (ref 4.22–5.81)
RDW: 13.1 % (ref 11.5–15.5)
WBC: 5 10*3/uL (ref 4.0–10.5)
nRBC: 0 % (ref 0.0–0.2)

## 2022-06-12 LAB — COMPREHENSIVE METABOLIC PANEL
ALT: 14 U/L (ref 0–44)
AST: 16 U/L (ref 15–41)
Albumin: 4.2 g/dL (ref 3.5–5.0)
Alkaline Phosphatase: 79 U/L (ref 38–126)
Anion gap: 7 (ref 5–15)
BUN: 11 mg/dL (ref 8–23)
CO2: 26 mmol/L (ref 22–32)
Calcium: 9.2 mg/dL (ref 8.9–10.3)
Chloride: 106 mmol/L (ref 98–111)
Creatinine, Ser: 0.81 mg/dL (ref 0.61–1.24)
GFR, Estimated: >60 mL/min (ref >60)
Glucose, Bld: 87 mg/dL (ref 70–99)
Potassium: 3.5 mmol/L (ref 3.5–5.1)
Sodium: 139 mmol/L (ref 135–145)
Total Bilirubin: 0.7 mg/dL (ref 0.3–1.2)
Total Protein: 7.7 g/dL (ref 6.5–8.1)

## 2022-06-12 LAB — PROTIME-INR
INR: 1.1 (ref 0.8–1.2)
Prothrombin Time: 14.2 seconds (ref 11.4–15.2)

## 2022-06-13 ENCOUNTER — Encounter: Payer: Self-pay | Admitting: Cardiology

## 2022-06-13 ENCOUNTER — Ambulatory Visit (INDEPENDENT_AMBULATORY_CARE_PROVIDER_SITE_OTHER): Payer: Self-pay | Admitting: Cardiology

## 2022-06-13 VITALS — BP 136/80 | HR 91 | Ht 72.0 in | Wt 138.2 lb

## 2022-06-13 DIAGNOSIS — I10 Essential (primary) hypertension: Secondary | ICD-10-CM

## 2022-06-13 DIAGNOSIS — R079 Chest pain, unspecified: Secondary | ICD-10-CM

## 2022-06-13 DIAGNOSIS — E782 Mixed hyperlipidemia: Secondary | ICD-10-CM

## 2022-06-13 MED ORDER — AMLODIPINE BESYLATE 2.5 MG PO TABS: 2.5000 mg | ORAL_TABLET | Freq: Every day | ORAL | 6 refills | Status: DC

## 2022-06-13 NOTE — Patient Instructions (Signed)
Medication Instructions:  Begin Norvasc 2.5mg daily.  Continue all other medications.    Labwork: none  Testing/Procedures: none  Follow-Up: 6 months   Any Other Special Instructions Will Be Listed Below (If Applicable).    If you need a refill on your cardiac medications before your next appointment, please call your pharmacy.  

## 2022-06-13 NOTE — Progress Notes (Signed)
Pathology with benign skin with marked inflammation. Nothing concerning. Can you let the patient know.

## 2022-06-13 NOTE — Progress Notes (Signed)
Clinical Summary Mr. Koerber is a 61 y.o.male seen today for follow up of the following medical problems.      1. Chest pain - started 3-4 months ago. "Funny feeling" left chest and radiates into arm and hand. Tends to occur with activity. Can cause some diaphoresis and nausea. + SOB. Can be worst with deep breaths. No relation to food. Lasts about 10-20 minutes. Occurs 3 times a week. - notes some generalized fatigue. Notes some DOE with walking uphill - no LE edema, no orthopna   CAD risk factors: tobacco, sister with "open heart surgeries" and stents.     Jan 2021 nuclear stress: inferior/inferoseptal infarct, no current ischemia. Low risk study   - no recent chest pains - heavy exertion at work without symptoms      2.Hyperlipiddemia  - 12/2021 TC 136 TG 71 hDL 35 LDL 87 - compliant with statin    SH 2 kids 10 and 15. 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 3 each 0   amoxicillin-clavulanate (AUGMENTIN) 875-125 MG tablet Take 1 tablet by mouth 2 (two) times daily. (Patient not taking: Reported on 06/07/2022) 7 tablet 0   aspirin EC 81 MG tablet Take 1 tablet (81 mg total) by mouth daily. 90 tablet 3   atorvastatin (LIPITOR) 40 MG tablet TAKE 1 Tablet BY MOUTH ONCE EVERY DAY 90 tablet 0   clotrimazole-betamethasone (LOTRISONE) cream Apply 1 application. topically 2 (two) times daily. (Patient not taking: Reported on 06/07/2022) 15 g 1   fluticasone-salmeterol  (WIXELA INHUB) 100-50 MCG/ACT AEPB Inhale 1 puff into the lungs 2 (two) times daily. (Patient not taking: Reported on 06/07/2022) 1 each 0   nitroGLYCERIN (NITROSTAT) 0.4 MG SL tablet Place 1 tablet (0.4 mg total) under the tongue every 5 (five) minutes as needed for chest pain. May repeat for total 3 doses.  If still having CP, go to ER 25 tablet 3   omeprazole (PRILOSEC) 20 MG capsule TAKE 1 Capsule BY MOUTH ONCE EVERY DAY BEFORE BREAKFAST 90 capsule 0   No current facility-administered medications for this visit.     Past Surgical History:  Procedure Laterality Date   BIOPSY N/A 09/04/2015   Procedure: BIOPSY;  Surgeon: Corbin Ade, MD;  Location: [facility];  Service: Endoscopy;  Laterality: N/A;  gastric   ESOPHAGEAL DILATION N/A 09/04/2015   Procedure:  ESOPHAGEAL DILATION;  Surgeon: Corbin Ade, MD;  Location: [facility];  Service: Endoscopy;  Laterality: N/A;  maloney 56   ESOPHAGOGASTRODUODENOSCOPY (EGD) WITH PROPOFOL N/A 09/04/2015   Dr. Jena Gauss: mild erosive reflux esophagitis. H.pylori gastritis   none       Allergies  Allergen Reactions   Bee Venom Swelling    Bees and yellow Jackets   Other Rash    Poison oak      Family History  Problem Relation Age of Onset  Cirrhosis Father        deceased   Heart disease Sister        3 open heart surgeries   Heart disease Mother    Heart disease Other        grandmother   Colon cancer Neg Hx      Social History Mr. Stutz reports that he quit smoking about 39 years ago. His smoking use included cigarettes. He started smoking about 46 years ago. He has a 10.00 pack-year smoking history. He has never used smokeless tobacco. Mr. Wafer reports no history of alcohol use.   Review of Systems CONSTITUTIONAL: No weight loss, fever, chills, weakness or fatigue.  HEENT: Eyes: No visual loss, blurred vision, double vision or yellow sclerae.No hearing loss, sneezing, congestion, runny nose or sore throat.  SKIN: No rash or itching.   CARDIOVASCULAR: per hpi RESPIRATORY: No shortness of breath, cough or sputum.  GASTROINTESTINAL: No anorexia, nausea, vomiting or diarrhea. No abdominal pain or blood.  GENITOURINARY: No burning on urination, no polyuria NEUROLOGICAL: No headache, dizziness, syncope, paralysis, ataxia, numbness or tingling in the extremities. No change in bowel or bladder control.  MUSCULOSKELETAL: No muscle, back pain, joint pain or stiffness.  LYMPHATICS: No enlarged nodes. No history of splenectomy.  PSYCHIATRIC: No history of depression or anxiety.  ENDOCRINOLOGIC: No reports of sweating, cold or heat intolerance. No polyuria or polydipsia.  Marland Kitchen   Physical Examination Today's Vitals   06/13/22 1254  BP: 136/80  Pulse: 91  SpO2: 95%  Weight: 138 lb 3.2 oz (62.7 kg)  Height: 6' (1.829 m)   Body mass index is 18.74 kg/m.  Gen: resting comfortably, no acute distress HEENT: no scleral icterus, pupils equal round and reactive, no palptable cervical adenopathy,  CV: RRR, no m/r/g no jvd Resp: Clear to auscultation bilaterally GI: abdomen is soft, non-tender, non-distended, normal bowel sounds, no hepatosplenomegaly MSK: extremities are warm, no edema.  Skin: warm, no rash Neuro:  no focal deficits Psych: appropriate affect   Diagnostic Studies 06/2015 [facility] There was no ST segment deviation noted during stress.   Clinically and electrically negative for ischemia.  Very good exercise tolerance     Jan 2021 nuclear stress test There was no ST segment deviation noted during stress. 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 This is a low risk study. There is no current myocardium at jeopardy The left ventricular ejection fraction is low normal (50%).    Assessment and Plan  1. Chest pain - nuclear stress without current ischemia, possible old inferior infarct - no recent symptoms, continue to monitor   2. HTN - bp's consistently elevated, will start  norvasc 2.5mg  daily. Discussed not starting until a few days after his hernia surgery tomorrow  3.Hyperlipidemia - LDL at goal, continue current meds  Fine to proceed with hernia surgery from cardiac standpoint.     Antoine Poche, M.D.

## 2022-06-14 ENCOUNTER — Encounter (HOSPITAL_COMMUNITY)
Admission: RE | Disposition: A | Discharge status: Home / Self Care | Payer: Self-pay | Source: Home / Self Care | Attending: General Surgery

## 2022-06-14 ENCOUNTER — Ambulatory Visit (HOSPITAL_COMMUNITY)
Admission: RE | Admit: 2022-06-14 | Discharge: 2022-06-14 | Disposition: A | Discharge status: Home / Self Care | Payer: Self-pay | Attending: General Surgery | Admitting: General Surgery

## 2022-06-14 ENCOUNTER — Encounter (HOSPITAL_COMMUNITY): Payer: Self-pay | Admitting: General Surgery

## 2022-06-14 ENCOUNTER — Ambulatory Visit (HOSPITAL_BASED_OUTPATIENT_CLINIC_OR_DEPARTMENT_OTHER): Payer: Self-pay | Admitting: Anesthesiology

## 2022-06-14 ENCOUNTER — Ambulatory Visit (HOSPITAL_COMMUNITY): Payer: Self-pay | Admitting: Anesthesiology

## 2022-06-14 DIAGNOSIS — K219 Gastro-esophageal reflux disease without esophagitis: Secondary | ICD-10-CM | POA: Insufficient documentation

## 2022-06-14 DIAGNOSIS — G709 Myoneural disorder, unspecified: Secondary | ICD-10-CM | POA: Insufficient documentation

## 2022-06-14 DIAGNOSIS — K4090 Unilateral inguinal hernia, without obstruction or gangrene, not specified as recurrent: Secondary | ICD-10-CM | POA: Insufficient documentation

## 2022-06-14 DIAGNOSIS — Z7951 Long term (current) use of inhaled steroids: Secondary | ICD-10-CM | POA: Insufficient documentation

## 2022-06-14 DIAGNOSIS — F1290 Cannabis use, unspecified, uncomplicated: Secondary | ICD-10-CM | POA: Insufficient documentation

## 2022-06-14 DIAGNOSIS — J449 Chronic obstructive pulmonary disease, unspecified: Secondary | ICD-10-CM | POA: Insufficient documentation

## 2022-06-14 DIAGNOSIS — B182 Chronic viral hepatitis C: Secondary | ICD-10-CM | POA: Insufficient documentation

## 2022-06-14 DIAGNOSIS — Z87891 Personal history of nicotine dependence: Secondary | ICD-10-CM | POA: Insufficient documentation

## 2022-06-14 HISTORY — PX: INGUINAL HERNIA REPAIR: SHX194

## 2022-06-14 SURGERY — REPAIR, HERNIA, INGUINAL, ADULT
Anesthesia: General | Site: Groin | Laterality: Right

## 2022-06-14 MED ORDER — CHLORHEXIDINE GLUCONATE 0.12 % MT SOLN
15.0000 mL | Freq: Once | OROMUCOSAL | Status: AC
  Administered 2022-06-14: 15 mL via OROMUCOSAL

## 2022-06-14 MED ORDER — FENTANYL CITRATE (PF) 100 MCG/2ML IJ SOLN
INTRAMUSCULAR | Status: AC
  Filled 2022-06-14: qty 2

## 2022-06-14 MED ORDER — LACTATED RINGERS IV SOLN
INTRAVENOUS | Status: DC
  Administered 2022-06-14: 1000 mL via INTRAVENOUS

## 2022-06-14 MED ORDER — CHLORHEXIDINE GLUCONATE CLOTH 2 % EX PADS: 6.0000 | MEDICATED_PAD | Freq: Once | CUTANEOUS | Status: DC

## 2022-06-14 MED ORDER — ONDANSETRON HCL 4 MG PO TABS: 4.0000 mg | ORAL_TABLET | Freq: Three times a day (TID) | ORAL | 1 refills | Status: DC | PRN

## 2022-06-14 MED ORDER — SODIUM CHLORIDE 0.9 % IR SOLN
Status: DC | PRN
  Administered 2022-06-14: 1

## 2022-06-14 MED ORDER — HYDROMORPHONE HCL 1 MG/ML IJ SOLN: 0.2500 mg | INTRAMUSCULAR | Status: DC | PRN

## 2022-06-14 MED ORDER — FENTANYL CITRATE (PF) 100 MCG/2ML IJ SOLN
INTRAMUSCULAR | Status: DC | PRN
Start: 2022-06-14 — End: 2022-06-14
  Administered 2022-06-14 (×2): 50 ug via INTRAVENOUS

## 2022-06-14 MED ORDER — BUPIVACAINE HCL (300 MG DOSE) 3 X 100 MG IL IMPL
DRUG_IMPLANT | Status: AC
  Filled 2022-06-14: qty 100

## 2022-06-14 MED ORDER — BUPIVACAINE HCL (300 MG DOSE) 3 X 100 MG IL IMPL
DRUG_IMPLANT | Status: DC | PRN
  Administered 2022-06-14: 300 mg

## 2022-06-14 MED ORDER — ONDANSETRON HCL 4 MG/2ML IJ SOLN
INTRAMUSCULAR | Status: DC | PRN
  Administered 2022-06-14: 4 mg via INTRAVENOUS

## 2022-06-14 MED ORDER — LIDOCAINE HCL (CARDIAC) PF 100 MG/5ML IV SOSY
PREFILLED_SYRINGE | INTRAVENOUS | Status: DC | PRN
  Administered 2022-06-14: 60 mg via INTRAVENOUS

## 2022-06-14 MED ORDER — ONDANSETRON HCL 4 MG/2ML IJ SOLN: 4.0000 mg | Freq: Once | INTRAMUSCULAR | Status: DC | PRN

## 2022-06-14 MED ORDER — CEFAZOLIN SODIUM-DEXTROSE 2-4 GM/100ML-% IV SOLN
2.0000 g | INTRAVENOUS | Status: AC
  Administered 2022-06-14: 2 g via INTRAVENOUS
  Filled 2022-06-14: qty 100

## 2022-06-14 MED ORDER — OXYCODONE HCL 5 MG PO TABS: 5.0000 mg | ORAL_TABLET | ORAL | 0 refills | Status: DC | PRN

## 2022-06-14 MED ORDER — IPRATROPIUM-ALBUTEROL 0.5-2.5 (3) MG/3ML IN SOLN
3.0000 mL | Freq: Once | RESPIRATORY_TRACT | Status: AC
  Administered 2022-06-14: 3 mL via RESPIRATORY_TRACT

## 2022-06-14 MED ORDER — EPHEDRINE SULFATE-NACL 50-0.9 MG/10ML-% IV SOSY
PREFILLED_SYRINGE | INTRAVENOUS | Status: DC | PRN
  Administered 2022-06-14: 5 mg via INTRAVENOUS
  Administered 2022-06-14: 10 mg via INTRAVENOUS

## 2022-06-14 MED ORDER — ORAL CARE MOUTH RINSE: 15.0000 mL | Freq: Once | OROMUCOSAL | Status: AC

## 2022-06-14 MED ORDER — MEPERIDINE HCL 50 MG/ML IJ SOLN: 6.2500 mg | INTRAMUSCULAR | Status: DC | PRN

## 2022-06-14 MED ORDER — SUGAMMADEX SODIUM 200 MG/2ML IV SOLN
INTRAVENOUS | Status: DC | PRN
  Administered 2022-06-14: 150 mg via INTRAVENOUS

## 2022-06-14 MED ORDER — MIDAZOLAM HCL 5 MG/5ML IJ SOLN
INTRAMUSCULAR | Status: DC | PRN
  Administered 2022-06-14: 2 mg via INTRAVENOUS

## 2022-06-14 MED ORDER — PROPOFOL 10 MG/ML IV BOLUS
INTRAVENOUS | Status: DC | PRN
  Administered 2022-06-14: 170 mg via INTRAVENOUS

## 2022-06-14 MED ORDER — ROCURONIUM BROMIDE 10 MG/ML (PF) SYRINGE
PREFILLED_SYRINGE | INTRAVENOUS | Status: DC | PRN
  Administered 2022-06-14: 50 mg via INTRAVENOUS

## 2022-06-14 MED ORDER — MIDAZOLAM HCL 2 MG/2ML IJ SOLN
INTRAMUSCULAR | Status: AC
  Filled 2022-06-14: qty 2

## 2022-06-14 MED ORDER — DEXAMETHASONE SODIUM PHOSPHATE 4 MG/ML IJ SOLN
INTRAMUSCULAR | Status: DC | PRN
  Administered 2022-06-14: 5 mg via INTRAVENOUS

## 2022-06-14 MED ORDER — PHENYLEPHRINE 80 MCG/ML (10ML) SYRINGE FOR IV PUSH (FOR BLOOD PRESSURE SUPPORT)
PREFILLED_SYRINGE | INTRAVENOUS | Status: DC | PRN
  Administered 2022-06-14: 80 ug via INTRAVENOUS
  Administered 2022-06-14 (×3): 160 ug via INTRAVENOUS
  Administered 2022-06-14: 80 ug via INTRAVENOUS
  Administered 2022-06-14: 160 ug via INTRAVENOUS

## 2022-06-14 SURGICAL SUPPLY — 38 items
ADH SKN CLS APL DERMABOND .7 (GAUZE/BANDAGES/DRESSINGS) ×1
CLOTH BEACON ORANGE TIMEOUT ST (SAFETY) ×1 IMPLANT
COVER LIGHT HANDLE STERIS (MISCELLANEOUS) ×2 IMPLANT
DERMABOND ADVANCED (GAUZE/BANDAGES/DRESSINGS) ×1
DERMABOND ADVANCED .7 DNX12 (GAUZE/BANDAGES/DRESSINGS) ×1 IMPLANT
DRAIN PENROSE 0.5X18 (DRAIN) ×1 IMPLANT
ELECT REM PT RETURN 9FT ADLT (ELECTROSURGICAL) ×1
ELECTRODE REM PT RTRN 9FT ADLT (ELECTROSURGICAL) ×1 IMPLANT
GAUZE SPONGE 4X4 12PLY STRL (GAUZE/BANDAGES/DRESSINGS) ×1 IMPLANT
GLOVE BIO SURGEON STRL SZ 6.5 (GLOVE) ×1 IMPLANT
GLOVE BIOGEL PI IND STRL 6.5 (GLOVE) ×1 IMPLANT
GLOVE BIOGEL PI IND STRL 7.0 (GLOVE) ×2 IMPLANT
GLOVE BIOGEL PI INDICATOR 6.5 (GLOVE) ×2
GLOVE BIOGEL PI INDICATOR 7.0 (GLOVE) ×2
GOWN STRL REUS W/TWL LRG LVL3 (GOWN DISPOSABLE) ×3 IMPLANT
INST SET MINOR GENERAL (KITS) ×1 IMPLANT
KIT TURNOVER KIT A (KITS) ×1 IMPLANT
MANIFOLD NEPTUNE II (INSTRUMENTS) ×1 IMPLANT
MESH HERNIA 1.6X1.9 PLUG LRG (Mesh General) IMPLANT
MESH HERNIA PLUG LRG (Mesh General) ×1 IMPLANT
MESH MARLEX PLUG MEDIUM (Mesh General) IMPLANT
NDL HYPO 18GX1.5 BLUNT FILL (NEEDLE) ×1 IMPLANT
NEEDLE HYPO 18GX1.5 BLUNT FILL (NEEDLE) IMPLANT
NS IRRIG 1000ML POUR BTL (IV SOLUTION) ×1 IMPLANT
PACK MINOR (CUSTOM PROCEDURE TRAY) ×1 IMPLANT
PAD ARMBOARD 7.5X6 YLW CONV (MISCELLANEOUS) ×1 IMPLANT
SET BASIN LINEN APH (SET/KITS/TRAYS/PACK) ×1 IMPLANT
SOL PREP PROV IODINE SCRUB 4OZ (MISCELLANEOUS) ×1 IMPLANT
SUT MNCRL AB 4-0 PS2 18 (SUTURE) ×1 IMPLANT
SUT NOVA NAB GS-22 2 2-0 T-19 (SUTURE) IMPLANT
SUT SILK 3 0 (SUTURE) ×1
SUT SILK 3-0 18XBRD TIE 12 (SUTURE) IMPLANT
SUT VIC AB 2-0 CT1 27 (SUTURE) ×1
SUT VIC AB 2-0 CT1 TAPERPNT 27 (SUTURE) ×1 IMPLANT
SUT VIC AB 3-0 SH 27 (SUTURE) ×1
SUT VIC AB 3-0 SH 27X BRD (SUTURE) ×1 IMPLANT
SUT VICRYL AB 3 0 TIES (SUTURE) IMPLANT
SYR 30ML LL (SYRINGE) ×1 IMPLANT

## 2022-06-14 NOTE — Discharge Instructions (Signed)
Discharge Instructions Hernia:  Common Complaints: Pain at the incision site is common. This will improve with time. Take your pain medications as described below. Some nausea is common and poor appetite. The main goal is to stay hydrated the first few days after surgery.  Numbness at the incision or the thigh is common.  If you start to have burning or tingling pain in your groin, this is from a nerve being pinched. Please call and we can prescribe you a different type of pain medication for nerve pain.   Diet/ Activity: Diet as tolerated. You may not have an appetite, but it is important to stay hydrated. Drink 64 ounces of water a day. Your appetite will return with time.  Shower per your regular routine daily.  Do not take hot showers. Take warm showers that are less than 10 minutes. Rest and listen to your body, but do not remain in bed all day.  Walk everyday for at least 15-20 minutes. Deep cough and move around every 1-2 hours in the first few days after surgery.  Do not pick at the dermabond glue on your incision sites.  This glue film will remain in place for 1-2 weeks and will start to peel off.  Do not place lotions or balms on your incision unless instructed to specifically by Dr. Constance Haw.  Do not lift > 10 lbs, perform excessive bending, pushing, pulling, squatting for 6-8 weeks after surgery.   Pain Expectations and Narcotics: -After surgery you will have pain associated with your incisions and this is normal. The pain is muscular and nerve pain, and will get better with time. -You are encouraged and expected to take non narcotic medications like tylenol and ibuprofen (when able) to treat pain as multiple modalities can aid with pain treatment. -Narcotics are only used when pain is severe or there is breakthrough pain. -You are not expected to have a pain score of 0 after surgery, as we cannot prevent pain. A pain score of 3-4 that allows you to be functional, move, walk, and  tolerate some activity is the goal. The pain will continue to improve over the days after surgery and is dependent on your surgery. -Due to [place] law, we are only able to give a certain amount of pain medication to treat post operative pain, and we only give additional narcotics on a patient by patient basis.  -For most laparoscopic surgery, studies have shown that the majority of patients only need 10-15 narcotic pills, and for open surgeries most patients only need 15-20.   -Having appropriate expectations of pain and knowledge of pain management with non narcotics is important as we do not want anyone to become addicted to narcotic pain medication.  -Using ice packs in the first 48 hours and heating pads after 48 hours, wearing an abdominal binder (when recommended), and using over the counter medications are all ways to help with pain management.   -Simple acts like meditation and mindfulness practices after surgery can also help with pain control and research has proven the benefit of these practices.  Medication: Take tylenol and ibuprofen as needed for pain control, alternating every 4-6 hours.  Example:  Tylenol 1000mg  @ 6am, 12noon, 6pm, 68midnight (Do not exceed 4000mg  of tylenol a day). Ibuprofen 800mg  @ 9am, 3pm, 9pm, 3am (Do not exceed 3600mg  of ibuprofen a day).  Take Roxicodone for breakthrough pain every 4 hours.  Take Colace for constipation related to narcotic pain medication. If you do not have a  bowel movement in 2 days, take Miralax over the counter.  Drink plenty of water to also prevent constipation.   Contact Information: If you have questions or concerns, please call our office, [phone], Monday- Thursday 8AM-5PM and Friday 8AM-12Noon.  If it is after hours or on the weekend, please call [facility] Main Number, [phone], and ask to speak to the surgeon on call for Dr. Henreitta Leber at [facility].

## 2022-06-14 NOTE — Anesthesia Postprocedure Evaluation (Signed)
Anesthesia Post Note  Patient: Edgar Roberts  Procedure(s) Performed: HERNIA REPAIR INGUINAL ADULT (Right: Groin)  Patient location during evaluation: Phase II Anesthesia Type: General Level of consciousness: awake and alert and oriented Pain management: pain level controlled Vital Signs Assessment: post-procedure vital signs reviewed and stable Respiratory status: spontaneous breathing, nonlabored ventilation and respiratory function stable Cardiovascular status: blood pressure returned to baseline and stable Postop Assessment: no apparent nausea or vomiting Anesthetic complications: no   No notable events documented.   Last Vitals:  Vitals:   06/14/22 1043 06/14/22 1045  BP: 127/84 123/67  Pulse: 72 68  Resp: 19 13  Temp:  36.6 C  SpO2: 97% 96%    Last Pain:  Vitals:   06/14/22 1045  TempSrc:   PainSc: 0-No pain                 Jaquel Glassburn C Valita Righter

## 2022-06-14 NOTE — Progress Notes (Signed)
[facility]  Patient has friend picking him up. No one to discuss surgery with. Rx sent to pharmacy. Will see him 9/14 for post op check. Can return to work 9/4 if able to follow restrictions of No heavy lifting > 10 lbs, excessive bending, pushing, pulling, or squatting.  He can follow these restrictions until 07/29/2022 (6 weeks from surgery).  Algis Greenhouse, MD [facility] [address] [place], [place] [address] [phone] (office)

## 2022-06-14 NOTE — Anesthesia Procedure Notes (Signed)
Procedure Name: Intubation Date/Time: 06/14/2022 8:54 AM  Performed by: Caren Macadam, CRNAPre-anesthesia Checklist: Patient identified, Emergency Drugs available, Suction available and Patient being monitored Patient Re-evaluated:Patient Re-evaluated prior to induction Oxygen Delivery Method: Circle system utilized Preoxygenation: Pre-oxygenation with 100% oxygen Induction Type: IV induction Ventilation: Mask ventilation without difficulty Laryngoscope Size: Miller and 2 Grade View: Grade I Tube type: Oral Tube size: 7.5 mm Number of attempts: 1 Airway Equipment and Method: Stylet and Oral airway Placement Confirmation: ETT inserted through vocal cords under direct vision, positive ETCO2 and breath sounds checked- equal and bilateral Secured at: 24 cm Tube secured with: Tape Dental Injury: Teeth and Oropharynx as per pre-operative assessment

## 2022-06-14 NOTE — Interval H&P Note (Signed)
History and Physical Interval Note:  06/14/2022 8:27 AM  Edgar Roberts  has presented today for surgery, with the diagnosis of Right inguinal hernia.  The various methods of treatment have been discussed with the patient and family. After consideration of risks, benefits and other options for treatment, the patient has consented to  Procedure(s): HERNIA REPAIR INGUINAL ADULT (Right) as a surgical intervention.  The patient's history has been reviewed, patient examined, no change in status, stable for surgery.  I have reviewed the patient's chart and labs.  Questions were answered to the patient's satisfaction.     Lucretia Roers

## 2022-06-14 NOTE — Transfer of Care (Signed)
Immediate Anesthesia Transfer of Care Note  Patient: Edgar Roberts  Procedure(s) Performed: HERNIA REPAIR INGUINAL ADULT (Right: Groin)  Patient Location: PACU  Anesthesia Type:General  Level of Consciousness: awake and alert   Airway & Oxygen Therapy: Patient Spontanous Breathing and Patient connected to nasal cannula oxygen  Post-op Assessment: Report given to RN and Post -op Vital signs reviewed and stable  Post vital signs: Reviewed and stable  Last Vitals:  Vitals Value Taken Time  BP 139/81 06/14/22 1022  Temp    Pulse 79 06/14/22 1024  Resp 16 06/14/22 1024  SpO2 98 % 06/14/22 1024  Vitals shown include unvalidated device data.  Last Pain:  Vitals:   06/14/22 0833  TempSrc: Oral  PainSc: 0-No pain      Patients Stated Pain Goal: 8 (06/14/22 7262)  Complications: No notable events documented.

## 2022-06-14 NOTE — Op Note (Signed)
[facility] Operative Note  06/14/22  Preoperative Diagnosis: Right inguinal hernia    Postoperative Diagnosis: Right inguinal hernia, direct    Procedure(s) Performed: Right inguinal hernia repair with mesh   Surgeon: Leatrice Jewels. Henreitta Leber, MD   Assistants: No qualified resident was available   Anesthesia: General endotracheal   Anesthesiologist: Dr. Alva Garnet    Specimens: None    Estimated Blood Loss: Minimal   Blood Replacement: None    Complications: None     Wound Class: Clean    Operative Indications: Mr. Anne Hahn is a 61 yo with a right inguinal hernia. We discussed repair and risk of bleeding, infection, recurrence, use of mesh and neurovascular injury or cord structure injury.   Findings: Direct hernia (medial to epigastric), small indirect   Procedure: The patient was taken to the operating room and placed supine. General endotracheal anesthesia was induced. Intravenous antibiotics were  administered per protocol.  A time out was preformed verifying the correct patient, procedure, site, positioning and implants.  The right groin and scrotum were prepared and draped in the usual sterile fashion.   An incision was made in a natural skin crease between the pubic tubercle and the anterior superior iliac spine.  The incision was deepened with electrocautery through Scarpa's and Camper's fascia until the aponeurosis of the external oblique was encountered.  This was cleaned and the external ring was exposed.  An incision was made in the midportion of the external oblique aponeurosis in the direction of its fibers. The ilioinguinal nerve was identified and was protected throughout the dissection.  Flaps of the external oblique were developed cephalad and inferiorly.    The cord was identified and it was gently dissented free at the pubic tubercle and encircled with a Penrose drain.  There was an obvious direct hernia medial to the epigastric vessels. This was  dissected out to allow for reduction into the abdomen. A large Perfix plug was placed into the defect and secured to the conjoint tendon to prevent it from moving around.  Attention was then directed at the anteromedial aspect of the cord, where an indirect hernia sac was identified.  The sac was carefully dissected free from the cord down to the level of the internal ring.  The vas and testicular vessels were identified and protected from harm.  Once the sac was dissected free from the cords, the Penrose was placed around the cord which was retracted inferiorly out of the field of view.  The hernia was reduced into the internal ring without difficulty.  A medium Perfix Plug was placed into the defect and filled the space.  Attention was then turned to the floor of the canal, which was grossly weakened without any defined defect or sac.  The Perfix Mesh Patch was sutured to the inguinal ligament inferiorly starting at the pubic tubercle using 2-0 Novafil interrupted sutures.  The mesh was sutured superiorly to the conjoint tendon using 2-0 Novafil interrupted sutures.  Care was taken to ensure the mesh was placed in a relaxed fashion to avoid excessive tension and no neurovascular structures were caught in the repair.  Laterally the tails of the mesh were crossed and the internal ring was recreated, allowing for passage of cords without tension.   Hemostasis was adequate.  Eldridge Abrahams was layered through the repair.  The Penrose was removed.  The external oblique aponeurosis was closed with a 2-0 Vicryl suture in a running fashion, taking care to not catch the ilioinguinal nerve in the  suture line.  Scarpa's fashion was closed with 3-0 Vicryl interrupted sutures. The skin was closed with a subcuticular 4-0 Monocryl suture.  Dermabond was applied.   The testis was gently pulled down into its anatomic position in the scrotum.  The patient tolerated the procedure well and was taken to the PACU in stable condition.  All counts were correct at the end of the case.        Algis Greenhouse, MD [facility] [address] [place], [place] [address] [phone] (office)

## 2022-06-14 NOTE — Anesthesia Preprocedure Evaluation (Addendum)
Anesthesia Evaluation  Patient identified by MRN, date of birth, ID band Patient awake    Reviewed: Allergy & Precautions, NPO status , Patient's Chart, lab work & pertinent test results  History of Anesthesia Complications Negative for: history of anesthetic complications  Airway Mallampati: II  TM Distance: >3 FB Neck ROM: Full    Dental  (+) Edentulous Upper, Edentulous Lower   Pulmonary shortness of breath and with exertion, asthma , COPD,  COPD inhaler, Patient abstained from smoking., former smoker,    Pulmonary exam normal breath sounds clear to auscultation       Cardiovascular Exercise Tolerance: Good + angina (took NTG month ago, cardiolgy note reviewed) with exertion Normal cardiovascular exam Rhythm:Regular Rate:Normal     Neuro/Psych PSYCHIATRIC DISORDERS  Neuromuscular disease    GI/Hepatic hiatal hernia, GERD  Medicated and Controlled,(+) Cirrhosis     substance abuse  marijuana use, Hepatitis -, C  Endo/Other    Renal/GU      Musculoskeletal   Abdominal   Peds  Hematology   Anesthesia Other Findings Jan 2021 nuclear stress test ? There was no ST segment deviation noted during stress. ? 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 ? This is a low risk study. There is no current myocardium at jeopardy ? The left ventricular ejection fraction is low normal (50%).    Assessment and Plan  1. Chest pain -nuclear stress without current ischemia, possible old inferior infarct - no recent chest pains, ongoing left arm tingling/numbness that can last for hours at a time suggesting neurologic etiology, perhaps cervical radiculopathy. Would defer to pcp to consider any form of imaging  2. Borderline HTN - manual bp 134/74, slightly above goal - discussed dietary changes including DASH diet, monitor at this time. If progresses may need to consider medical therapy.     Reproductive/Obstetrics                           Anesthesia Physical Anesthesia Plan  ASA: 2  Anesthesia Plan: General   Post-op Pain Management: Dilaudid IV   Induction: Intravenous  PONV Risk Score and Plan: 3 and Ondansetron and Dexamethasone  Airway Management Planned: Oral ETT  Additional Equipment:   Intra-op Plan:   Post-operative Plan: Extubation in OR  Informed Consent: I have reviewed the patients History and Physical, chart, labs and discussed the procedure including the risks, benefits and alternatives for the proposed anesthesia with the patient or authorized representative who has indicated his/her understanding and acceptance.     Dental advisory given  Plan Discussed with: CRNA and Surgeon  Anesthesia Plan Comments:        Anesthesia Quick Evaluation

## 2022-06-21 ENCOUNTER — Encounter (HOSPITAL_COMMUNITY): Payer: Self-pay | Admitting: General Surgery

## 2022-06-23 IMAGING — CT CT PELVIS W/ CM
2 of 6 series · 14 of 46 positions shown, 19 images · IV contrast (Omnipaque or Isovue)
Comparison: None.

CLINICAL DATA: Perirectal abscess.  Pain.

EXAM:
CT PELVIS WITH CONTRAST
TECHNIQUE: Multidetector CT imaging of the pelvis was performed using the
standard protocol following the bolus administration of intravenous
contrast.
CONTRAST:  100mL OMNIPAQUE IOHEXOL 300 MG/ML  SOLN

[Series 2: axial st · axial · 0.72mm/px · z∈[+687,+992]mm · 11 of 71 slices shown, 16 images]
[im 5/71  soft-tissue]
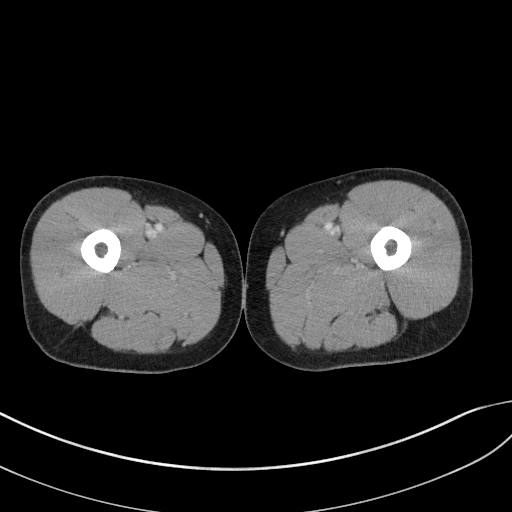
[im 5/71  bone]
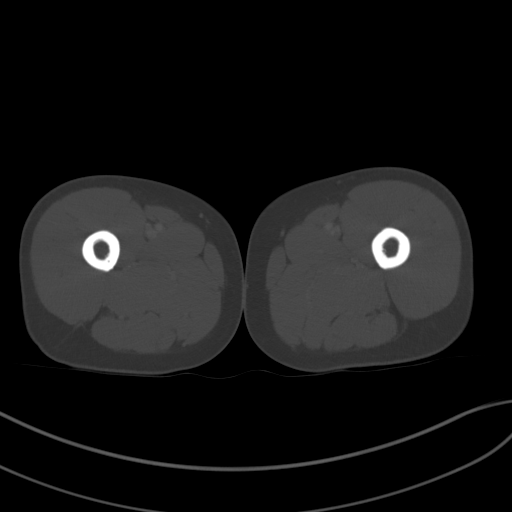
[im 15/71  soft-tissue]
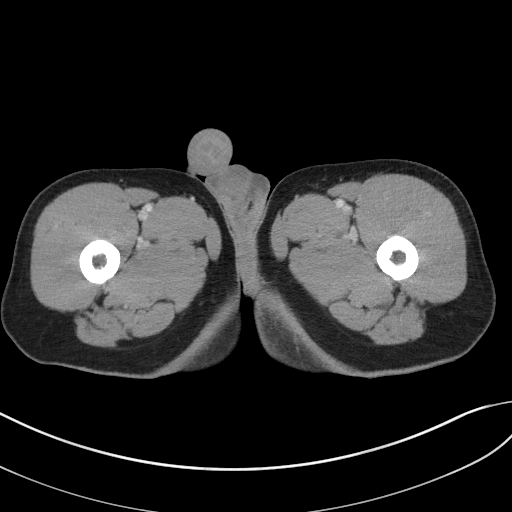
[im 19/71  soft-tissue]
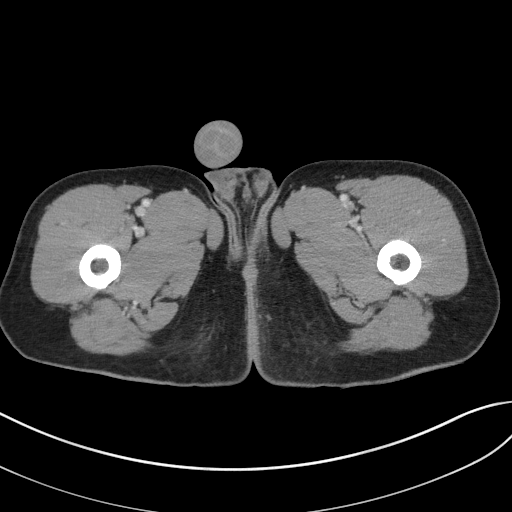
[im 24/71  soft-tissue]
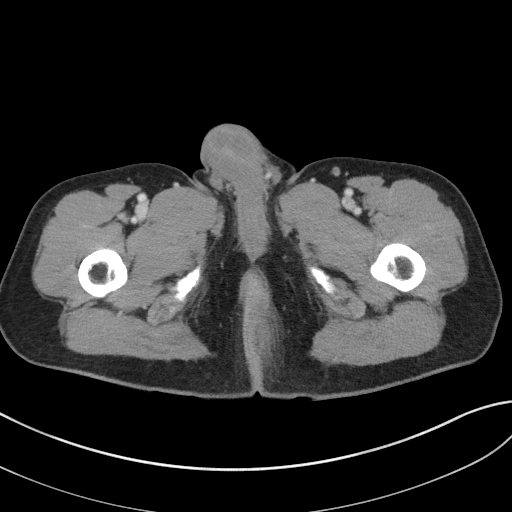
[im 33/71  soft-tissue]
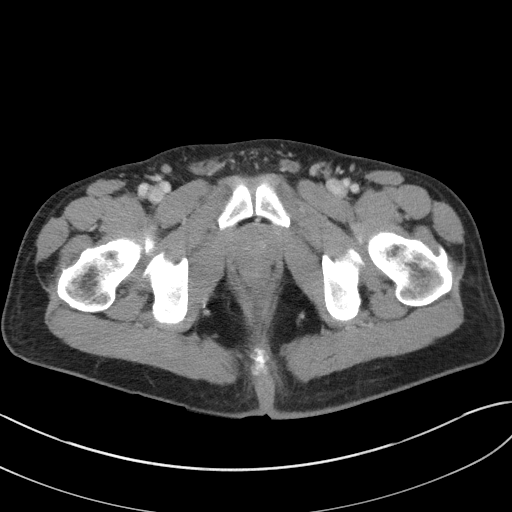
[im 38/71  soft-tissue]
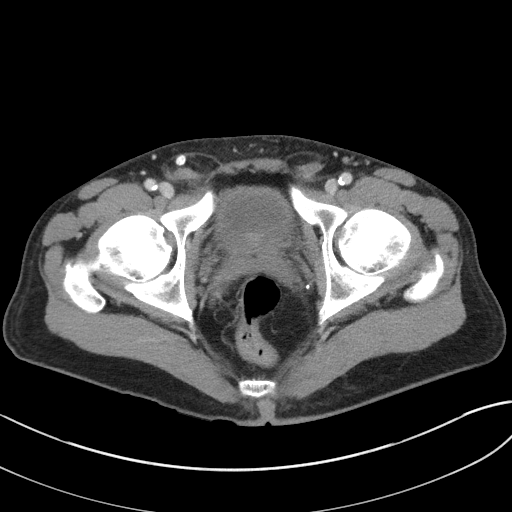
[im 47/71  soft-tissue]
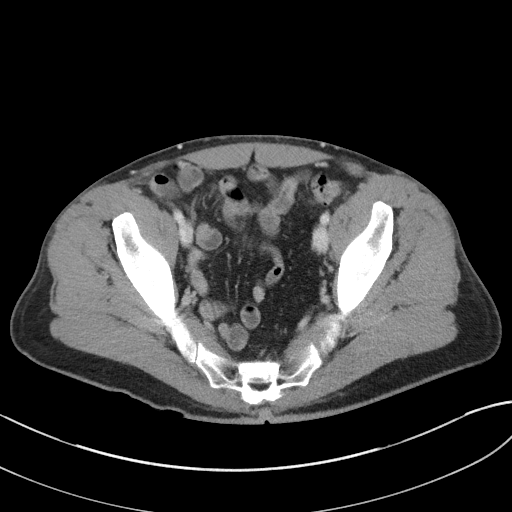
[im 52/71  soft-tissue]
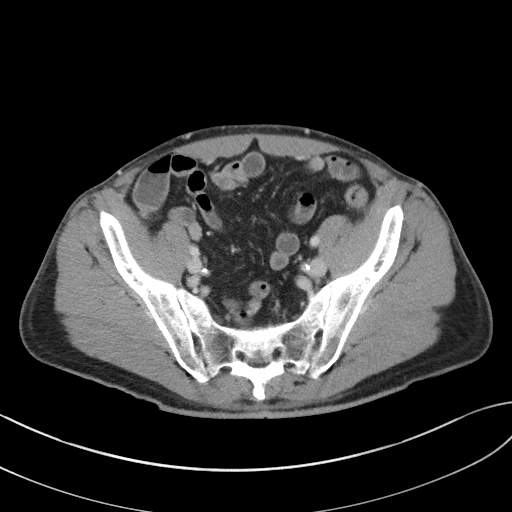
[im 52/71  lung]
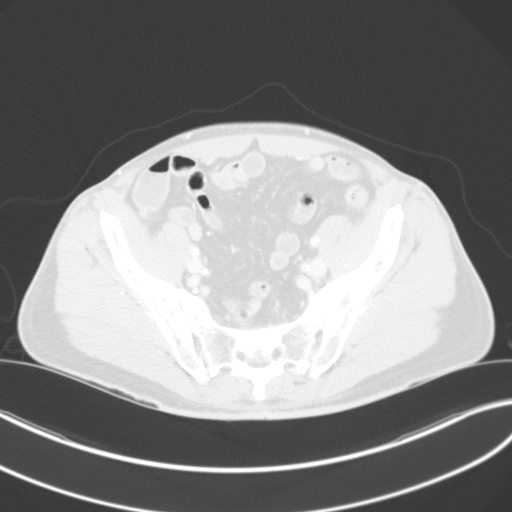
[im 57/71  soft-tissue]
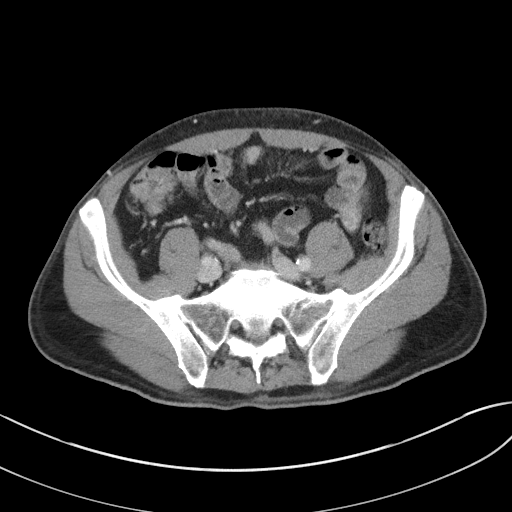
[im 57/71  lung]
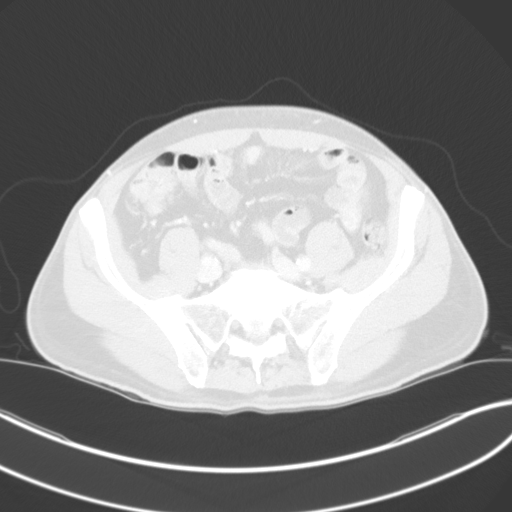
[im 57/71  bone]
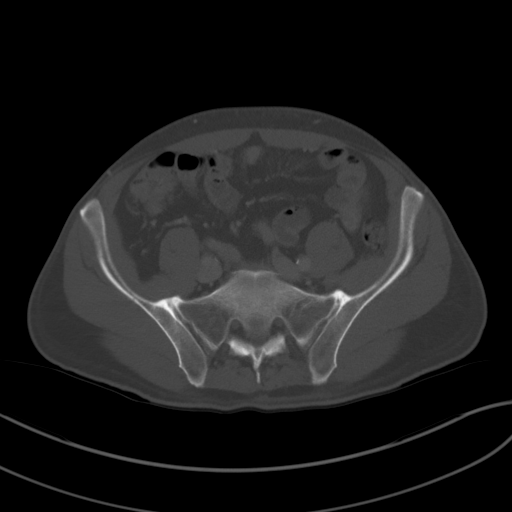
[im 61/71  lung]
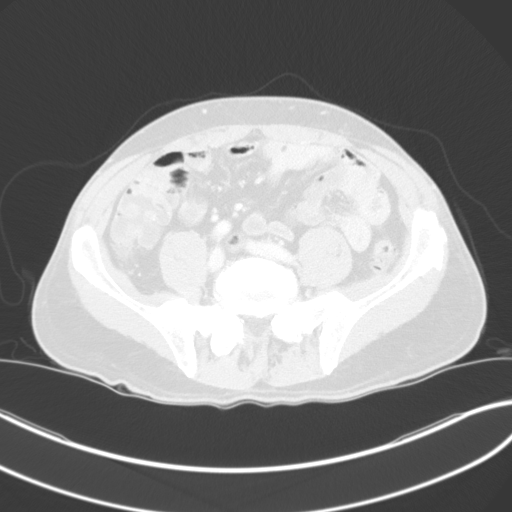
[im 66/71  soft-tissue]
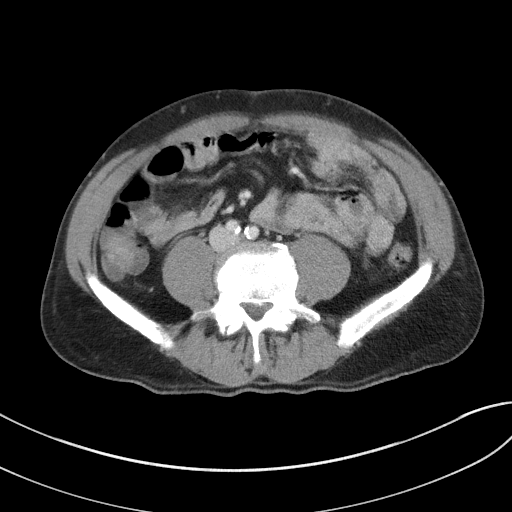
[im 66/71  lung]
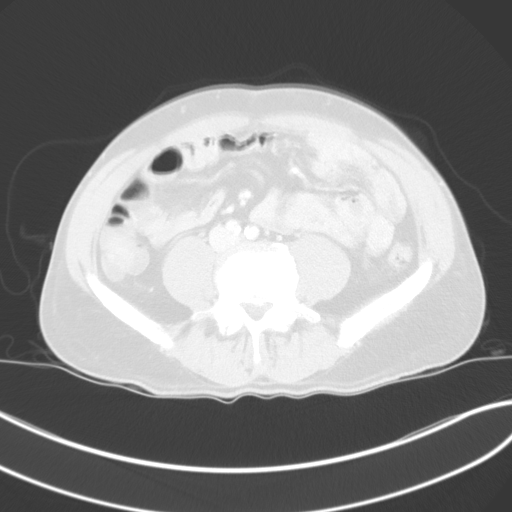

[Series 4: coronal st · coronal · 0.70mm/px · 3 of 85 slices shown]
[im 22/85  soft-tissue]
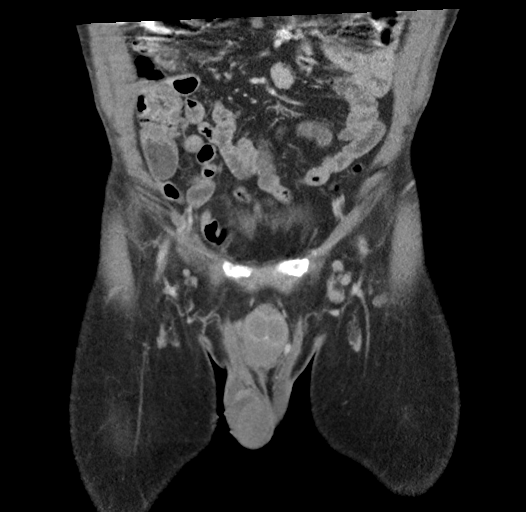
[im 43/85  soft-tissue]
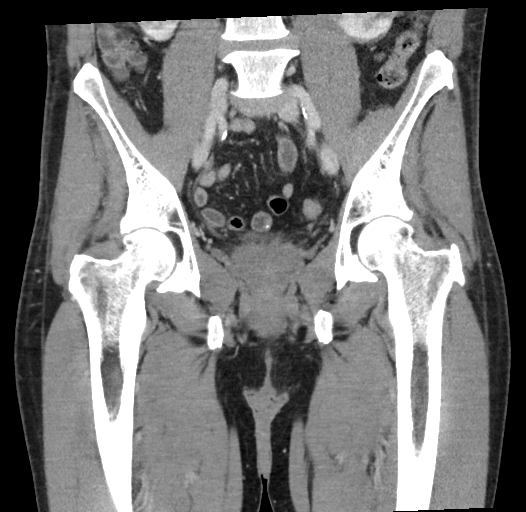
[im 64/85  soft-tissue]
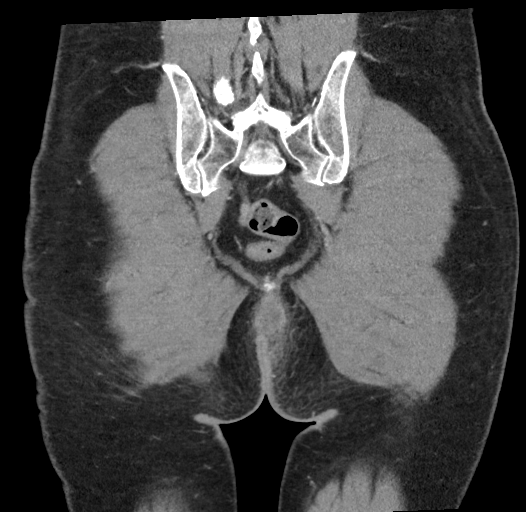

[14 of 46 positions shown; findings below may reference images not displayed]

FINDINGS: Urinary Tract: The distal ureters, urinary bladder, and urethra are
within normal limits.

Bowel:  Visualized large and small bowel are unremarkable.

Vascular/Lymphatic: Atherosclerotic calcifications are present in
the aorta and iliac vessels without aneurysm.

Reproductive: Prostate calcifications are noted. Prostate is
enlarged, measuring to 4.8 cm in transverse diameter.

Other: Fat herniates into the inguinal canals bilaterally without
associated bowel.

Left posterior perirectal mass phlegm [REDACTED] changes are present.
Ill-defined fluid collection is noted posteriorly, measuring 1.9 x
2.4 x 1.8 cm.

Musculoskeletal: No suspicious bone lesions identified.
IMPRESSION: 1. 1.9 x 2.4 x 1.8 cm left posterior perirectal abscess. This is
still somewhat ill-defined. It is unclear if this is a drainable
abscess.
2. Enlarged prostate gland.
3. Fat herniates into the inguinal canals bilaterally without
associated bowel.
4. Aortic Atherosclerosis ([id number].G).

## 2022-07-11 ENCOUNTER — Ambulatory Visit (INDEPENDENT_AMBULATORY_CARE_PROVIDER_SITE_OTHER): Payer: Self-pay | Admitting: General Surgery

## 2022-07-11 ENCOUNTER — Encounter: Payer: Self-pay | Admitting: General Surgery

## 2022-07-11 VITALS — BP 133/77 | HR 72 | Temp 98.0°F | Resp 16 | Ht 72.0 in | Wt 132.0 lb

## 2022-07-11 DIAGNOSIS — K4090 Unilateral inguinal hernia, without obstruction or gangrene, not specified as recurrent: Secondary | ICD-10-CM

## 2022-07-11 NOTE — Progress Notes (Signed)
[facility]  Doing well. Right hernia repair. Left hernia still present.  BP 133/77   Pulse 72   Temp 98 F (36.7 C) (Oral)   Resp 16   Ht 6' (1.829 m)   Wt 132 lb (59.9 kg)   SpO2 97%   BMI 17.90 kg/m  Incision on right healing, no erythema or drainage, indurated in inguinal region mild  Patient s/p R inguinal hernia repair with mesh. Has a left and is going to let us know when he is ready for repair.  No heavy lifting > 10 lbs, excessive bending, pushing, pulling, or squatting for 6-8 weeks after surgery.  PRN follow up  Algis Greenhouse, MD [facility] [address] [place], [place] [address] [phone] (office)

## 2022-07-11 NOTE — Patient Instructions (Signed)
No heavy lifting > 10 lbs, excessive bending, pushing, pulling, or squatting for 6-8 weeks after surgery.   

## 2022-08-19 ENCOUNTER — Other Ambulatory Visit: Payer: Self-pay | Admitting: Physician Assistant

## 2022-08-21 ENCOUNTER — Ambulatory Visit: Payer: Self-pay | Admitting: Physician Assistant

## 2022-08-21 ENCOUNTER — Encounter: Payer: Self-pay | Admitting: Physician Assistant

## 2022-08-21 VITALS — BP 124/64 | HR 74 | Temp 97.8°F | Wt 131.1 lb

## 2022-08-21 DIAGNOSIS — J449 Chronic obstructive pulmonary disease, unspecified: Secondary | ICD-10-CM

## 2022-08-21 DIAGNOSIS — E785 Hyperlipidemia, unspecified: Secondary | ICD-10-CM

## 2022-08-21 DIAGNOSIS — R634 Abnormal weight loss: Secondary | ICD-10-CM

## 2022-08-21 DIAGNOSIS — Z125 Encounter for screening for malignant neoplasm of prostate: Secondary | ICD-10-CM

## 2022-08-21 DIAGNOSIS — R918 Other nonspecific abnormal finding of lung field: Secondary | ICD-10-CM

## 2022-08-21 DIAGNOSIS — K219 Gastro-esophageal reflux disease without esophagitis: Secondary | ICD-10-CM

## 2022-08-21 DIAGNOSIS — R9389 Abnormal findings on diagnostic imaging of other specified body structures: Secondary | ICD-10-CM

## 2022-08-21 DIAGNOSIS — F1290 Cannabis use, unspecified, uncomplicated: Secondary | ICD-10-CM

## 2022-08-21 MED ORDER — OMEPRAZOLE 20 MG PO CPDR
DELAYED_RELEASE_CAPSULE | ORAL | 0 refills | Status: DC
Start: 2022-08-21 — End: 2023-01-30

## 2022-08-21 MED ORDER — AMLODIPINE BESYLATE 2.5 MG PO TABS: 2.5000 mg | ORAL_TABLET | Freq: Every day | ORAL | 0 refills | Status: DC

## 2022-08-21 MED ORDER — ATORVASTATIN CALCIUM 40 MG PO TABS: ORAL_TABLET | ORAL | 0 refills | Status: DC

## 2022-08-21 MED ORDER — FLUTICASONE-SALMETEROL 100-50 MCG/ACT IN AEPB
1.0000 | INHALATION_SPRAY | Freq: Two times a day (BID) | RESPIRATORY_TRACT | 0 refills | Status: DC
Start: 2022-08-21 — End: 2022-11-21

## 2022-08-21 MED ORDER — ALBUTEROL SULFATE HFA 108 (90 BASE) MCG/ACT IN AERS: 2.0000 | INHALATION_SPRAY | Freq: Four times a day (QID) | RESPIRATORY_TRACT | 0 refills | Status: DC | PRN

## 2022-08-21 NOTE — Patient Instructions (Signed)
1- get fasting labs drawn at [facility] 2- complete & submit [employer] financial assistance application 3- you will be called with appointment for CT scans

## 2022-08-21 NOTE — Progress Notes (Signed)
BP 124/64   Pulse 74   Temp 97.8 F (36.6 C)   Wt 131 lb 1.6 oz (59.5 kg)   SpO2 98%   BMI 17.78 kg/m    Subjective:    Patient ID: Edgar Roberts, male    DOB: [birth year], 61 y.o.   MRN: [id number]  HPI: Edgar Roberts is a 61 y.o. male presenting on 08/21/2022 for Weight Check   HPI   Pt needs repeat chest CT- CT done 4/19- multiple pulmonary nodules with rec to f/u non-contract chest CT at 3-6 months then another at 18-24 months.     Pt was a no-show to his appointment on 03/26/22.    Pt has been seen by cardiology to get pre-surg clearance.  He has appt to follow there in February   He doesn't smoke cigarettes but smokes MJ daily.  He has been out of both his inhalers for quite some time.   -he has been Out of atorvastatin about 3 wk  He says he is doing better with not scratching his skin.     Relevant past medical, surgical, family and social history reviewed and updated as indicated. Interim medical history since our last visit reviewed. Allergies and medications reviewed and updated.  CURRENT MEDS: Amlodipine 2.5mg     Current Outpatient Medications:    amLODipine (NORVASC) 2.5 MG tablet, Take 1 tablet (2.5 mg total) by mouth daily., Disp: 30 tablet, Rfl: 6   albuterol (VENTOLIN HFA) 108 (90 Base) MCG/ACT inhaler, Inhale 2 puffs into the lungs every 6 (six) hours as needed for wheezing or shortness of breath. (Patient not taking: Reported on 08/21/2022), Disp: 3 each, Rfl: 0   aspirin EC 81 MG tablet, Take 1 tablet (81 mg total) by mouth daily. (Patient not taking: Reported on 08/21/2022), Disp: 90 tablet, Rfl: 3   atorvastatin (LIPITOR) 40 MG tablet, TAKE 1 Tablet BY MOUTH ONCE EVERY DAY (Patient not taking: Reported on 08/21/2022), Disp: 90 tablet, Rfl: 0   clotrimazole-betamethasone (LOTRISONE) cream, Apply 1 application. topically 2 (two) times daily. (Patient not taking: Reported on 08/21/2022), Disp: 15 g, Rfl: 1   fluticasone-salmeterol (WIXELA INHUB)  100-50 MCG/ACT AEPB, Inhale 1 puff into the lungs 2 (two) times daily. (Patient not taking: Reported on 08/21/2022), Disp: 1 each, Rfl: 0   nitroGLYCERIN (NITROSTAT) 0.4 MG SL tablet, Place 1 tablet (0.4 mg total) under the tongue every 5 (five) minutes as needed for chest pain. May repeat for total 3 doses.  If still having CP, go to ER (Patient not taking: Reported on 08/21/2022), Disp: 25 tablet, Rfl: 3   omeprazole (PRILOSEC) 20 MG capsule, TAKE 1 Capsule BY MOUTH ONCE EVERY DAY BEFORE BREAKFAST (Patient not taking: Reported on 08/21/2022), Disp: 90 capsule, Rfl: 0   Review of Systems  Per HPI unless specifically indicated above     Objective:    BP 124/64   Pulse 74   Temp 97.8 F (36.6 C)   Wt 131 lb 1.6 oz (59.5 kg)   SpO2 98%   BMI 17.78 kg/m   Wt Readings from Last 3 Encounters:  08/21/22 131 lb 1.6 oz (59.5 kg)  07/11/22 132 lb (59.9 kg)  06/13/22 138 lb 3.2 oz (62.7 kg)      Weight 07/05/21- 145 lb 09/14/20- 163 lb 11/05/2017- 145 lb 12/04/2015- 147 lb 08/01/2015- 156 lb   Physical Exam Vitals reviewed.  Constitutional:      General: He is not in acute distress.    Appearance: He is underweight.  He is not toxic-appearing.  HENT:     Head: Normocephalic and atraumatic.  Cardiovascular:     Rate and Rhythm: Normal rate and regular rhythm.  Pulmonary:     Effort: Pulmonary effort is normal.     Breath sounds: Normal breath sounds. No wheezing.  Abdominal:     General: Bowel sounds are normal.     Palpations: Abdomen is soft.     Tenderness: There is no abdominal tenderness.  Musculoskeletal:     Cervical back: Neck supple.     Right lower leg: No edema.     Left lower leg: No edema.  Lymphadenopathy:     Cervical: No cervical adenopathy.  Skin:    General: Skin is warm and dry.  Neurological:     Mental Status: He is alert and oriented to person, place, and time.  Psychiatric:        Behavior: Behavior normal.           Assessment & Plan:    Encounter Diagnoses  Name Primary?   Chronic obstructive pulmonary disease, unspecified COPD type ([place]) Yes   Weight loss    Hyperlipidemia, unspecified hyperlipidemia type    Marijuana smoker    Screening for prostate cancer    Pulmonary nodules/lesions, multiple    Abnormal CT of the chest    Gastroesophageal reflux disease, unspecified whether esophagitis present       -Ct chest and abd- for weight loss -Ct chest to follow up abnormal Ct in april -If unrevealing, refer to gi -Update labs Needs psa and cmp lipids -pt counseled to Avoid running out of meds -pt is given Cafa/application for [employer] financial assistance -pt had colon cancer screening / FIT test April normal -pt scheduled to follow up 3 months

## 2022-09-06 ENCOUNTER — Other Ambulatory Visit (HOSPITAL_COMMUNITY)
Admission: RE | Admit: 2022-09-06 | Discharge: 2022-09-06 | Disposition: A | Discharge status: Home / Self Care | Payer: Self-pay | Source: Ambulatory Visit | Attending: Physician Assistant | Admitting: Physician Assistant

## 2022-09-06 DIAGNOSIS — J449 Chronic obstructive pulmonary disease, unspecified: Secondary | ICD-10-CM | POA: Insufficient documentation

## 2022-09-06 DIAGNOSIS — Z125 Encounter for screening for malignant neoplasm of prostate: Secondary | ICD-10-CM | POA: Insufficient documentation

## 2022-09-06 DIAGNOSIS — E785 Hyperlipidemia, unspecified: Secondary | ICD-10-CM | POA: Insufficient documentation

## 2022-09-06 DIAGNOSIS — R634 Abnormal weight loss: Secondary | ICD-10-CM | POA: Insufficient documentation

## 2022-09-06 LAB — LIPID PANEL
Cholesterol: 125 mg/dL (ref 0–200)
HDL: 36 mg/dL — ABNORMAL LOW (ref >40)
LDL Cholesterol: 78 mg/dL (ref 0–99)
Total CHOL/HDL Ratio: 3.5 RATIO
Triglycerides: 54 mg/dL (ref <150)
VLDL: 11 mg/dL (ref 0–40)

## 2022-09-06 LAB — COMPREHENSIVE METABOLIC PANEL
ALT: 13 U/L (ref 0–44)
AST: 17 U/L (ref 15–41)
Albumin: 4.3 g/dL (ref 3.5–5.0)
Alkaline Phosphatase: 78 U/L (ref 38–126)
Anion gap: 8 (ref 5–15)
BUN: 7 mg/dL — ABNORMAL LOW (ref 8–23)
CO2: 28 mmol/L (ref 22–32)
Calcium: 9.5 mg/dL (ref 8.9–10.3)
Chloride: 104 mmol/L (ref 98–111)
Creatinine, Ser: 0.66 mg/dL (ref 0.61–1.24)
GFR, Estimated: >60 mL/min (ref >60)
Glucose, Bld: 94 mg/dL (ref 70–99)
Potassium: 4.1 mmol/L (ref 3.5–5.1)
Sodium: 140 mmol/L (ref 135–145)
Total Bilirubin: 0.6 mg/dL (ref 0.3–1.2)
Total Protein: 7.9 g/dL (ref 6.5–8.1)

## 2022-09-06 LAB — PSA: Prostatic Specific Antigen: 0.58 ng/mL (ref 0.00–4.00)

## 2022-10-01 ENCOUNTER — Ambulatory Visit (HOSPITAL_COMMUNITY)
Admission: RE | Admit: 2022-10-01 | Discharge: 2022-10-01 | Disposition: A | Discharge status: Home / Self Care | Payer: Self-pay | Source: Ambulatory Visit | Attending: Physician Assistant | Admitting: Physician Assistant

## 2022-10-01 DIAGNOSIS — R918 Other nonspecific abnormal finding of lung field: Secondary | ICD-10-CM

## 2022-10-01 DIAGNOSIS — R9389 Abnormal findings on diagnostic imaging of other specified body structures: Secondary | ICD-10-CM

## 2022-10-01 DIAGNOSIS — R634 Abnormal weight loss: Secondary | ICD-10-CM

## 2022-10-01 MED ORDER — IOHEXOL 300 MG/ML  SOLN
75.0000 mL | Freq: Once | INTRAMUSCULAR | Status: AC | PRN
  Administered 2022-10-01: 75 mL via INTRAVENOUS

## 2022-10-23 ENCOUNTER — Other Ambulatory Visit: Payer: Self-pay | Admitting: Physician Assistant

## 2022-10-23 MED ORDER — NITROGLYCERIN 0.4 MG SL SUBL: 0.4000 mg | SUBLINGUAL_TABLET | SUBLINGUAL | 3 refills | Status: DC | PRN

## 2022-11-21 ENCOUNTER — Encounter: Payer: Self-pay | Admitting: Physician Assistant

## 2022-11-21 ENCOUNTER — Other Ambulatory Visit: Payer: Self-pay | Admitting: Physician Assistant

## 2022-11-21 ENCOUNTER — Ambulatory Visit: Payer: Self-pay | Admitting: Physician Assistant

## 2022-11-21 VITALS — BP 117/64 | HR 79 | Temp 97.2°F | Wt 133.0 lb

## 2022-11-21 DIAGNOSIS — R918 Other nonspecific abnormal finding of lung field: Secondary | ICD-10-CM

## 2022-11-21 DIAGNOSIS — E785 Hyperlipidemia, unspecified: Secondary | ICD-10-CM

## 2022-11-21 DIAGNOSIS — K029 Dental caries, unspecified: Secondary | ICD-10-CM

## 2022-11-21 DIAGNOSIS — K0889 Other specified disorders of teeth and supporting structures: Secondary | ICD-10-CM

## 2022-11-21 DIAGNOSIS — F1290 Cannabis use, unspecified, uncomplicated: Secondary | ICD-10-CM

## 2022-11-21 DIAGNOSIS — R634 Abnormal weight loss: Secondary | ICD-10-CM

## 2022-11-21 DIAGNOSIS — F424 Excoriation (skin-picking) disorder: Secondary | ICD-10-CM

## 2022-11-21 DIAGNOSIS — J449 Chronic obstructive pulmonary disease, unspecified: Secondary | ICD-10-CM

## 2022-11-21 MED ORDER — AMOXICILLIN 500 MG PO CAPS: 500.0000 mg | ORAL_CAPSULE | Freq: Three times a day (TID) | ORAL | 0 refills | Status: AC

## 2022-11-21 NOTE — Progress Notes (Signed)
BP 117/64   Pulse 79   Temp (!) 97.2 F (36.2 C)   Wt 133 lb (60.3 kg)   SpO2 97%   BMI 18.04 kg/m    Subjective:    Patient ID: Edgar Roberts, male    DOB: Jul 25, 1961, 62 y.o.   MRN: [id number]  HPI: Edgar Roberts is a 62 y.o. male presenting on 11/21/2022 for Follow-up   HPI  Chief Complaint  Patient presents with   Follow-up     Pt says his Breathing is okay.  He is still smoking MJ daily.  He uses his fluticasone daily   Pt c/o dental pain.  He had teeth break.  He says he has had No facial swelling.  Pt admits that he continues to scratch his skin vigorously to the point that it bleeds.     Relevant past medical, surgical, family and social history reviewed and updated as indicated. Interim medical history since our last visit reviewed. Allergies and medications reviewed and updated.    Current Outpatient Medications:    albuterol (VENTOLIN HFA) 108 (90 Base) MCG/ACT inhaler, INHALE 2 PUFFS BY MOUTH EVERY 6 HOURS AS NEEDED FOR COUGHING, WHEEZING, OR SHORTNESS OF BREATH, Disp: 20.1 g, Rfl: 0   amLODipine (NORVASC) 2.5 MG tablet, Take 1 tablet (2.5 mg total) by mouth daily., Disp: 90 tablet, Rfl: 0   aspirin EC 81 MG tablet, Take 1 tablet (81 mg total) by mouth daily., Disp: 90 tablet, Rfl: 3   atorvastatin (LIPITOR) 40 MG tablet, TAKE 1 Tablet BY MOUTH ONCE EVERY DAY, Disp: 90 tablet, Rfl: 0   fluticasone-salmeterol (WIXELA INHUB) 100-50 MCG/ACT AEPB, Inhale 1 puff into the lungs 2 (two) times daily., Disp: 3 each, Rfl: 0   omeprazole (PRILOSEC) 20 MG capsule, TAKE 1 Capsule BY MOUTH ONCE EVERY DAY BEFORE BREAKFAST, Disp: 90 capsule, Rfl: 0   clotrimazole-betamethasone (LOTRISONE) cream, Apply 1 application. topically 2 (two) times daily. (Patient not taking: Reported on 08/21/2022), Disp: 15 g, Rfl: 1   nitroGLYCERIN (NITROSTAT) 0.4 MG SL tablet, Place 1 tablet (0.4 mg total) under the tongue every 5 (five) minutes as needed for chest pain. May repeat for total  3 doses.  If still having CP, go to ER (Patient not taking: Reported on 11/21/2022), Disp: 25 tablet, Rfl: 3   Review of Systems  Per HPI unless specifically indicated above     Objective:    BP 117/64   Pulse 79   Temp (!) 97.2 F (36.2 C)   Wt 133 lb (60.3 kg)   SpO2 97%   BMI 18.04 kg/m   Wt Readings from Last 3 Encounters:  11/21/22 133 lb (60.3 kg)  08/21/22 131 lb 1.6 oz (59.5 kg)  07/11/22 132 lb (59.9 kg)    Physical Exam Vitals reviewed.  Constitutional:      General: He is not in acute distress.    Appearance: He is well-developed. He is ill-appearing. He is not toxic-appearing.  HENT:     Head: Normocephalic and atraumatic.     Mouth/Throat:     Dentition: Abnormal dentition. Dental tenderness and dental caries present. No dental abscesses.     Comments: Only a few teeth remaining in mouth.  Most and dark colored with significant decay Cardiovascular:     Rate and Rhythm: Normal rate and regular rhythm.  Pulmonary:     Effort: Pulmonary effort is normal.     Breath sounds: Normal breath sounds. No wheezing.  Abdominal:     General:  Bowel sounds are normal.     Palpations: Abdomen is soft.     Tenderness: There is no abdominal tenderness.  Musculoskeletal:     Cervical back: Neck supple.     Right lower leg: No edema.     Left lower leg: No edema.  Lymphadenopathy:     Cervical: No cervical adenopathy.  Skin:    General: Skin is warm and dry.     Comments: Multiple areas of excoriation.  No secondary infection seen.   Neurological:     Mental Status: He is alert and oriented to person, place, and time.  Psychiatric:        Attention and Perception: Attention normal.        Behavior: Behavior normal. Behavior is cooperative.           Assessment & Plan:    Encounter Diagnoses  Name Primary?   Chronic obstructive pulmonary disease, unspecified COPD type (Gray) Yes   Weight loss    Hyperlipidemia, unspecified hyperlipidemia type    Marijuana  smoker    Pulmonary nodules/lesions, multiple    Dentalgia    Dental decay    Picking own skin       -Labs not due before mid-february -He has appt with cardiology in february -pt is due in December 2024 for f/u chest CT -Recommended GI referral due to weight loss.  He declines to go to GI at this time -rx Amoxil for his teeth and referral to dentist -pt will follow up 2 months.   will update labs before appt.  He is to contact office sooner prn

## 2022-12-19 ENCOUNTER — Ambulatory Visit: Payer: Self-pay | Attending: Cardiology | Admitting: Cardiology

## 2022-12-19 NOTE — Progress Notes (Deleted)
Clinical Summary Edgar Roberts is a 62 y.o.male seen today for follow up of the following medical problems.      1. Chest pain - started 3-4 months ago. "Funny feeling" left chest and radiates into arm and hand. Tends to occur with activity. Can cause some diaphoresis and nausea. + SOB. Can be worst with deep breaths. No relation to food. Lasts about 10-20 minutes. Occurs 3 times a week. - notes some generalized fatigue. Notes some DOE with walking uphill - no LE edema, no orthopna   CAD risk factors: tobacco, sister with "open heart surgeries" and stents.     Jan 2021 nuclear stress: inferior/inferoseptal infarct, no current ischemia. Low risk study   - no recent chest pains - heavy exertion at work without symptoms       2.Hyperlipiddemia  - 12/2021 TC 136 TG 71 hDL 35 LDL 87 - compliant with statin    SH 2 kids 10 and 15. Works [employer], [employer] for an apt complex Past Medical History:  Diagnosis Date   Asthma    Chronic hepatitis C (HCC)    COPD (chronic obstructive pulmonary disease) (HCC)    GERD (gastroesophageal reflux disease)      Allergies  Allergen Reactions   Bee Venom Swelling    Bees and yellow Jackets   Other Rash    Poison oak     Current Outpatient Medications  Medication Sig Dispense Refill   albuterol (VENTOLIN HFA) 108 (90 Base) MCG/ACT inhaler INHALE 2 PUFFS BY MOUTH EVERY 6 HOURS AS NEEDED FOR COUGHING, WHEEZING, OR SHORTNESS OF BREATH 20.1 g 0   amLODipine (NORVASC) 2.5 MG tablet TAKE 1 Tablet BY MOUTH ONCE EVERY DAY 90 tablet 0   aspirin EC 81 MG tablet Take 1 tablet (81 mg total) by mouth daily. 90 tablet 3   atorvastatin (LIPITOR) 40 MG tablet TAKE 1 Tablet BY MOUTH ONCE EVERY DAY 90 tablet 0   clotrimazole-betamethasone (LOTRISONE) cream Apply 1 application. topically 2 (two) times daily. (Patient not taking: Reported on 08/21/2022) 15 g 1   fluticasone-salmeterol (ADVAIR) 100-50 MCG/ACT AEPB INHALE 1 PUFF BY MOUTH TWICE DAILY (EVERY 12  HOURS). RINSE MOUTH AFTER USE. 180 each 0   nitroGLYCERIN (NITROSTAT) 0.4 MG SL tablet Place 1 tablet (0.4 mg total) under the tongue every 5 (five) minutes as needed for chest pain. May repeat for total 3 doses.  If still having CP, go to ER (Patient not taking: Reported on 11/21/2022) 25 tablet 3   omeprazole (PRILOSEC) 20 MG capsule TAKE 1 Capsule BY MOUTH ONCE EVERY DAY BEFORE BREAKFAST 90 capsule 0   No current facility-administered medications for this visit.     Past Surgical History:  Procedure Laterality Date   BIOPSY N/A 09/04/2015   Procedure: BIOPSY;  Surgeon: Daneil Dolin, MD;  Location: [facility];  Service: Endoscopy;  Laterality: N/A;  gastric   ESOPHAGEAL DILATION N/A 09/04/2015   Procedure:  ESOPHAGEAL DILATION;  Surgeon: Daneil Dolin, MD;  Location: [facility];  Service: Endoscopy;  Laterality: N/A;  maloney 56   ESOPHAGOGASTRODUODENOSCOPY (EGD) WITH PROPOFOL N/A 09/04/2015   Dr. Gala Romney: mild erosive reflux esophagitis. H.pylori gastritis   INGUINAL HERNIA REPAIR Right 06/14/2022   Procedure: HERNIA REPAIR INGUINAL ADULT;  Surgeon: Virl Cagey, MD;  Location: [facility];  Service: General;  Laterality: Right;   none       Allergies  Allergen Reactions   Bee Venom Swelling    Bees and yellow Jackets  Other Rash    Poison oak      Family History  Problem Relation Age of Onset   Cirrhosis Father        deceased   Heart disease Sister        3 open heart surgeries   Heart disease Mother    Heart disease Other        grandmother   Colon cancer Neg Hx      Social History Edgar Roberts reports that he quit smoking about 40 years ago. His smoking use included cigarettes. He started smoking about 46 years ago. He has a 10.00 pack-year smoking history. He has never used smokeless tobacco. Edgar Roberts reports no history of alcohol use.   Review of Systems CONSTITUTIONAL: No weight loss, fever, chills, weakness or fatigue.  HEENT: Eyes: No visual loss, blurred vision,  double vision or yellow sclerae.No hearing loss, sneezing, congestion, runny nose or sore throat.  SKIN: No rash or itching.  CARDIOVASCULAR:  RESPIRATORY: No shortness of breath, cough or sputum.  GASTROINTESTINAL: No anorexia, nausea, vomiting or diarrhea. No abdominal pain or blood.  GENITOURINARY: No burning on urination, no polyuria NEUROLOGICAL: No headache, dizziness, syncope, paralysis, ataxia, numbness or tingling in the extremities. No change in bowel or bladder control.  MUSCULOSKELETAL: No muscle, back pain, joint pain or stiffness.  LYMPHATICS: No enlarged nodes. No history of splenectomy.  PSYCHIATRIC: No history of depression or anxiety.  ENDOCRINOLOGIC: No reports of sweating, cold or heat intolerance. No polyuria or polydipsia.  Marland Kitchen   Physical Examination There were no vitals filed for this visit. There were no vitals filed for this visit.  Gen: resting comfortably, no acute distress HEENT: no scleral icterus, pupils equal round and reactive, no palptable cervical adenopathy,  CV Resp: Clear to auscultation bilaterally GI: abdomen is soft, non-tender, non-distended, normal bowel sounds, no hepatosplenomegaly MSK: extremities are warm, no edema.  Skin: warm, no rash Neuro:  no focal deficits Psych: appropriate affect   Diagnostic Studies  06/2015 GXT There was no ST segment deviation noted during stress.   Clinically and electrically negative for ischemia.  Very good exercise tolerance     Jan 2021 nuclear stress test There was no ST segment deviation noted during stress. 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 This is a low risk study. There is no current myocardium at jeopardy The left ventricular ejection fraction is low normal (50%).   Assessment and Plan   1. Chest pain - nuclear stress without current ischemia, possible old inferior infarct - no recent symptoms, continue to monitor   2. HTN - bp's consistently  elevated, will start norvasc 2.38m daily. Discussed not starting until a few days after his hernia surgery tomorrow   3.Hyperlipidemia - LDL at goal, continue current meds   Fine to proceed with hernia surgery from cardiac standpoint.      JArnoldo Lenis M.D., F.A.C.C.

## 2023-01-08 ENCOUNTER — Ambulatory Visit: Payer: Self-pay | Attending: Cardiology | Admitting: Cardiology

## 2023-01-08 ENCOUNTER — Encounter: Payer: Self-pay | Admitting: Cardiology

## 2023-01-08 VITALS — BP 122/62 | HR 88 | Ht 72.0 in | Wt 136.8 lb

## 2023-01-08 DIAGNOSIS — E782 Mixed hyperlipidemia: Secondary | ICD-10-CM

## 2023-01-08 DIAGNOSIS — R0789 Other chest pain: Secondary | ICD-10-CM

## 2023-01-08 DIAGNOSIS — I10 Essential (primary) hypertension: Secondary | ICD-10-CM

## 2023-01-08 NOTE — Patient Instructions (Addendum)

## 2023-01-08 NOTE — Progress Notes (Signed)
Clinical Summary Mr. Wolsey is a 62 y.o.male seen today for follow up of the following medical problems.      1. Chest pain - started 3-4 months ago. "Funny feeling" left chest and radiates into arm and hand. Tends to occur with activity. Can cause some diaphoresis and nausea. + SOB. Can be worst with deep breaths. No relation to food. Lasts about 10-20 minutes. Occurs 3 times a week. - notes some generalized fatigue. Notes some DOE with walking uphill - no LE edema, no orthopna   CAD risk factors: tobacco, sister with "open heart surgeries" and stents.     Jan 2021 nuclear stress: inferior/inferoseptal infarct, no current ischemia. Low risk study   - no chest pains.  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        2.Hyperlipiddemia  - 12/2021 TC 136 TG 71 hDL 35 LDL 87 - 08/2022 TC 125 TG 54 HDL 36 LDL 78 - compliant with statin  3. 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 Past Medical History:  Diagnosis Date   Asthma    Chronic hepatitis C (HCC)    COPD (chronic obstructive pulmonary disease) (HCC)    GERD (gastroesophageal reflux disease)      Allergies  Allergen Reactions   Bee Venom Swelling    Bees and yellow Jackets   Other Rash    Poison oak     Current Outpatient Medications  Medication Sig Dispense Refill   albuterol (VENTOLIN HFA) 108 (90 Base) MCG/ACT inhaler INHALE 2 PUFFS BY MOUTH EVERY 6 HOURS AS NEEDED FOR COUGHING, WHEEZING, OR SHORTNESS OF BREATH 20.1 g 0   amLODipine (NORVASC) 2.5 MG tablet TAKE 1 Tablet BY MOUTH ONCE EVERY DAY 90 tablet 0   aspirin EC 81 MG tablet Take 1 tablet (81 mg total) by mouth daily. 90 tablet 3   atorvastatin (LIPITOR) 40 MG tablet TAKE 1 Tablet BY MOUTH ONCE EVERY DAY 90 tablet 0   clotrimazole-betamethasone (LOTRISONE) cream Apply 1 application. topically 2 (two) times daily. (Patient not taking: Reported on 08/21/2022) 15 g 1   fluticasone-salmeterol (ADVAIR)  100-50 MCG/ACT AEPB INHALE 1 PUFF BY MOUTH TWICE DAILY (EVERY 12 HOURS). RINSE MOUTH AFTER USE. 180 each 0   nitroGLYCERIN (NITROSTAT) 0.4 MG SL tablet Place 1 tablet (0.4 mg total) under the tongue every 5 (five) minutes as needed for chest pain. May repeat for total 3 doses.  If still having CP, go to ER (Patient not taking: Reported on 11/21/2022) 25 tablet 3   omeprazole (PRILOSEC) 20 MG capsule TAKE 1 Capsule BY MOUTH ONCE EVERY DAY BEFORE BREAKFAST 90 capsule 0   No current facility-administered medications for this visit.     Past Surgical History:  Procedure Laterality Date   BIOPSY N/A 09/04/2015   Procedure: BIOPSY;  Surgeon: Daneil Dolin, MD;  Location: [facility];  Service: Endoscopy;  Laterality: N/A;  gastric   ESOPHAGEAL DILATION N/A 09/04/2015   Procedure:  ESOPHAGEAL DILATION;  Surgeon: Daneil Dolin, MD;  Location: [facility];  Service: Endoscopy;  Laterality: N/A;  maloney 56   ESOPHAGOGASTRODUODENOSCOPY (EGD) WITH PROPOFOL N/A 09/04/2015   Dr. Gala Romney: mild erosive reflux esophagitis. H.pylori gastritis   INGUINAL HERNIA REPAIR Right 06/14/2022   Procedure: HERNIA REPAIR INGUINAL ADULT;  Surgeon: Virl Cagey, MD;  Location: [facility];  Service: General;  Laterality: Right;   none  Allergies  Allergen Reactions   Bee Venom Swelling    Bees and yellow Jackets   Other Rash    Poison oak      Family History  Problem Relation Age of Onset   Cirrhosis Father        deceased   Heart disease Sister        3 open heart surgeries   Heart disease Mother    Heart disease Other        grandmother   Colon cancer Neg Hx      Social History Mr. Sanders reports that he quit smoking about 40 years ago. His smoking use included cigarettes. He started smoking about 46 years ago. He has a 10.00 pack-year smoking history. He has never used smokeless tobacco. Mr. Harkcom reports no history of alcohol use.   Review of Systems CONSTITUTIONAL: No weight loss, fever, chills,  weakness or fatigue.  HEENT: Eyes: No visual loss, blurred vision, double vision or yellow sclerae.No hearing loss, sneezing, congestion, runny nose or sore throat.  SKIN: No rash or itching.  CARDIOVASCULAR: per hpi RESPIRATORY: No shortness of breath, cough or sputum.  GASTROINTESTINAL: No anorexia, nausea, vomiting or diarrhea. No abdominal pain or blood.  GENITOURINARY: No burning on urination, no polyuria NEUROLOGICAL: No headache, dizziness, syncope, paralysis, ataxia, numbness or tingling in the extremities. No change in bowel or bladder control.  MUSCULOSKELETAL: No muscle, back pain, joint pain or stiffness.  LYMPHATICS: No enlarged nodes. No history of splenectomy.  PSYCHIATRIC: No history of depression or anxiety.  ENDOCRINOLOGIC: No reports of sweating, cold or heat intolerance. No polyuria or polydipsia.  Marland Kitchen   Physical Examination Today's Vitals   01/08/23 1354  BP: 122/62  Pulse: 88  SpO2: 95%  Weight: 136 lb 12.8 oz (62.1 kg)  Height: 6' (1.829 m)   Body mass index is 18.55 kg/m.  Gen: resting comfortably, no acute distress HEENT: no scleral icterus, pupils equal round and reactive, no palptable cervical adenopathy,  CV: RRR, no m/r/g, no jvd Resp: Clear to auscultation bilaterally GI: abdomen is soft, non-tender, non-distended, normal bowel sounds, no hepatosplenomegaly MSK: extremities are warm, no edema.  Skin: warm, no rash Neuro:  no focal deficits Psych: appropriate affect   Diagnostic Studies 06/2015 GXT There was no ST segment deviation noted during stress.   Clinically and electrically negative for ischemia.  Very good exercise tolerance     Jan 2021 nuclear stress test There was no ST segment deviation noted during stress. 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 This is a low risk study. There is no current myocardium at jeopardy The left ventricular ejection fraction is low normal (50%).    Assessment and  Plan   1. Chest pain - nuclear stress without current ischemia, possible old inferior infarct - no symptoms, continue to monitor   2. HTN - at goal, continue current meds   3.Hyperlipidemia - at goal, continue current meds   F/u 1 year   Arnoldo Lenis, M.D

## 2023-01-21 ENCOUNTER — Ambulatory Visit: Payer: Self-pay | Admitting: Physician Assistant

## 2023-01-30 ENCOUNTER — Other Ambulatory Visit: Payer: Self-pay | Admitting: Physician Assistant

## 2023-02-03 ENCOUNTER — Ambulatory Visit: Payer: Self-pay | Admitting: Physician Assistant

## 2023-02-03 ENCOUNTER — Encounter: Payer: Self-pay | Admitting: Physician Assistant

## 2023-02-03 VITALS — BP 113/69 | HR 87 | Temp 97.9°F | Wt 135.5 lb

## 2023-02-03 DIAGNOSIS — R634 Abnormal weight loss: Secondary | ICD-10-CM

## 2023-02-03 DIAGNOSIS — J449 Chronic obstructive pulmonary disease, unspecified: Secondary | ICD-10-CM

## 2023-02-03 DIAGNOSIS — F1290 Cannabis use, unspecified, uncomplicated: Secondary | ICD-10-CM

## 2023-02-03 DIAGNOSIS — R918 Other nonspecific abnormal finding of lung field: Secondary | ICD-10-CM

## 2023-02-03 DIAGNOSIS — E785 Hyperlipidemia, unspecified: Secondary | ICD-10-CM

## 2023-02-03 MED ORDER — OMEPRAZOLE 20 MG PO CPDR: DELAYED_RELEASE_CAPSULE | ORAL | 0 refills | Status: DC

## 2023-02-03 MED ORDER — ALBUTEROL SULFATE HFA 108 (90 BASE) MCG/ACT IN AERS: INHALATION_SPRAY | RESPIRATORY_TRACT | 0 refills | Status: DC

## 2023-02-03 MED ORDER — ATORVASTATIN CALCIUM 40 MG PO TABS: ORAL_TABLET | ORAL | 0 refills | Status: DC

## 2023-02-03 MED ORDER — FLUTICASONE-SALMETEROL 100-50 MCG/ACT IN AEPB: INHALATION_SPRAY | RESPIRATORY_TRACT | 0 refills | Status: DC

## 2023-02-03 MED ORDER — AMLODIPINE BESYLATE 2.5 MG PO TABS: ORAL_TABLET | ORAL | 0 refills | Status: DC

## 2023-02-03 NOTE — Progress Notes (Signed)
BP 113/69   Pulse 87   Temp 97.9 F (36.6 C)   Wt 135 lb 8 oz (61.5 kg)   SpO2 99%   BMI 18.38 kg/m    Subjective:    Patient ID: Edgar Roberts, male    DOB: October 30, 1960, 62 y.o.   MRN: [id number]  HPI: Edgar Roberts is a 62 y.o. male presenting on 02/03/2023 for Follow-up   HPI  Pt is 61yoM who is in today for routine follow up.  Pt says his Breathing is "real good"   He says he continues to have occasional CP.  He Saw cardiology in February and was stable from that standpoint.    He says he occasionally has some pain mid-back.  He thinks his kidney stones are why his back hurt.   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 Interim medical history since our last visit reviewed. Allergies and medications reviewed and updated.     Current Outpatient Medications:    albuterol (VENTOLIN HFA) 108 (90 Base) MCG/ACT inhaler, INHALE 2 PUFFS BY MOUTH EVERY 6 HOURS AS NEEDED FOR COUGHING, WHEEZING, OR SHORTNESS OF BREATH, Disp: 20.1 g, Rfl: 0   amLODipine (NORVASC) 2.5 MG tablet, TAKE 1 Tablet BY MOUTH ONCE EVERY DAY, Disp: 90 tablet, Rfl: 0   aspirin EC 81 MG tablet, Take 1 tablet (81 mg total) by mouth daily., Disp: 90 tablet, Rfl: 3   atorvastatin (LIPITOR) 40 MG tablet, TAKE 1 Tablet BY MOUTH ONCE EVERY DAY, Disp: 90 tablet, Rfl: 0   fluticasone-salmeterol (ADVAIR) 100-50 MCG/ACT AEPB, INHALE 1 PUFF BY MOUTH TWICE DAILY (EVERY 12 HOURS). RINSE MOUTH AFTER USE., Disp: 180 each, Rfl: 0   omeprazole (PRILOSEC) 20 MG capsule, TAKE 1 Capsule BY MOUTH ONCE EVERY DAY BEFORE BREAKFAST, Disp: 90 capsule, Rfl: 0   nitroGLYCERIN (NITROSTAT) 0.4 MG SL tablet, Place 1 tablet (0.4 mg total) under the tongue every 5 (five) minutes as needed for chest pain. May repeat for total 3 doses.  If still  having CP, go to ER, Disp: 25 tablet, Rfl: 3    Review of Systems  Per HPI unless specifically indicated above     Objective:    BP 113/69   Pulse 87   Temp 97.9 F (36.6 C)   Wt 135 lb 8 oz (61.5 kg)   SpO2 99%   BMI 18.38 kg/m   Wt Readings from Last 3 Encounters:  02/03/23 135 lb 8 oz (61.5 kg)  01/08/23 136 lb 12.8 oz (62.1 kg)  11/21/22 133 lb (60.3 kg)    Physical Exam Vitals reviewed.  Constitutional:      General: He is not in acute distress.    Appearance: He is well-developed. He is not toxic-appearing.  HENT:     Head: Normocephalic and atraumatic.  Cardiovascular:     Rate and Rhythm: Normal rate and regular rhythm.  Pulmonary:     Effort: Pulmonary effort is normal.     Breath sounds: Normal breath sounds. No wheezing.  Abdominal:     General: Bowel sounds are normal.     Palpations: Abdomen is soft. There is no mass or pulsatile mass.     Tenderness: There is no abdominal tenderness. There is no right CVA tenderness or left CVA tenderness.  Musculoskeletal:  Cervical back: Neck supple.     Thoracic back: No tenderness or bony tenderness.     Lumbar back: No tenderness or bony tenderness.     Right lower leg: No edema.     Left lower leg: No edema.  Lymphadenopathy:     Cervical: No cervical adenopathy.  Skin:    General: Skin is warm and dry.  Neurological:     Mental Status: He is alert and oriented to person, place, and time.  Psychiatric:        Behavior: Behavior normal.           Assessment & Plan:    Encounter Diagnoses  Name Primary?   Chronic obstructive pulmonary disease, unspecified COPD type Yes   Hyperlipidemia, unspecified hyperlipidemia type    Weight loss    Marijuana smoker    Pulmonary nodules/lesions, multiple       hyperlipidemia -Update fasting labs.  Pt will be called with results.  Continue atorvastatin  Weight loss -pt previously weighed 156lb at OV 03/15/2020.  Unintentional weight loss > 20 pounds.   CT chest/abd unrevealing as to cause.  Pt previously declined referral to GI but today agrees to GI referral -Pt approved for cafa- thru 03/18/23 -pt has had normal FIT testing but has never had a colonoscopy -pt was seen by GI years ago when he had treatment for hepatitis  Copd -stable.  Recommended stopping smoking MJ.  Continue advair and albuterol mdi  F/u 3 months.  He is to contact office sooner prn

## 2023-02-10 ENCOUNTER — Encounter: Payer: Self-pay | Admitting: Gastroenterology

## 2023-02-10 ENCOUNTER — Ambulatory Visit (INDEPENDENT_AMBULATORY_CARE_PROVIDER_SITE_OTHER): Payer: Self-pay | Admitting: Gastroenterology

## 2023-02-10 VITALS — BP 118/73 | HR 69 | Temp 97.9°F | Ht 72.0 in | Wt 138.2 lb

## 2023-02-10 DIAGNOSIS — K2100 Gastro-esophageal reflux disease with esophagitis, without bleeding: Secondary | ICD-10-CM

## 2023-02-10 DIAGNOSIS — R634 Abnormal weight loss: Secondary | ICD-10-CM

## 2023-02-10 DIAGNOSIS — R110 Nausea: Secondary | ICD-10-CM

## 2023-02-10 DIAGNOSIS — K219 Gastro-esophageal reflux disease without esophagitis: Secondary | ICD-10-CM

## 2023-02-10 DIAGNOSIS — R1013 Epigastric pain: Secondary | ICD-10-CM

## 2023-02-10 NOTE — Progress Notes (Signed)
GI Office Note    Referring Provider: Jacquelin Hawking, PA-C Primary Care Physician:  Jacquelin Hawking, PA-C  Primary Gastroenterologist: Roetta Sessions, MD   Chief Complaint   Chief Complaint  Patient presents with   Weight Loss     History of Present Illness   Edgar Roberts is a 62 y.o. male presenting today at the request of Jacquelin Hawking, PA-C for further evaluation of weight loss. Patient last seen in 2019.  Patient with history of chronic hepatitis C, genotype 1b.  He had F3 and F4 fibrosis scores previously.  Treated with 12 weeks of Harvoni. Also history of right upper quadrant abdominal pain off and on for 3 years, previously recommended cholecystectomy along with liver biopsy but he has postponed on numerous occasions in part due to lack of insurance.  History of adenomyomatosis of the gallbladder, questionable gallstones. Given stage III fibrosis/macro inflammatory activity grade A1 to A2 we planned hepatoma surveillance. Patient was lost to follow up.   Patient with unintentional weight loss of greater than 20 pounds. CT chest/abd/pelvis 09/2022 with stable bilateral pulmonary nodules. Nonobstructive left nephrolithiasis. Dystrophic calcificaitons in slightly enlarged prostate gland. Pancreatic atrophy. No focal liver lesions.  Patient complains of chronic postprandial epigastric pain for more than 1 year.  Limits his ability to eat.  He nibbles because of abdominal pain.  Has a lot of nausea but no vomiting.  Does not seem to matter what he eats.  Tolerates liquids.  Bowel movements regular.  No black or bloody stools.  No heartburn or dysphagia.  Patient previously declined colonoscopy.  Recently saw cardiology for evaluation of chest pain.  Since his last visit with them, he had an episode of syncope but did not seek evaluation.  He has been having dizziness when he stands from a sitting position.      EGD 2016: -mild erosive reflux esophagitis s/p  dilaiton -hiatal hernia -H. Pylori gastritis, treated with pylera, confirmed eradication 10/2017  Medications   Current Outpatient Medications  Medication Sig Dispense Refill   albuterol (VENTOLIN HFA) 108 (90 Base) MCG/ACT inhaler INHALE 2 PUFFS BY MOUTH EVERY 6 HOURS AS NEEDED FOR COUGHING, WHEEZING, OR SHORTNESS OF BREATH 3 each 0   amLODipine (NORVASC) 2.5 MG tablet TAKE 1 Tablet BY MOUTH ONCE EVERY DAY 90 tablet 0   aspirin EC 81 MG tablet Take 1 tablet (81 mg total) by mouth daily. 90 tablet 3   atorvastatin (LIPITOR) 40 MG tablet TAKE 1 Tablet BY MOUTH ONCE EVERY DAY 90 tablet 0   fluticasone-salmeterol (ADVAIR) 100-50 MCG/ACT AEPB INHALE 1 PUFF BY MOUTH TWICE DAILY (EVERY 12 HOURS). RINSE MOUTH AFTER USE. 180 each 0   nitroGLYCERIN (NITROSTAT) 0.4 MG SL tablet Place 1 tablet (0.4 mg total) under the tongue every 5 (five) minutes as needed for chest pain. May repeat for total 3 doses.  If still having CP, go to ER 25 tablet 3   omeprazole (PRILOSEC) 20 MG capsule TAKE 1 Capsule BY MOUTH ONCE EVERY DAY BEFORE BREAKFAST 90 capsule 0   No current facility-administered medications for this visit.    Allergies   Allergies as of 02/10/2023 - Review Complete 02/10/2023  Allergen Reaction Noted   Bee venom Swelling 06/06/2022   Other Rash 06/06/2022    Past Medical History   Past Medical History:  Diagnosis Date   Asthma    Chronic hepatitis C    COPD (chronic obstructive pulmonary disease)    GERD (gastroesophageal reflux disease)  Past Surgical History   Past Surgical History:  Procedure Laterality Date   BIOPSY N/A 09/04/2015   Procedure: BIOPSY;  Surgeon: Corbin Ade, MD;  Location: [facility];  Service: Endoscopy;  Laterality: N/A;  gastric   ESOPHAGEAL DILATION N/A 09/04/2015   Procedure:  ESOPHAGEAL DILATION;  Surgeon: Corbin Ade, MD;  Location: [facility];  Service: Endoscopy;  Laterality: N/A;  maloney 56   ESOPHAGOGASTRODUODENOSCOPY (EGD) WITH PROPOFOL N/A  09/04/2015   Dr. Jena Gauss: mild erosive reflux esophagitis. H.pylori gastritis   INGUINAL HERNIA REPAIR Right 06/14/2022   Procedure: HERNIA REPAIR INGUINAL ADULT;  Surgeon: Lucretia Roers, MD;  Location: [facility];  Service: General;  Laterality: Right;   none      Past Family History   Family History  Problem Relation Age of Onset   Cirrhosis Father        deceased   Heart disease Sister        3 open heart surgeries   Heart disease Mother    Heart disease Other        grandmother   Colon cancer Neg Hx     Past Social History   Social History   Socioeconomic History   Marital status: Divorced    Spouse name: Not on file   Number of children: Not on file   Years of education: Not on file   Highest education level: Not on file  Occupational History   Not on file  Tobacco Use   Smoking status: Former    Packs/day: 2.00    Years: 5.00    Additional pack years: 0.00    Total pack years: 10.00    Types: Cigarettes    Start date: 06/06/1976    Quit date: 10/28/1982    Years since quitting: 40.3   Smokeless tobacco: Never   Tobacco comments:    only smokes marijuana  Vaping Use   Vaping Use: Never used  Substance and Sexual Activity   Alcohol use: No    Alcohol/week: 0.0 standard drinks of alcohol    Comment: quit 01/2014   Drug use: Yes    Types: Marijuana    Comment: everyday   Sexual activity: Yes    Birth control/protection: None  Other Topics Concern   Not on file  Social History Narrative   Not on file   Social Determinants of Health   Financial Resource Strain: Not on file  Food Insecurity: Not on file  Transportation Needs: Not on file  Physical Activity: Not on file  Stress: Not on file  Social Connections: Not on file  Intimate Partner Violence: Not on file    Review of Systems   General: Negative for anorexia, fever, chills, fatigue, weakness. See hpi.  Eyes: Negative for vision changes.  ENT: Negative for hoarseness, difficulty swallowing ,  nasal congestion. CV: Negative for chest pain, angina, palpitations, dyspnea on exertion, peripheral edema.  Respiratory: Negative for dyspnea at rest, dyspnea on exertion, cough, sputum, wheezing.  GI: See history of present illness. GU:  Negative for dysuria, hematuria, urinary incontinence, urinary frequency, nocturnal urination.  MS: Negative for joint pain, low back pain.  Derm: Negative for rash or itching.  Neuro: Negative for weakness, abnormal sensation, seizure, frequent headaches, memory loss,  confusion.  Psych: Negative for anxiety, depression, suicidal ideation, hallucinations.  Endo: Negative for unusual weight change.  Heme: Negative for bruising or bleeding. Allergy: Negative for rash or hives.  Physical Exam   BP 118/73 (BP Location: Right  Arm, Patient Position: Sitting, Cuff Size: Normal)   Pulse 69   Temp 97.9 F (36.6 C) (Oral)   Ht 6' (1.829 m)   Wt 138 lb 3.2 oz (62.7 kg)   SpO2 97%   BMI 18.74 kg/m    General: Well-nourished, well-developed in no acute distress.  Head: Normocephalic, atraumatic.   Eyes: Conjunctiva pink, no icterus. Mouth: Oropharyngeal mucosa moist and pink   Neck: Supple without thyromegaly, masses, or lymphadenopathy.  Lungs: Clear to auscultation bilaterally.  Heart: Regular rate and rhythm, no murmurs rubs or gallops.  Abdomen: Bowel sounds are normal,  nondistended, no hepatosplenomegaly or masses,  no abdominal bruits or hernia, no rebound or guarding.  Mild epigastric tenderness Rectal: not performed Extremities: No lower extremity edema. No clubbing or deformities.  Neuro: Alert and oriented x 4 , grossly normal neurologically.  Skin: Warm and dry, no rash or jaundice.   Psych: Alert and cooperative, normal mood and affect.  Labs   Lab Results  Component Value Date   CREATININE 0.66 09/06/2022   BUN 7 (L) 09/06/2022   NA 140 09/06/2022   K 4.1 09/06/2022   CL 104 09/06/2022   CO2 28 09/06/2022   Lab Results   Component Value Date   ALT 13 09/06/2022   AST 17 09/06/2022   ALKPHOS 78 09/06/2022   BILITOT 0.6 09/06/2022   Lab Results  Component Value Date   WBC 5.0 06/12/2022   HGB 15.4 06/12/2022   HCT 44.2 06/12/2022   MCV 94.0 06/12/2022   PLT 224 06/12/2022   Lab Results  Component Value Date   INR 1.1 06/12/2022   INR 0.99 11/20/2017   INR 0.97 11/06/2017    Imaging Studies   No results found.  Assessment   History of chronic hepatitis C: Negative HCVRNA posttreatment but he did not complete labs 12 weeks posttreatment.  Due to stage III fibrosis on FibroSure, had plans for hepatoma surveillance but patient lost to follow-up.  Recent CT imaging without hepatoma.  No evidence of cirrhosis.  Chronic postprandial epigastric pain/nausea: Previously used to have postprandial right upper quadrant pain seen on several years back.  Cholecystectomy recommended on several occasions but due to lack of insurance he was never able to complete.  Symptoms now somewhat different.  Recommend upper endoscopy for further evaluation, rule out gastritis/peptic ulcer disease/complicating GERD.  If unremarkable, consider biliary etiology.  Gallbladder recently appeared normal on CT.  GERD: Typical symptoms well-controlled.  Weight loss: Unintentional weight loss of 20 pounds as outlined.  Patient admits to decreased oral intake due to postprandial epigastric pain.  Recent CT chest/abdomen/pelvis showing stable bilateral pulmonary nodules, given history of prior smoking, would recommend chest CT in 12 months.  Requested patient to follow-up with PCP for this.  Also noted with pancreatic atrophy, dystrophic calcifications in the slightly enlarged prostate gland.  Plan for colonoscopy with upper endoscopy initially.  PLAN   CBC, CMET, TSH, HCVRNA, AFP tumor marker.   Colonoscopy/EGD, Dr. Jena Gauss, ASA 3.  I have discussed the risks, alternatives, benefits with regards to but not limited to the risk of  reaction to medication, bleeding, infection, perforation and the patient is agreeable to proceed. Written consent to be obtained. Will hold off on scheduling procedures until discuss with cardiology regarding recent syncopal episode. He may need reevaluation. Recommend surveillance of pulmonary nodules per PCP.   Leanna Battles. Melvyn Neth, MHS, PA-C [facility]

## 2023-02-10 NOTE — Patient Instructions (Signed)
Lab work to evaluate your liver and dizziness.  I will reach out to cardiology to see if they need to further evaluate you due to recent episode of passing out.  Once I speak with cardiology, if no additional work up needed, we will move forward with colonoscopy and upper endoscopy.  We will need to continue following you for history of liver fibrosis, this will involve considering ultrasound of the liver twice each year to screen for liver tumors.

## 2023-02-11 ENCOUNTER — Encounter: Payer: Self-pay | Admitting: Gastroenterology

## 2023-03-21 ENCOUNTER — Other Ambulatory Visit (HOSPITAL_COMMUNITY)
Admission: RE | Admit: 2023-03-21 | Discharge: 2023-03-21 | Disposition: A | Discharge status: Home / Self Care | Payer: Self-pay | Source: Ambulatory Visit | Attending: Physician Assistant | Admitting: Physician Assistant

## 2023-03-21 ENCOUNTER — Other Ambulatory Visit (HOSPITAL_COMMUNITY)
Admission: RE | Admit: 2023-03-21 | Discharge: 2023-03-21 | Disposition: A | Discharge status: Home / Self Care | Payer: Self-pay | Source: Ambulatory Visit | Attending: Gastroenterology | Admitting: Gastroenterology

## 2023-03-21 DIAGNOSIS — R634 Abnormal weight loss: Secondary | ICD-10-CM | POA: Insufficient documentation

## 2023-03-21 DIAGNOSIS — E785 Hyperlipidemia, unspecified: Secondary | ICD-10-CM | POA: Insufficient documentation

## 2023-03-21 DIAGNOSIS — K219 Gastro-esophageal reflux disease without esophagitis: Secondary | ICD-10-CM | POA: Insufficient documentation

## 2023-03-21 DIAGNOSIS — R1013 Epigastric pain: Secondary | ICD-10-CM | POA: Insufficient documentation

## 2023-03-21 DIAGNOSIS — R110 Nausea: Secondary | ICD-10-CM | POA: Insufficient documentation

## 2023-03-21 DIAGNOSIS — K2100 Gastro-esophageal reflux disease with esophagitis, without bleeding: Secondary | ICD-10-CM | POA: Insufficient documentation

## 2023-03-21 LAB — COMPREHENSIVE METABOLIC PANEL
ALT: 15 U/L (ref 0–44)
AST: 19 U/L (ref 15–41)
Albumin: 3.9 g/dL (ref 3.5–5.0)
Alkaline Phosphatase: 79 U/L (ref 38–126)
Anion gap: 8 (ref 5–15)
BUN: 7 mg/dL — ABNORMAL LOW (ref 8–23)
CO2: 28 mmol/L (ref 22–32)
Calcium: 8.9 mg/dL (ref 8.9–10.3)
Chloride: 101 mmol/L (ref 98–111)
Creatinine, Ser: 0.82 mg/dL (ref 0.61–1.24)
GFR, Estimated: >60 mL/min (ref >60)
Glucose, Bld: 129 mg/dL — ABNORMAL HIGH (ref 70–99)
Potassium: 3.6 mmol/L (ref 3.5–5.1)
Sodium: 137 mmol/L (ref 135–145)
Total Bilirubin: 0.8 mg/dL (ref 0.3–1.2)
Total Protein: 7.6 g/dL (ref 6.5–8.1)

## 2023-03-21 LAB — CBC WITH DIFFERENTIAL/PLATELET
Abs Immature Granulocytes: 0.01 10*3/uL (ref 0.00–0.07)
Basophils Absolute: 0.1 10*3/uL (ref 0.0–0.1)
Basophils Relative: 1 %
Eosinophils Absolute: 0.4 10*3/uL (ref 0.0–0.5)
Eosinophils Relative: 7 %
HCT: 42.6 % (ref 39.0–52.0)
Hemoglobin: 14.8 g/dL (ref 13.0–17.0)
Immature Granulocytes: 0 %
Lymphocytes Relative: 30 %
Lymphs Abs: 1.5 10*3/uL (ref 0.7–4.0)
MCH: 32.4 pg (ref 26.0–34.0)
MCHC: 34.7 g/dL (ref 30.0–36.0)
MCV: 93.2 fL (ref 80.0–100.0)
Monocytes Absolute: 0.3 10*3/uL (ref 0.1–1.0)
Monocytes Relative: 6 %
Neutro Abs: 2.8 10*3/uL (ref 1.7–7.7)
Neutrophils Relative %: 56 %
Platelets: 213 10*3/uL (ref 150–400)
RBC: 4.57 MIL/uL (ref 4.22–5.81)
RDW: 12.7 % (ref 11.5–15.5)
WBC: 5 10*3/uL (ref 4.0–10.5)
nRBC: 0 % (ref 0.0–0.2)

## 2023-03-21 LAB — TSH: TSH: 1.338 u[IU]/mL (ref 0.350–4.500)

## 2023-03-21 LAB — LIPID PANEL
Cholesterol: 98 mg/dL (ref 0–200)
HDL: 31 mg/dL — ABNORMAL LOW (ref >40)
LDL Cholesterol: 57 mg/dL (ref 0–99)
Total CHOL/HDL Ratio: 3.2 RATIO
Triglycerides: 48 mg/dL (ref <150)
VLDL: 10 mg/dL (ref 0–40)

## 2023-03-21 LAB — T4, FREE: Free T4: 0.67 ng/dL (ref 0.61–1.12)

## 2023-03-22 LAB — HCV RNA QUANT: HCV Quantitative: NOT DETECTED IU/mL (ref >50)

## 2023-03-22 LAB — AFP TUMOR MARKER: AFP, Serum, Tumor Marker: 2.6 ng/mL (ref 0.0–8.4)

## 2023-05-12 ENCOUNTER — Ambulatory Visit: Payer: Self-pay | Admitting: Physician Assistant

## 2023-05-12 ENCOUNTER — Encounter: Payer: Self-pay | Admitting: Physician Assistant

## 2023-05-12 ENCOUNTER — Telehealth: Payer: Self-pay | Admitting: Cardiology

## 2023-05-12 VITALS — BP 106/66 | HR 70 | Temp 98.1°F | Wt 132.5 lb

## 2023-05-12 DIAGNOSIS — J449 Chronic obstructive pulmonary disease, unspecified: Secondary | ICD-10-CM

## 2023-05-12 DIAGNOSIS — R55 Syncope and collapse: Secondary | ICD-10-CM

## 2023-05-12 DIAGNOSIS — R634 Abnormal weight loss: Secondary | ICD-10-CM

## 2023-05-12 DIAGNOSIS — E785 Hyperlipidemia, unspecified: Secondary | ICD-10-CM

## 2023-05-12 DIAGNOSIS — R918 Other nonspecific abnormal finding of lung field: Secondary | ICD-10-CM

## 2023-05-12 DIAGNOSIS — F1290 Cannabis use, unspecified, uncomplicated: Secondary | ICD-10-CM

## 2023-05-12 DIAGNOSIS — R079 Chest pain, unspecified: Secondary | ICD-10-CM

## 2023-05-12 DIAGNOSIS — F424 Excoriation (skin-picking) disorder: Secondary | ICD-10-CM

## 2023-05-12 MED ORDER — AMLODIPINE BESYLATE 2.5 MG PO TABS: ORAL_TABLET | ORAL | 0 refills | Status: DC

## 2023-05-12 MED ORDER — ALBUTEROL SULFATE HFA 108 (90 BASE) MCG/ACT IN AERS: INHALATION_SPRAY | RESPIRATORY_TRACT | 0 refills | Status: DC

## 2023-05-12 MED ORDER — OMEPRAZOLE 20 MG PO CPDR: DELAYED_RELEASE_CAPSULE | ORAL | 0 refills | Status: DC

## 2023-05-12 MED ORDER — ATORVASTATIN CALCIUM 40 MG PO TABS: ORAL_TABLET | ORAL | 0 refills | Status: DC

## 2023-05-12 MED ORDER — NITROGLYCERIN 0.4 MG SL SUBL: 0.4000 mg | SUBLINGUAL_TABLET | SUBLINGUAL | 99 refills | Status: DC | PRN

## 2023-05-12 MED ORDER — FLUTICASONE-SALMETEROL 100-50 MCG/ACT IN AEPB: INHALATION_SPRAY | RESPIRATORY_TRACT | 0 refills | Status: DC

## 2023-05-12 NOTE — Telephone Encounter (Signed)
Pt c/o of Chest Pain: STAT if CP now or developed within 24 hours  1. Are you having CP right now? No   2. Are you experiencing any other symptoms (ex. SOB, nausea, vomiting, sweating)? SOB  3. How long have you been experiencing CP? Has COPD  4. Is your CP continuous or coming and going? Coming and going  5. Have you taken Nitroglycerin? No  ?

## 2023-05-12 NOTE — Telephone Encounter (Signed)
Call placed to patient - appointment scheduled for this Friday with Sharlene Dory, NP.

## 2023-05-12 NOTE — Progress Notes (Signed)
BP 106/66   Pulse 70   Temp 98.1 F (36.7 C)   Wt 132 lb 8 oz (60.1 kg)   SpO2 95%   BMI 17.97 kg/m    Subjective:    Patient ID: Edgar Roberts, male    DOB: [birth year], 62 y.o.   MRN: [id number]  HPI: Edgar Roberts is a 62 y.o. male presenting on 05/12/2023 for No chief complaint on file.   HPI   Pt is 61yoM who is in today for routine follow up.  He has COPD, CP, dyslipidemia and unintentional weight loss.  Pt was seen by GI in April and colonocopy recommended but wanted to get cardiology clearance prior to test.  Pt was last seen by cardiology in march with recommendation to follow up 1 year.   Pt say he He had an 'episode; at work- he had LOC- he doesn'r remember what month but says it was before it got hot.  He says GI wants to do colonoscopy to evaluate weight loss but wants cardiology evaluation prior to anesthesia due to syncopal episode  Pt says he is Using albuterol inhaler 3-4 times/day.  It was less often before it got hot.   He is only using his advair qd  Pt says he Sometimes gets CP.  He says he doesn't get anxious.  He says he gets a little spooked b/c he doesn't want to go (ie to die).  He says it happens when he gets stressed.  He has a little helper at work that stresses him out.   He says sometimes it lsts for about 18-20 minutes.  He gets assoc sob.  He uses inhaler.  He is out of NTG.  He says before he ran out, he would take it and it owuld help.   He says it is more frequent than in the past- last week 2 or 3 times this happened.         Relevant past medical, surgical, family and social history reviewed and updated as indicated. Interim medical history since our last visit reviewed. Allergies and medications reviewed and updated.    Current Outpatient Medications:    albuterol (VENTOLIN HFA) 108 (90 Base) MCG/ACT inhaler, INHALE 2 PUFFS BY MOUTH EVERY 6 HOURS AS NEEDED FOR COUGHING, WHEEZING, OR SHORTNESS OF BREATH, Disp: 3 each, Rfl: 0    amLODipine (NORVASC) 2.5 MG tablet, TAKE 1 Tablet BY MOUTH ONCE EVERY DAY, Disp: 90 tablet, Rfl: 0   aspirin EC 81 MG tablet, Take 1 tablet (81 mg total) by mouth daily., Disp: 90 tablet, Rfl: 3   atorvastatin (LIPITOR) 40 MG tablet, TAKE 1 Tablet BY MOUTH ONCE EVERY DAY, Disp: 90 tablet, Rfl: 0   fluticasone-salmeterol (ADVAIR) 100-50 MCG/ACT AEPB, INHALE 1 PUFF BY MOUTH TWICE DAILY (EVERY 12 HOURS). RINSE MOUTH AFTER USE., Disp: 180 each, Rfl: 0   omeprazole (PRILOSEC) 20 MG capsule, TAKE 1 Capsule BY MOUTH ONCE EVERY DAY BEFORE BREAKFAST, Disp: 90 capsule, Rfl: 0   nitroGLYCERIN (NITROSTAT) 0.4 MG SL tablet, Place 1 tablet (0.4 mg total) under the tongue every 5 (five) minutes as needed for chest pain. May repeat for total 3 doses.  If still having CP, go to ER (Patient not taking: Reported on 05/12/2023), Disp: 25 tablet, Rfl: 3   Review of Systems  Per HPI unless specifically indicated above     Objective:    BP 106/66   Pulse 70   Temp 98.1 F (36.7 C)   Wt 132 lb 8  oz (60.1 kg)   SpO2 95%   BMI 17.97 kg/m   Wt Readings from Last 3 Encounters:  05/12/23 132 lb 8 oz (60.1 kg)  02/10/23 138 lb 3.2 oz (62.7 kg)  02/03/23 135 lb 8 oz (61.5 kg)    Physical Exam Vitals reviewed.  Constitutional:      General: He is not in acute distress.    Appearance: He is cachectic. He is not toxic-appearing.  HENT:     Head: Normocephalic and atraumatic.  Cardiovascular:     Rate and Rhythm: Normal rate and regular rhythm.     Heart sounds: No murmur heard.    No gallop.  Pulmonary:     Effort: Pulmonary effort is normal.     Breath sounds: Normal breath sounds. No wheezing.  Abdominal:     General: Bowel sounds are normal.     Palpations: Abdomen is soft. There is no mass.     Tenderness: There is no abdominal tenderness. There is no guarding or rebound.  Musculoskeletal:     Cervical back: Neck supple.     Right lower leg: No edema.     Left lower leg: No edema.   Lymphadenopathy:     Cervical: No cervical adenopathy.  Skin:    General: Skin is warm and dry.     Comments: Scattered areas of dry excoriated skin. No secondary infection  Neurological:     Mental Status: He is alert and oriented to person, place, and time.  Psychiatric:        Behavior: Behavior normal.     EKG- NSR at 73bpm without acute st-t changes/no changes compared with EKG 06/13/2022       Assessment & Plan:   Encounter Diagnoses  Name Primary?   Chronic obstructive pulmonary disease, unspecified COPD type (HCC) Yes   Chest pain, unspecified type    Hyperlipidemia, unspecified hyperlipidemia type    Weight loss    Marijuana smoker    Pulmonary nodules/lesions, multiple    Picking own skin    Syncope, unspecified syncope type         CP -Increasing episodes -Pt will need to f/u with cardiology -refilled NTG -discussed possible anxiety causing episodes.  He was scheduled to see Behavior Health Counselor  LOC -Pt will need to be seen by cardiology to be cleared per GI request prior to colonoscopy  COPD -Increase advair to bid and has albuterol mdi to use prn  Dyslipidemia -continue atorvastatin -No labs neede at this time  Pulmonary nodules/smoker -CT chest  due for repeat in December  Weight loss -awaiting colonoscopy   Pt will follow up 6 weeks.  He is to contact office sooner prn worsening or new symptoms

## 2023-05-12 NOTE — Telephone Encounter (Signed)
Call received from [facility], [place] -  patient seen in her clinic today.  She req'd patient be seen for increasing chest pain & syncopal episode.  See notes in epic.  She will fax [place] EKG to be scanned into epic.  Also, states that GI wanted him cleared before doing his colonoscopy.  Request that she have them send [place] clearance request note.  She verbalized understanding.

## 2023-05-15 ENCOUNTER — Telehealth: Payer: Self-pay | Admitting: Gastroenterology

## 2023-05-15 NOTE — Telephone Encounter (Signed)
I'm not sure at what point we are waiting on cardiac clearance, looks like we may have not initiated the process. Edgar Roberts reached out for our help. Patient is having more frequent chest pain and some syncope so she did get him set up for a follow up with his cardiologist.   Please send request for cardiac clearance for TCS/EGD. Can you let me know if you have him down for procedures and have orders?

## 2023-05-15 NOTE — Telephone Encounter (Signed)
Colonoscopy/EGD with Dr. Jena Gauss. ASA 3.  Dx: epigastric pain, nausea, weight loss, gerd  Need cardiac clearance due to syncope/chest pain.

## 2023-05-15 NOTE — Telephone Encounter (Signed)
Pulled the encounter and just labs was on it.

## 2023-05-16 ENCOUNTER — Telehealth: Payer: Self-pay | Admitting: Nurse Practitioner

## 2023-05-16 ENCOUNTER — Ambulatory Visit: Payer: Self-pay | Attending: Nurse Practitioner | Admitting: Nurse Practitioner

## 2023-05-16 ENCOUNTER — Other Ambulatory Visit: Payer: Self-pay | Admitting: Nurse Practitioner

## 2023-05-16 ENCOUNTER — Encounter: Payer: Self-pay | Admitting: Nurse Practitioner

## 2023-05-16 ENCOUNTER — Other Ambulatory Visit (INDEPENDENT_AMBULATORY_CARE_PROVIDER_SITE_OTHER): Payer: Self-pay

## 2023-05-16 VITALS — BP 122/70 | HR 88 | Ht 72.0 in | Wt 135.0 lb

## 2023-05-16 DIAGNOSIS — F1290 Cannabis use, unspecified, uncomplicated: Secondary | ICD-10-CM

## 2023-05-16 DIAGNOSIS — I10 Essential (primary) hypertension: Secondary | ICD-10-CM

## 2023-05-16 DIAGNOSIS — R634 Abnormal weight loss: Secondary | ICD-10-CM

## 2023-05-16 DIAGNOSIS — R200 Anesthesia of skin: Secondary | ICD-10-CM

## 2023-05-16 DIAGNOSIS — B182 Chronic viral hepatitis C: Secondary | ICD-10-CM

## 2023-05-16 DIAGNOSIS — F439 Reaction to severe stress, unspecified: Secondary | ICD-10-CM

## 2023-05-16 DIAGNOSIS — R55 Syncope and collapse: Secondary | ICD-10-CM

## 2023-05-16 DIAGNOSIS — J449 Chronic obstructive pulmonary disease, unspecified: Secondary | ICD-10-CM

## 2023-05-16 DIAGNOSIS — E785 Hyperlipidemia, unspecified: Secondary | ICD-10-CM

## 2023-05-16 DIAGNOSIS — R0789 Other chest pain: Secondary | ICD-10-CM

## 2023-05-16 NOTE — Progress Notes (Signed)
Cardiology Office Note:  .   Date:  05/16/2023 ID:  Edgar Roberts, DOB 02/02/1961, MRN [id number] PCP: Jacquelin Hawking, PA-C  [facility] Providers Cardiologist:  Dina Rich, MD    History of Present Illness: .   Edgar Roberts is a 62 y.o. male with a PMH of chest pain, HTN, COPD, GERD, chronic hepatitis C, HLD, weight loss, and asthma, who presents today for chest pain and syncopal episode evaluation.   Last seen by Dr. Dina Rich on January 08, 2023. Was overall doing well at the time.   Our office was recently contacted by Jacquelin Hawking, PA for evaluation for patient to be seen for increasing chest pain and syncopal episode. Also requesting for GI wanting him to be cleared before performing colonoscopy.   He presents today for evaluation. 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 Stated he had a funny feeling in his chest and felt short of breath. Said he lost consciousness for a few seconds, and his coworkers noticed that he passed out. Denied any falls or injuries at the time. States he also had a presyncopal, similar episode where he didn't feel good when getting up at the GI doctor's office a while ago. When describing his chest pain, he describes it as intermittent, left sided sharp chest pain, lasting around 15 minutes and associated with intense episodes of emotional upset. Says typically notices with stress associated with his job. Works as a Merchandiser, retail. Says rubbing his chest helps. Denies any other alleviating or aggravating factors. Doesn't believe NTG helps his pain. Says his fingers "cramp" and go numb along with his symptoms. Denies any palpitations, dizziness, orthopnea, PND, swelling or significant weight changes, acute bleeding, or claudication.  SH: Patient is a recovered alcoholic. Denies any tobacco use. Does occasionally smoke marijuana, denies any other illicit drug use.   Studies  Reviewed: Marland Kitchen    Lexiscan 12/04/2019: There was no ST segment deviation noted during stress. 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 This is a low risk study. There is no current myocardium at jeopardy The left ventricular ejection fraction is low normal (50%).  Physical Exam:   VS:  BP 122/70   Pulse 88   Ht 6' (1.829 m)   Wt 135 lb (61.2 kg)   SpO2 96%   BMI 18.31 kg/m    Wt Readings from Last 3 Encounters:  05/16/23 135 lb (61.2 kg)  05/12/23 132 lb 8 oz (60.1 kg)  02/10/23 138 lb 3.2 oz (62.7 kg)    GEN: Thin, 62 y.o. male in no acute distress NECK: No JVD; No carotid bruits CARDIAC: S1/S2, RRR, no murmurs, rubs, gallops RESPIRATORY:  Clear to auscultation without rales, wheezing or rhonchi  ABDOMEN: Soft, non-tender, non-distended EXTREMITIES:  No edema; No deformity   Negative orthostatics on exam today.   ASSESSMENT AND PLAN: .    Syncope Etiology sounds vasovagal, negative orthostatics on exam today. Will arrange 14 day live Zio XT monitor and 2D Echocardiogram to evaluate for any wall motion abnormalities. Most recent labs unremarkable. Advised against driving. Continue current medication regimen. Encouraged adequate fluid hydration and increasing salt intake.   Atypical chest pain, stress Etiology atypical, stress sounds to be triggering episodes. Reviewed NST in Dec 04, 2019 was found to be low risk. Discussed repeating ischemic evaluation however, pt declines at this time. Updating Echocardiogram as mentioned above. Continue current medication regimen. Discussed to focus on stress  relief techniques.   HTN BP stable. Discussed to monitor BP at home at least 2 hours after medications and sitting for 5-10 minutes. No medication changes at this time. Heart healthy diet and regular cardiovascular exercise encouraged.   HLD LDL 57 02/2023. Continue atorvastatin. Heart healthy diet and regular cardiovascular exercise encouraged.   COPD Denies any  acute exacerbations or recent shortness of breath. Continue current medication regimen. Continue to follow with PCP.  Weight loss, chronic hep C Believe his weight loss is contributing to his syncopal episode (#1). Continue to follow-up with GI and continue current medication regimen. If workup comes back negative, he will be cleared to undergo further workup by GI. Continue to follow with PCP.  Marijuana use Discussed smoking cessation and he verbalized understanding.   8. Numbness of fingers Etiology unclear. Sounds MSK related d/t his job. Encouraged him to continue to follow with PCP regarding this.   Dispo: Follow-up with me or APP in 6-8 weeks or sooner if anything changes.   Signed, Sharlene Dory, NP

## 2023-05-16 NOTE — Telephone Encounter (Signed)
Zio AT 2 weeks Dx: Syncope

## 2023-05-16 NOTE — Patient Instructions (Addendum)
Medication Instructions:  Your physician recommends that you continue on your current medications as directed. Please refer to the Current Medication list given to you today.   Labwork: None  Testing/Procedures: Your physician has requested that you have an echocardiogram. Echocardiography is a painless test that uses sound waves to create images of your heart. It provides your doctor with information about the size and shape of your heart and how well your heart's chambers and valves are working. This procedure takes approximately one hour. There are no restrictions for this procedure. Please do NOT wear cologne, perfume, aftershave, or lotions (deodorant is allowed). Please arrive 15 minutes prior to your appointment time.  Your physician has recommended that you wear a Zio monitor.   This monitor is a medical device that records the heart's electrical activity. Doctors most often use these monitors to diagnose arrhythmias. Arrhythmias are problems with the speed or rhythm of the heartbeat. The monitor is a small device applied to your chest. You can wear one while you do your normal daily activities. While wearing this monitor if you have any symptoms to push the button and record what you felt. Once you have worn this monitor for the period of time provider prescribed (for 14 days), you will return the monitor device in the postage paid box. Once it is returned they will download the data collected and provide [place] with a report which the provider will then review and we will call you with those results. Important tips:  Avoid showering during the first 48 hours of wearing the monitor. Avoid excessive sweating to help maximize wear time. Do not submerge the device, no hot tubs, and no swimming pools. Keep any lotions or oils away from the patch. After 48 hours you may shower with the patch on. Take brief showers with your back facing the shower head.  Do not remove patch once it has been placed  because that will interrupt data and decrease adhesive wear time. Push the button when you have any symptoms and write down what you were feeling. Once you have completed wearing your monitor, remove and place into box which has postage paid and place in your outgoing mailbox.  If for some reason you have misplaced your box then call our office and we can provide another box and/or mail it off for you.   Follow-Up: Your physician recommends that you schedule a follow-up appointment in: 6-8 weeks  Any Other Special Instructions Will Be Listed Below (If Applicable).  If you need a refill on your cardiac medications before your next appointment, please call your pharmacy.

## 2023-05-19 NOTE — Telephone Encounter (Signed)
  Request for patient to stop medication prior to procedure or is needing cleareance  05/19/23  Edgar Roberts 1961-09-15  What type of surgery is being performed? Colonoscopy/egd  When is surgery scheduled? TBD  What type of clearance is required (medical or pharmacy to hold medication or both?  Cardiac Clearance  Name of physician performing surgery?  Dr. Jaci Lazier [facility] at [facility] Phone: [phone] Fax: [phone]  Anethesia type (none, local, MAC, general)? MAC

## 2023-05-19 NOTE — Telephone Encounter (Signed)
   Name: Edgar Roberts  DOB: 03-19-1961  MRN: [id number]  Primary Cardiologist: Dina Rich, MD  Chart reviewed as part of pre-operative protocol coverage. The patient has an upcoming visit scheduled with Sharlene Dory on 07/02/2023 at which time clearance can be addressed in case there are any issues that would impact surgical recommendations.  Colonoscopy Is not scheduled until TBD as below. I added preop FYI to appointment note so that provider is aware to address at time of outpatient visit.  Per office protocol the cardiology provider should forward their finalized clearance decision and recommendations regarding antiplatelet therapy to the requesting party below.    I will route this message as FYI to requesting party and remove this message from the preop box as separate preop APP input not needed at this time.   Please call with any questions.  Joni Reining, NP  05/19/2023, 10:19 AM

## 2023-05-20 ENCOUNTER — Ambulatory Visit: Payer: Self-pay

## 2023-05-20 DIAGNOSIS — F419 Anxiety disorder, unspecified: Secondary | ICD-10-CM

## 2023-05-20 NOTE — Progress Notes (Signed)
This is session # 1 with Edgar Roberts for individual [facility] services for anxiety. Patient presents alone. BHP spent 30 minutes with patient.    Patient reports anxious symptoms including racing thoughts, difficulty relaxing, poor eating, and autonomic hyperactivity related to interpersonal stressors. Patient will likely benefit from individual support services to aid coping, processing, and decision-making.   This procedure has been fully reviewed with the patient and informed consent has been obtained.   Patient location: Pt seen in clinic via telehealth.   I connected with Edgar Roberts on 05/20/23 by a video enabled telemedicine application and verified that I am speaking with the correct person.   I discussed the limitations of evaluation and management by telemedicine. The patient expressed understanding and agreed to proceed.     ASSESSMENT AND PLAN   Current diagnosis: anxiety   We created the following treatment plan at today's appointment:               1)  05/20/23               2)  Decrease anxious symptoms through increased support, processing, and coping skills.               3)  Medication management not discussed at this time               4)  Next screening due in 6 sessions.     HISTORY OF PRESENT ILLNESS   Mental Status: Edgar Roberts presented oriented X4. Mood and affect were anxious. Judgment was appropriate. Memory was appropriate. Thought processes and content were  appropriate. Speech was appropriate. Patient denies SI/HI.     Narrative: Met with pt for scheduled appointment. Informed consent obtained verbally. Person-centered approach used to begin therapeutic alliance. Pt explores presenting concerns, sx, precipitating factors, supports, and resources. Normalization offered and plan created to address interpersonal stressor. Introduced deep breathing and grounding through bilateral tapping to regulate in stressful moments. Assessed for any other concerns pt wanted to address  today, which they denied. Scheduled follow up.   Effectiveness of the intervention(s) and the beneficiary's response or progress toward goal(s):  Patient is in a preparation stage of change to engage in treatment plan.

## 2023-06-03 ENCOUNTER — Ambulatory Visit: Payer: Self-pay

## 2023-06-06 ENCOUNTER — Ambulatory Visit: Payer: Self-pay | Admitting: Cardiology

## 2023-06-10 ENCOUNTER — Ambulatory Visit: Payer: Self-pay

## 2023-06-11 ENCOUNTER — Other Ambulatory Visit: Payer: Self-pay

## 2023-06-17 ENCOUNTER — Ambulatory Visit: Payer: Self-pay

## 2023-06-17 ENCOUNTER — Ambulatory Visit: Payer: Self-pay | Attending: Nurse Practitioner

## 2023-06-17 DIAGNOSIS — F419 Anxiety disorder, unspecified: Secondary | ICD-10-CM

## 2023-06-17 DIAGNOSIS — R55 Syncope and collapse: Secondary | ICD-10-CM

## 2023-06-17 NOTE — Progress Notes (Signed)
This is session # 2 with Edgar Roberts for individual [facility] services for anxiety. Patient presents alone. BHP spent 15 minutes with patient.    Patient notes improvement in symptoms overall today. Previously reported anxious symptoms including racing thoughts, difficulty relaxing, poor eating, and autonomic hyperactivity related to interpersonal stressors. With resolved stressors, pt reports no symptoms of distress or additional concerns to address today and decides to conclude behavioral health services at this time. Pt is aware that he is welcome to resume as desired and to reach out if he notices symptoms returning.   This procedure has been fully reviewed with the patient and informed consent has been obtained.   Patient location: Pt seen in clinic via telehealth.   I connected with Edgar Roberts on 06/17/23 by a video enabled telemedicine application and verified that I am speaking with the correct person.   I discussed the limitations of evaluation and management by telemedicine. The patient expressed understanding and agreed to proceed.      HISTORY OF PRESENT ILLNESS   Mental Status: [name] presented oriented X4. Mood and affect were euthymic and appropriate. Judgment was appropriate. Memory was appropriate. Thought processes and content were  appropriate. Speech was appropriate. Patient denies SI/HI.     Narrative: Met with pt for scheduled appointment. Person-centered approach used to continue therapeutic alliance. Pt provides updates since previous visit including successfully managing interpersonal stressor and no anxious symptoms since then. Updated screeners and pt scored a 2 for PHQ9 and 1 for GAD7. Affirmation and normalization provided. Assessed for any other concerns pt wanted to address today, which they denied. Concluded services at this time, with pt aware to reach out as desired or should symptoms return.

## 2023-06-18 LAB — ECHOCARDIOGRAM COMPLETE
AR max vel: 1.86 cm2
AV Area VTI: 2.16 cm2
AV Area mean vel: 1.96 cm2
AV Mean grad: 4 mmHg
AV Peak grad: 7.7 mmHg
Ao pk vel: 1.39 m/s
Area-P 1/2: 3.99 cm2
Calc EF: 55 %
MV VTI: 2 cm2
S' Lateral: 3.1 cm
Single Plane A2C EF: 55 %
Single Plane A4C EF: 52.3 %

## 2023-06-19 ENCOUNTER — Telehealth: Payer: Self-pay

## 2023-06-19 NOTE — Telephone Encounter (Signed)
-----   Message from Sharlene Dory sent at 06/19/2023 12:15 PM EDT ----- Echocardiogram reviewed.  Normal heart pumping function.  Overall his valves look good.  Overall reassuring echocardiogram report! I will review this with him at next office visit with me.   Thanks!  Sharlene Dory, AGNP-C

## 2023-06-19 NOTE — Telephone Encounter (Signed)
Patient informed and verbalized understanding. Sent Copy to PCP

## 2023-06-23 ENCOUNTER — Ambulatory Visit: Payer: Self-pay | Admitting: Physician Assistant

## 2023-07-02 ENCOUNTER — Ambulatory Visit: Payer: Self-pay | Admitting: Nurse Practitioner

## 2023-07-07 ENCOUNTER — Encounter: Payer: Self-pay | Admitting: Physician Assistant

## 2023-07-07 ENCOUNTER — Ambulatory Visit: Payer: Self-pay | Admitting: Physician Assistant

## 2023-07-07 VITALS — BP 114/70 | HR 87 | Temp 98.0°F | Wt 132.5 lb

## 2023-07-07 DIAGNOSIS — K0889 Other specified disorders of teeth and supporting structures: Secondary | ICD-10-CM

## 2023-07-07 DIAGNOSIS — K029 Dental caries, unspecified: Secondary | ICD-10-CM

## 2023-07-07 DIAGNOSIS — E785 Hyperlipidemia, unspecified: Secondary | ICD-10-CM

## 2023-07-07 DIAGNOSIS — J449 Chronic obstructive pulmonary disease, unspecified: Secondary | ICD-10-CM

## 2023-07-07 DIAGNOSIS — R634 Abnormal weight loss: Secondary | ICD-10-CM

## 2023-07-07 MED ORDER — AMOXICILLIN 500 MG PO CAPS: 500.0000 mg | ORAL_CAPSULE | Freq: Three times a day (TID) | ORAL | 0 refills | Status: AC

## 2023-07-07 NOTE — Progress Notes (Signed)
BP 114/70   Pulse 87   Temp 98 F (36.7 C)   Wt 132 lb 8 oz (60.1 kg)   SpO2 97%   BMI 17.97 kg/m    Subjective:    Patient ID: Edgar Roberts, male    DOB: November 09, 1960, 62 y.o.   MRN: [id number]  HPI: Edgar Roberts is a 62 y.o. male presenting on 07/07/2023 for No chief complaint on file.   HPI    Pt is 62yoM who is in today for follow up.   He has been seen by cardiology and has appointment to see them again next month.  He saw [facility] and says everything is doing good so he is not returning there.  He says his anxiety is good  He says his breathing is okay.  Harder when it gets hot   For his unexplained Weight loss, he is awaiting colonoscopy which is being held up due to awaiting cardiac clearance  Pt reports a Tooth problem.  It has been bothering him about 2 months-   2 teeth- one on each side    Relevant past medical, surgical, family and social history reviewed and updated as indicated. Interim medical history since our last visit reviewed. Allergies and medications reviewed and updated.    Current Outpatient Medications:    albuterol (VENTOLIN HFA) 108 (90 Base) MCG/ACT inhaler, INHALE 2 PUFFS BY MOUTH EVERY 6 HOURS AS NEEDED FOR COUGHING, WHEEZING, OR SHORTNESS OF BREATH, Disp: 3 each, Rfl: 0   amLODipine (NORVASC) 2.5 MG tablet, TAKE 1 Tablet BY MOUTH ONCE EVERY DAY, Disp: 90 tablet, Rfl: 0   atorvastatin (LIPITOR) 40 MG tablet, TAKE 1 Tablet BY MOUTH ONCE EVERY DAY, Disp: 90 tablet, Rfl: 0   fluticasone-salmeterol (ADVAIR) 100-50 MCG/ACT AEPB, INHALE 1 PUFF BY MOUTH TWICE DAILY (EVERY 12 HOURS). RINSE MOUTH AFTER USE., Disp: 180 each, Rfl: 0   omeprazole (PRILOSEC) 20 MG capsule, TAKE 1 Capsule BY MOUTH ONCE EVERY DAY BEFORE BREAKFAST, Disp: 90 capsule, Rfl: 0   aspirin EC 81 MG tablet, Take 1 tablet (81 mg total) by mouth daily. (Patient not taking: Reported on 07/07/2023), Disp: 90 tablet, Rfl: 3   nitroGLYCERIN (NITROSTAT) 0.4 MG SL tablet, Place 1 tablet (0.4 mg  total) under the tongue every 5 (five) minutes as needed for chest pain. May repeat for total 3 doses.  If still having CP, go to ER, Disp: 25 tablet, Rfl: PRN   Review of Systems  Per HPI unless specifically indicated above     Objective:    BP 114/70   Pulse 87   Temp 98 F (36.7 C)   Wt 132 lb 8 oz (60.1 kg)   SpO2 97%   BMI 17.97 kg/m   Wt Readings from Last 3 Encounters:  07/07/23 132 lb 8 oz (60.1 kg)  05/16/23 135 lb (61.2 kg)  05/12/23 132 lb 8 oz (60.1 kg)    Physical Exam Vitals reviewed.  Constitutional:      General: He is not in acute distress.    Appearance: He is well-developed. He is not toxic-appearing.  HENT:     Head: Normocephalic and atraumatic.     Mouth/Throat:     Dentition: Abnormal dentition. Dental tenderness and dental caries present. No dental abscesses.  Cardiovascular:     Rate and Rhythm: Normal rate and regular rhythm.  Pulmonary:     Effort: Pulmonary effort is normal.     Breath sounds: Normal breath sounds. No wheezing or rhonchi.  Abdominal:  General: Bowel sounds are normal.     Palpations: Abdomen is soft.     Tenderness: There is no abdominal tenderness.  Musculoskeletal:     Cervical back: Neck supple.     Right lower leg: No edema.     Left lower leg: No edema.  Lymphadenopathy:     Cervical: No cervical adenopathy.  Skin:    General: Skin is warm and dry.  Neurological:     Mental Status: He is alert and oriented to person, place, and time.  Psychiatric:        Behavior: Behavior normal.           Assessment & Plan:    Encounter Diagnoses  Name Primary?   Dentalgia Yes   Dental decay    Weight loss    Chronic obstructive pulmonary disease, unspecified COPD type (HCC)    Hyperlipidemia, unspecified hyperlipidemia type       -pt to continue with current meds -pt to continue with cardiology per their recommendation -will order Chest CT which is due December to monitor pulmonary nodules -refer to  Dentist  and rx amoxil -pt to follow up 3 months. He will be due recheck lipids at that time. He is to contact office sooner prn

## 2023-07-14 ENCOUNTER — Encounter: Payer: Self-pay | Admitting: Nurse Practitioner

## 2023-07-14 ENCOUNTER — Ambulatory Visit: Payer: Self-pay | Attending: Nurse Practitioner | Admitting: Nurse Practitioner

## 2023-07-14 VITALS — BP 112/60 | HR 92 | Ht 72.0 in | Wt 132.4 lb

## 2023-07-14 DIAGNOSIS — F1290 Cannabis use, unspecified, uncomplicated: Secondary | ICD-10-CM

## 2023-07-14 DIAGNOSIS — J449 Chronic obstructive pulmonary disease, unspecified: Secondary | ICD-10-CM

## 2023-07-14 DIAGNOSIS — B182 Chronic viral hepatitis C: Secondary | ICD-10-CM

## 2023-07-14 DIAGNOSIS — I10 Essential (primary) hypertension: Secondary | ICD-10-CM

## 2023-07-14 DIAGNOSIS — E785 Hyperlipidemia, unspecified: Secondary | ICD-10-CM

## 2023-07-14 DIAGNOSIS — Z87898 Personal history of other specified conditions: Secondary | ICD-10-CM

## 2023-07-14 MED ORDER — BLOOD PRESSURE MONITOR DEVI: 1.0000 | Freq: Every day | 0 refills | Status: DC

## 2023-07-14 NOTE — Patient Instructions (Addendum)

## 2023-07-14 NOTE — Progress Notes (Unsigned)
Cardiology Office Note:  .   Date:  07/14/2023 ID:  [name], DOB [birth year], MRN [id number] PCP: Jacquelin Hawking, PA-C  [facility] Providers Cardiologist:  Dina Rich, MD    History of Present Illness: .   Jsutin Bieker is a 62 y.o. male with a PMH of chest pain, HTN, COPD, GERD, chronic hepatitis C, HLD, weight loss, and asthma, who presents today for chest pain and syncopal episode evaluation.   Last seen by Dr. Dina Rich on January 08, 2023. Was overall doing well at the time.   Our office was recently contacted by Jacquelin Hawking, PA for evaluation for patient to be seen for increasing chest pain and syncopal episode. Also requesting for GI wanting him to be cleared before performing colonoscopy.   I last saw patient for evaluation on May 16, 2023. 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 Stated he had a funny feeling in his chest and felt short of breath. Reported LOC for a few seconds, and his coworkers noticed that he passed out. Denied any falls or injuries at the time. Stated he also had a presyncopal, similar episode where he didn't feel good when getting up at the GI doctor's office a while ago. Described intermittent, left sided sharp chest pain, lasting around 15 minutes and associated with intense episodes of emotional upset. Typically noticed with stress associated with his job. Works as a Merchandiser, retail. Says rubbing chest helped. Denied any other alleviating or aggravating factors. Doesn't believe NTG helps his pain. Noted fingers "cramping" and going numb along with his symptoms. Denied any palpitations, dizziness, orthopnea, PND, swelling or significant weight changes, acute bleeding, or claudication. Negative orthostatics on exam. Echocardiogram and monitor arranged - reports noted below.   Today he presents for follow-up. Doing great. Denies any more recurrent episodes of syncope or pre-syncope.  Denies any chest pain, shortness of breath, palpitations, syncope, presyncope, dizziness, orthopnea, PND, swelling or significant weight changes, acute bleeding, or claudication.  SH: Patient is a recovered alcoholic. Denies any tobacco use. Does occasionally smoke marijuana, denies any other illicit drug use.   Studies Reviewed: .   Echo 05/2023: 1. Left ventricular ejection fraction, by estimation, is 55 to 60%. The  left ventricle has normal function. The left ventricle has no regional  wall motion abnormalities. Left ventricular diastolic parameters are  consistent with Grade I diastolic  dysfunction (impaired relaxation).   2. Right ventricular systolic function is normal. The right ventricular  size is normal. Tricuspid regurgitation signal is inadequate for assessing  PA pressure.   3. The mitral valve is normal in structure. No evidence of mitral valve  regurgitation. No evidence of mitral stenosis.   4. The aortic valve has an indeterminant number of cusps. Aortic valve  regurgitation is not visualized. No aortic stenosis is present.   5. The inferior vena cava is normal in size with greater than 50%  respiratory variability, suggesting right atrial pressure of 3 mmHg.   Comparison(s): No prior study.  Cardiac Monitor 05/2023 - preliminary report: Patch Wear Time:  14 days and 0 hours (2024-07-19T16:54:47-0400 to 2024-08-02T16:54:47-0400)   Patient had a min HR of 51 bpm, max HR of 226 bpm, and avg HR of 86 bpm. Predominant underlying rhythm was Sinus Rhythm. 3 Ventricular Tachycardia runs occurred, the run with the fastest interval lasting 5 beats with a max rate of 226 bpm, the longest  lasting 7 beats with  an avg rate of 110 bpm. Isolated SVEs were rare (<1.0%), and no SVE Couplets or SVE Triplets were present. Isolated VEs were rare (<1.0%), VE Couplets were rare (<1.0%), and no VE Triplets were present. Ventricular Bigeminy was  present.   Lexiscan 10/2019: There was no ST  segment deviation noted during stress. 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 This is a low risk study. There is no current myocardium at jeopardy The left ventricular ejection fraction is low normal (50%).  Physical Exam:   VS:  BP 112/60   Pulse 92   Ht 6' (1.829 m)   Wt 132 lb 6.4 oz (60.1 kg)   SpO2 94%   BMI 17.96 kg/m    Wt Readings from Last 3 Encounters:  07/14/23 132 lb 6.4 oz (60.1 kg)  07/07/23 132 lb 8 oz (60.1 kg)  05/16/23 135 lb (61.2 kg)    GEN: Thin, 62 y.o. male in no acute distress NECK: No JVD; No carotid bruits CARDIAC: S1/S2, RRR, no murmurs, rubs, gallops RESPIRATORY:  Clear to auscultation without rales, wheezing or rhonchi  ABDOMEN: Soft, non-tender, non-distended EXTREMITIES:  No edema; No deformity    ASSESSMENT AND PLAN: .    Hx of syncope Etiology sounds vasovagal, previous negative orthostatics. Echo overall benign with preliminary monitor report revealing mainly SR, 3 VT runs, with longest run lasting 7 beats, no significant arrhythmias or pauses noted. Previously advised against driving. Continue current medication regimen. Care precautions discussed.   HTN BP stable. Discussed to monitor BP at home at least 2 hours after medications and sitting for 5-10 minutes. No medication changes at this time. Heart healthy diet and regular cardiovascular exercise encouraged. Will provide Rx for BP cuff to monitor at home.   HLD LDL 57 02/2023. Continue atorvastatin. Heart healthy diet and regular cardiovascular exercise encouraged.   COPD Denies any acute exacerbations or recent shortness of breath. Continue current medication regimen. Continue to follow with PCP.  Chronic hep C Continue to follow-up with GI and continue current medication regimen.   Marijuana use Discussed smoking cessation and he verbalized understanding.   Dispo: Follow-up with Dr. Dina Rich or APP in 6 months or sooner if anything  changes.  Signed, Sharlene Dory, NP

## 2023-07-16 NOTE — Telephone Encounter (Signed)
Pt was scheduled for Monday at 11:30

## 2023-07-16 NOTE — Progress Notes (Addendum)
  Mr. Irey perioperative risk of a major cardiac event is 0.9% according to the Revised Cardiac Risk Index (RCRI).  Therefore, he is at low risk for perioperative complications.   His functional capacity is good at > 4 METs according to the Duke Activity Status Index (DASI). Recommendations: According to ACC/AHA guidelines, no further cardiovascular testing needed.  The patient may proceed to surgery at acceptable risk.   Antiplatelet and/or Anticoagulation Recommendations: The patient should remain on Aspirin without interruption.  If surgeon feels as though bleeding risk is too high, may hold Aspirin for 7 days prior to procedure and resume when felt safe to do so. Will route note to requesting party.

## 2023-07-16 NOTE — Telephone Encounter (Signed)
Now that we finally have cardiac clearance, work up delayed due to patient missed appts, we can moved forward with scheduling procedures. However, we have not seen him since 01/2023, therefore he will need to come back in to be seen, we will schedule after that ov.  Tammy C, see if we can get patient in next week.

## 2023-07-20 NOTE — Progress Notes (Unsigned)
GI Office Note    Referring Provider: Jacquelin Hawking, PA-C Primary Care Physician:  Jacquelin Hawking, PA-C  Primary Gastroenterologist: Roetta Sessions, MD   Chief Complaint   No chief complaint on file.   History of Present Illness   Edgar Roberts is a 62 y.o. male presenting today for follow up. Last seen 01/2023 for weight loss of >20 pounds.   Patient with history of chronic hepatitis C, genotype 1b.  He had F3 and F4 fibrosis scores previously.  Treated with 12 weeks of Harvoni. Also history of right upper quadrant abdominal pain off and on for 3 years, previously recommended cholecystectomy along with liver biopsy but he has postponed on numerous occasions in part due to lack of insurance.  History of adenomyomatosis of the gallbladder, questionable gallstones. Given stage III fibrosis/macro inflammatory activity grade A1 to A2 we planned hepatoma surveillance. Patient was lost to follow up.    Patient with unintentional weight loss of greater than 20 pounds. CT chest/abd/pelvis 09/2022 with stable bilateral pulmonary nodules. Nonobstructive left nephrolithiasis. Dystrophic calcificaitons in slightly enlarged prostate gland. Pancreatic atrophy. No focal liver lesions.       EGD 2016: -mild erosive reflux esophagitis s/p dilaiton -hiatal hernia -H. Pylori gastritis, treated with pylera, confirmed eradication 10/2017 Medications   Current Outpatient Medications  Medication Sig Dispense Refill   albuterol (VENTOLIN HFA) 108 (90 Base) MCG/ACT inhaler INHALE 2 PUFFS BY MOUTH EVERY 6 HOURS AS NEEDED FOR COUGHING, WHEEZING, OR SHORTNESS OF BREATH 3 each 0   amLODipine (NORVASC) 2.5 MG tablet TAKE 1 Tablet BY MOUTH ONCE EVERY DAY 90 tablet 0   aspirin EC 81 MG tablet Take 1 tablet (81 mg total) by mouth daily. 90 tablet 3   atorvastatin (LIPITOR) 40 MG tablet TAKE 1 Tablet BY MOUTH ONCE EVERY DAY 90 tablet 0   Blood Pressure Monitor DEVI 1 each by Does not apply route daily.  1 each 0   fluticasone-salmeterol (ADVAIR) 100-50 MCG/ACT AEPB INHALE 1 PUFF BY MOUTH TWICE DAILY (EVERY 12 HOURS). RINSE MOUTH AFTER USE. 180 each 0   nitroGLYCERIN (NITROSTAT) 0.4 MG SL tablet Place 1 tablet (0.4 mg total) under the tongue every 5 (five) minutes as needed for chest pain. May repeat for total 3 doses.  If still having CP, go to ER 25 tablet PRN   omeprazole (PRILOSEC) 20 MG capsule TAKE 1 Capsule BY MOUTH ONCE EVERY DAY BEFORE BREAKFAST 90 capsule 0   No current facility-administered medications for this visit.    Allergies   Allergies as of 07/21/2023 - Review Complete 07/14/2023  Allergen Reaction Noted   Bee venom Swelling 06/06/2022   Other Rash 06/06/2022     Past Medical History   Past Medical History:  Diagnosis Date   Asthma    Chronic hepatitis C (HCC)    COPD (chronic obstructive pulmonary disease) (HCC)    GERD (gastroesophageal reflux disease)     Past Surgical History   Past Surgical History:  Procedure Laterality Date   BIOPSY N/A 09/04/2015   Procedure: BIOPSY;  Surgeon: Corbin Ade, MD;  Location: [facility];  Service: Endoscopy;  Laterality: N/A;  gastric   ESOPHAGEAL DILATION N/A 09/04/2015   Procedure:  ESOPHAGEAL DILATION;  Surgeon: Corbin Ade, MD;  Location: [facility];  Service: Endoscopy;  Laterality: N/A;  maloney 56   ESOPHAGOGASTRODUODENOSCOPY (EGD) WITH PROPOFOL N/A 09/04/2015   Dr. Jena Gauss: mild erosive reflux esophagitis. H.pylori gastritis   INGUINAL HERNIA REPAIR Right 06/14/2022  Procedure: HERNIA REPAIR INGUINAL ADULT;  Surgeon: Lucretia Roers, MD;  Location: [facility];  Service: General;  Laterality: Right;   none      Past Family History   Family History  Problem Relation Age of Onset   Cirrhosis Father        deceased   Heart disease Sister        3 open heart surgeries   Heart disease Mother    Heart disease Other        grandmother   Colon cancer Neg Hx     Past Social History   Social History    Socioeconomic History   Marital status: Divorced    Spouse name: Not on file   Number of children: Not on file   Years of education: Not on file   Highest education level: Not on file  Occupational History   Not on file  Tobacco Use   Smoking status: Former    Current packs/day: 0.00    Average packs/day: 2.0 packs/day for 6.4 years (12.8 ttl pk-yrs)    Types: Cigarettes    Start date: 06/06/1976    Quit date: 10/28/1982    Years since quitting: 40.7   Smokeless tobacco: Never   Tobacco comments:    only smokes marijuana  Vaping Use   Vaping status: Never Used  Substance and Sexual Activity   Alcohol use: No    Alcohol/week: 0.0 standard drinks of alcohol    Comment: quit 01/2014   Drug use: Yes    Types: Marijuana    Comment: everyday   Sexual activity: Yes    Birth control/protection: None  Other Topics Concern   Not on file  Social History Narrative   Not on file   Social Determinants of Health   Financial Resource Strain: Not on file  Food Insecurity: Not on file  Transportation Needs: Not on file  Physical Activity: Not on file  Stress: Not on file  Social Connections: Not on file  Intimate Partner Violence: Not on file    Review of Systems   General: Negative for anorexia, weight loss, fever, chills, fatigue, weakness. ENT: Negative for hoarseness, difficulty swallowing , nasal congestion. CV: Negative for chest pain, angina, palpitations, dyspnea on exertion, peripheral edema.  Respiratory: Negative for dyspnea at rest, dyspnea on exertion, cough, sputum, wheezing.  GI: See history of present illness. GU:  Negative for dysuria, hematuria, urinary incontinence, urinary frequency, nocturnal urination.  Endo: Negative for unusual weight change.     Physical Exam   There were no vitals taken for this visit.   General: Well-nourished, well-developed in no acute distress.  Eyes: No icterus. Mouth: Oropharyngeal mucosa moist and pink , no lesions erythema  or exudate. Lungs: Clear to auscultation bilaterally.  Heart: Regular rate and rhythm, no murmurs rubs or gallops.  Abdomen: Bowel sounds are normal, nontender, nondistended, no hepatosplenomegaly or masses,  no abdominal bruits or hernia , no rebound or guarding.  Rectal: ***  Extremities: No lower extremity edema. No clubbing or deformities. Neuro: Alert and oriented x 4   Skin: Warm and dry, no jaundice.   Psych: Alert and cooperative, normal mood and affect.  Labs   Lab Results  Component Value Date   NA 137 03/21/2023   CL 101 03/21/2023   K 3.6 03/21/2023   CO2 28 03/21/2023   BUN 7 (L) 03/21/2023   CREATININE 0.82 03/21/2023   GFRNONAA >60 03/21/2023   CALCIUM 8.9 03/21/2023   ALBUMIN  3.9 03/21/2023   GLUCOSE 129 (H) 03/21/2023   Lab Results  Component Value Date   ALT 15 03/21/2023   AST 19 03/21/2023   ALKPHOS 79 03/21/2023   BILITOT 0.8 03/21/2023   Lab Results  Component Value Date   TSH 1.338 03/21/2023   Lab Results  Component Value Date   WBC 5.0 03/21/2023   HGB 14.8 03/21/2023   HCT 42.6 03/21/2023   MCV 93.2 03/21/2023   PLT 213 03/21/2023   AFP 2.6 02/2023 HCV RNA not detected 02/2023  Imaging Studies   No results found.  Assessment       PLAN   ***   Leanna Battles. Melvyn Neth, MHS, PA-C [facility]

## 2023-07-21 ENCOUNTER — Encounter: Payer: Self-pay | Admitting: Gastroenterology

## 2023-07-21 ENCOUNTER — Ambulatory Visit (INDEPENDENT_AMBULATORY_CARE_PROVIDER_SITE_OTHER): Payer: Self-pay | Admitting: Gastroenterology

## 2023-07-21 ENCOUNTER — Encounter: Payer: Self-pay | Admitting: *Deleted

## 2023-07-21 ENCOUNTER — Other Ambulatory Visit: Payer: Self-pay

## 2023-07-21 ENCOUNTER — Telehealth: Payer: Self-pay | Admitting: Gastroenterology

## 2023-07-21 VITALS — BP 119/72 | HR 67 | Temp 97.9°F | Ht 72.0 in | Wt 132.6 lb

## 2023-07-21 DIAGNOSIS — Z1211 Encounter for screening for malignant neoplasm of colon: Secondary | ICD-10-CM | POA: Insufficient documentation

## 2023-07-21 DIAGNOSIS — K219 Gastro-esophageal reflux disease without esophagitis: Secondary | ICD-10-CM

## 2023-07-21 DIAGNOSIS — R1319 Other dysphagia: Secondary | ICD-10-CM

## 2023-07-21 DIAGNOSIS — K7460 Unspecified cirrhosis of liver: Secondary | ICD-10-CM

## 2023-07-21 DIAGNOSIS — R1013 Epigastric pain: Secondary | ICD-10-CM

## 2023-07-21 DIAGNOSIS — R634 Abnormal weight loss: Secondary | ICD-10-CM

## 2023-07-21 DIAGNOSIS — R1310 Dysphagia, unspecified: Secondary | ICD-10-CM

## 2023-07-21 MED ORDER — PEG 3350-KCL-NA BICARB-NACL 420 G PO SOLR: 4000.0000 mL | Freq: Once | ORAL | 0 refills | Status: AC

## 2023-07-21 NOTE — Telephone Encounter (Signed)
Please plan for the follow in 08/2023.  RUQ U/S with elastography.   Labs: AFP, CBC, CMET, PT/INR, HCV Fibrosure.

## 2023-07-21 NOTE — Telephone Encounter (Signed)
Spoke with pt. Scheduled RUQ w/ elast [place] in NOV.

## 2023-07-21 NOTE — Addendum Note (Signed)
Addended by: Armstead Peaks on: 07/21/2023 02:10 PM   Modules accepted: Orders

## 2023-07-21 NOTE — Telephone Encounter (Signed)
Labs have been ordered and will be mailed to the pt to have completed in Nov.

## 2023-07-21 NOTE — Telephone Encounter (Signed)
No follow up to be scheduled yet. We will wait until after he completes his egd/colonoscopy

## 2023-07-21 NOTE — Patient Instructions (Signed)
Colonoscopy and upper endoscopy to be scheduled.  Continue omeprazole 20mg  daily before breakfast.

## 2023-07-23 ENCOUNTER — Ambulatory Visit: Payer: Self-pay | Admitting: Physician Assistant

## 2023-07-23 NOTE — Progress Notes (Signed)
Pt is here today to receive a home blood pressure monitor (provided by [facility]) and to be educated on how to use it correctly.  LPN provided pt a wrist home blood pressure monitor and educated pt on how to use it correctly. Pt verbalized and demonstrated understanding by performing teach back. Pt was also provided educational reading material regarding this.   Pt's blood pressure reading today on his self blood pressure check was 106/60, pulse 86.

## 2023-08-18 ENCOUNTER — Other Ambulatory Visit: Payer: Self-pay

## 2023-08-18 DIAGNOSIS — K7460 Unspecified cirrhosis of liver: Secondary | ICD-10-CM

## 2023-08-22 ENCOUNTER — Ambulatory Visit: Payer: Self-pay | Admitting: Nurse Practitioner

## 2023-09-01 ENCOUNTER — Ambulatory Visit (HOSPITAL_COMMUNITY): Payer: Self-pay | Attending: Gastroenterology

## 2023-09-12 NOTE — Patient Instructions (Signed)
ZATHAN BERTH  09/12/2023     @PREFPERIOPPHARMACY @   Your procedure is scheduled on 09/17/2023.  Report to [facility] at 7:15 A.M.  Call this number if you have problems the morning of surgery:  [phone]  If you experience any cold or flu symptoms such as cough, fever, chills, shortness of breath, etc. between now and your scheduled surgery, please notify us at the above number.   Remember:   Do not eat anything after midnight.   You may drink clear liquids until 6:15am .  Clear liquids allowed are:                    Water, Carbonated beverages (diabetics please choose diet or no sugar options), Clear Tea (No creamer, milk, or cream, including half & half and powdered creamer), Black Coffee Only (No creamer, milk or cream, including half & half and powdered creamer), and Clear Sports drink (No red color; diabetics please choose diet or no sugar options)    Take these medicines the morning of surgery with A SIP OF WATER : Amlodipine and Omeprazole    Do not wear jewelry, make-up or nail polish, including gel polish,  artificial nails, or any other type of covering on natural nails (fingers and  toes).  Do not wear lotions, powders, or perfumes, or deodorant.  Do not shave 48 hours prior to surgery.  Men may shave face and neck.  Do not bring valuables to the hospital.  [facility] is not responsible for any belongings or valuables.  Contacts, dentures or bridgework may not be worn into surgery.  Leave your suitcase in the car.  After surgery it may be brought to your room.  For patients admitted to the hospital, discharge time will be determined by your treatment team.  Patients discharged the day of surgery will not be allowed to drive home.   Name and phone number of your driver:   Family Special instructions:  N/A  Please read over the following fact sheets that you were given. Care and Recovery After Surgery  Colonoscopy, Adult A colonoscopy is a procedure to  look at the entire large intestine. This procedure is done using a long, thin, flexible tube that has a camera on the end. You may have a colonoscopy: As a part of normal colorectal screening. If you have certain symptoms, such as: A low number of red blood cells in your blood (anemia). Diarrhea that does not go away. Pain in your abdomen. Blood in your stool. A colonoscopy can help screen for and diagnose medical problems, including: An abnormal growth of cells or tissue (tumor). Abnormal growths within the lining of your intestine (polyps). Inflammation. Areas of bleeding. Tell your health care provider about: Any allergies you have. All medicines you are taking, including vitamins, herbs, eye drops, creams, and over-the-counter medicines. Any problems you or family members have had with anesthetic medicines. Any bleeding problems you have. Any surgeries you have had. Any medical conditions you have. Any problems you have had with having bowel movements. Whether you are pregnant or may be pregnant. What are the risks? Generally, this is a safe procedure. However, problems may occur, including: Bleeding. Damage to your intestine. Allergic reactions to medicines given during the procedure. Infection. This is rare. What happens before the procedure? Eating and drinking restrictions Follow instructions from your health care provider about eating or drinking restrictions, which may include: A few days before the procedure: Follow a low-fiber diet.  Avoid nuts, seeds, dried fruit, raw fruits, and vegetables. 1-3 days before the procedure: Eat only gelatin dessert or ice pops. Drink only clear liquids, such as water, clear juice, clear broth or bouillon, black coffee or tea, or clear soft drinks or sports drinks. Avoid liquids that contain red or purple dye. The day of the procedure: Do not eat solid foods. You may continue to drink clear liquids until up to 2 hours before the  procedure. Do not eat or drink anything starting 2 hours before the procedure, or within the time period that your health care provider recommends. Bowel prep If you were prescribed a bowel prep to take by mouth (orally) to clean out your colon: Take it as told by your health care provider. Starting the day before your procedure, you will need to drink a large amount of liquid medicine. The liquid will cause you to have many bowel movements of loose stool until your stool becomes almost clear or light green. If your skin or the opening between the buttocks (anus) gets irritated from diarrhea, you may relieve the irritation using: Wipes with medicine in them, such as adult wet wipes with aloe and vitamin E. A product to soothe skin, such as petroleum jelly. If you vomit while drinking the bowel prep: Take a break for up to 60 minutes. Begin the bowel prep again. Call your health care provider if you keep vomiting or you cannot take the bowel prep without vomiting. To clean out your colon, you may also be given: Laxative medicines. These help you have a bowel movement. Instructions for enema use. An enema is liquid medicine injected into your rectum. Medicines Ask your health care provider about: Changing or stopping your regular medicines or supplements. This is especially important if you are taking iron supplements, diabetes medicines, or blood thinners. Taking medicines such as aspirin and ibuprofen. These medicines can thin your blood. Do not take these medicines unless your health care provider tells you to take them. Taking over-the-counter medicines, vitamins, herbs, and supplements. General instructions Ask your health care provider what steps will be taken to help prevent infection. These may include washing skin with a germ-killing soap. If you will be going home right after the procedure, plan to have a responsible adult: Take you home from the hospital or clinic. You will not be  allowed to drive. Care for you for the time you are told. What happens during the procedure?  An IV will be inserted into one of your veins. You will be given a medicine to make you fall asleep (general anesthetic). You will lie on your side with your knees bent. A lubricant will be put on the tube. Then the tube will be: Inserted into your anus. Gently eased through all parts of your large intestine. Air will be sent into your colon to keep it open. This may cause some pressure or cramping. Images will be taken with the camera and will appear on a screen. A small tissue sample may be removed to be looked at under a microscope (biopsy). The tissue may be sent to a lab for testing if any signs of problems are found. If small polyps are found, they may be removed and checked for cancer cells. When the procedure is finished, the tube will be removed. The procedure may vary among health care providers and hospitals. What happens after the procedure? Your blood pressure, heart rate, breathing rate, and blood oxygen level will be monitored until you leave the  hospital or clinic. You may have a small amount of blood in your stool. You may pass gas and have mild cramping or bloating in your abdomen. This is caused by the air that was used to open your colon during the exam. If you were given a sedative during the procedure, it can affect you for several hours. Do not drive or operate machinery until your health care provider says that it is safe. It is up to you to get the results of your procedure. Ask your health care provider, or the department that is doing the procedure, when your results will be ready. Summary A colonoscopy is a procedure to look at the entire large intestine. Follow instructions from your health care provider about eating and drinking before the procedure. If you were prescribed an oral bowel prep to clean out your colon, take it as told by your health care provider. During  the colonoscopy, a flexible tube with a camera on its end is inserted into the anus and then passed into all parts of the large intestine. This information is not intended to replace advice given to you by your health care provider. Make sure you discuss any questions you have with your health care provider. Document Revised: 11/26/2022 Document Reviewed: 06/06/2021 Elsevier Patient Education  2024 [employer]  Upper Endoscopy, Adult Upper endoscopy is a procedure to look inside the upper GI (gastrointestinal) tract. The upper GI tract is made up of: The esophagus. This is the part of the body that moves food from your mouth to your stomach. The stomach. The duodenum. This is the first part of your small intestine. This procedure is also called esophagogastroduodenoscopy (EGD) or gastroscopy. In this procedure, your health care provider passes a thin, flexible tube (endoscope) through your mouth and down your esophagus into your stomach and into your duodenum. A small camera is attached to the end of the tube. Images from the camera appear on a monitor in the exam room. During this procedure, your health care provider may also remove a small piece of tissue to be sent to a lab and examined under a microscope (biopsy). Your health care provider may do an upper endoscopy to diagnose cancers of the upper GI tract. You may also have this procedure to find the cause of other conditions, such as: Stomach pain. Heartburn. Pain or problems when swallowing. Nausea and vomiting. Stomach bleeding. Stomach ulcers. Tell a health care provider about: Any allergies you have. All medicines you are taking, including vitamins, herbs, eye drops, creams, and over-the-counter medicines. Any problems you or family members have had with anesthetic medicines. Any bleeding problems you have. Any surgeries you have had. Any medical conditions you have. Whether you are pregnant or may be pregnant. What are the  risks? Your healthcare provider will talk with you about risks. These may include: Infection. Bleeding. Allergic reactions to medicines. A tear or hole (perforation) in the esophagus, stomach, or duodenum. What happens before the procedure? When to stop eating and drinking Follow instructions from your health care provider about what you may eat and drink. These may include: 8 hours before your procedure Stop eating most foods. Do not eat meat, fried foods, or fatty foods. Eat only light foods, such as toast or crackers. All liquids are okay except energy drinks and alcohol. 6 hours before your procedure Stop eating. Drink only clear liquids, such as water, clear fruit juice, black coffee, plain tea, and sports drinks. Do not drink energy drinks or  alcohol. 2 hours before your procedure Stop drinking all liquids. You may be allowed to take medicines with small sips of water. If you do not follow your health care provider's instructions, your procedure may be delayed or canceled. Medicines Ask your health care provider about: Changing or stopping your regular medicines. This is especially important if you are taking diabetes medicines or blood thinners. Taking medicines such as aspirin and ibuprofen. These medicines can thin your blood. Do not take these medicines unless your health care provider tells you to take them. Taking over-the-counter medicines, vitamins, herbs, and supplements. General instructions If you will be going home right after the procedure, plan to have a responsible adult: Take you home from the hospital or clinic. You will not be allowed to drive. Care for you for the time you are told. What happens during the procedure?  An IV will be inserted into one of your veins. You may be given one or more of the following: A medicine to help you relax (sedative). A medicine to numb the throat (local anesthetic). You will lie on your left side on an exam table. Your  health care provider will pass the endoscope through your mouth and down your esophagus. Your health care provider will use the scope to check the inside of your esophagus, stomach, and duodenum. Biopsies may be taken. The endoscope will be removed. The procedure may vary among health care providers and hospitals. What happens after the procedure? Your blood pressure, heart rate, breathing rate, and blood oxygen level will be monitored until you leave the hospital or clinic. When your throat is no longer numb, you may be given some fluids to drink. If you were given a sedative during the procedure, it can affect you for several hours. Do not drive or operate machinery until your health care provider says that it is safe. It is up to you to get the results of your procedure. Ask your health care provider, or the department that is doing the procedure, when your results will be ready. Contact a health care provider if you: Have a sore throat that lasts longer than 1 day. Have a fever. Get help right away if you: Vomit blood or your vomit looks like coffee grounds. Have bloody, black, or tarry stools. Have a very bad sore throat or you cannot swallow. Have difficulty breathing or very bad pain in your chest or abdomen. These symptoms may be an emergency. Get help right away. Call 911. Do not wait to see if the symptoms will go away. Do not drive yourself to the hospital. Summary Upper endoscopy is a procedure to look inside the upper GI tract. During the procedure, an IV will be inserted into one of your veins. You may be given a medicine to help you relax. The endoscope will be passed through your mouth and down your esophagus. Follow instructions from your health care provider about what you can eat and drink. This information is not intended to replace advice given to you by your health care provider. Make sure you discuss any questions you have with your health care provider. Document  Revised: 01/23/2022 Document Reviewed: 01/23/2022 Elsevier Patient Education  2024 [employer]  Monitored Anesthesia Care Anesthesia refers to the techniques, procedures, and medicines that help a person stay safe and comfortable during surgery. Monitored anesthesia care, or sedation, is one type of anesthesia. You may have sedation if you do not need to be asleep for your procedure. Procedures that use sedation  may include: Surgery to remove cataracts from your eyes. A dental procedure. A biopsy. This is when a tissue sample is removed and looked at under a microscope. You will be watched closely during your procedure. Your level of sedation or type of anesthesia may be changed to fit your needs. Tell a health care provider about: Any allergies you have. All medicines you are taking, including vitamins, herbs, eye drops, creams, and over-the-counter medicines. Any problems you or family members have had with anesthesia. Any bleeding problems you have. Any surgeries you have had. Any medical conditions or illnesses you have. This includes sleep apnea, cough, fever, or the flu. Whether you are pregnant or may be pregnant. Whether you use cigarettes, alcohol, or drugs. Any use of steroids, whether by mouth or as a cream. What are the risks? Your health care provider will talk with you about risks. These may include: Getting too much medicine (oversedation). Nausea. Allergic reactions to medicines. Trouble breathing. If this happens, a breathing tube may be used to help you breathe. It will be removed when you are awake and breathing on your own. Heart trouble. Lung trouble. Confusion that gets better with time (emergence delirium). What happens before the procedure? When to stop eating and drinking Follow instructions from your health care provider about what you may eat and drink. These may include: 8 hours before your procedure Stop eating most foods. Do not eat meat, fried foods,  or fatty foods. Eat only light foods, such as toast or crackers. All liquids are okay except energy drinks and alcohol. 6 hours before your procedure Stop eating. Drink only clear liquids, such as water, clear fruit juice, black coffee, plain tea, and sports drinks. Do not drink energy drinks or alcohol. 2 hours before your procedure Stop drinking all liquids. You may be allowed to take medicines with small sips of water. If you do not follow your health care provider's instructions, your procedure may be delayed or canceled. Medicines Ask your health care provider about: Changing or stopping your regular medicines. These include any diabetes medicines or blood thinners you take. Taking medicines such as aspirin and ibuprofen. These medicines can thin your blood. Do not take them unless your health care provider tells you to. Taking over-the-counter medicines, vitamins, herbs, and supplements. Testing You may have an exam or testing. You may have a blood or urine sample taken. General instructions Do not use any products that contain nicotine or tobacco for at least 4 weeks before the procedure. These products include cigarettes, chewing tobacco, and vaping devices, such as e-cigarettes. If you need help quitting, ask your health care provider. If you will be going home right after the procedure, plan to have a responsible adult: Take you home from the hospital or clinic. You will not be allowed to drive. Care for you for the time you are told. What happens during the procedure?  Your blood pressure, heart rate, breathing, level of pain, and blood oxygen level will be monitored. An IV will be inserted into one of your veins. You may be given: A sedative. This helps you relax. Anesthesia. This will: Numb certain areas of your body. Make you fall asleep for surgery. You will be given medicines as needed to keep you comfortable. The more medicine you are given, the deeper your level of  sedation will be. Your level of sedation may be changed to fit your needs. There are three levels of sedation: Mild sedation. At this level, you may feel  awake and relaxed. You will be able to follow directions. Moderate sedation. At this level, you will be sleepy. You may not remember the procedure. Deep sedation. At this level, you will be asleep. You will not remember the procedure. How you get the medicines will depend on your age and the procedure. They may be given as: A pill. This may be taken by mouth (orally) or inserted into the rectum. An injection. This may be into a vein or muscle. A spray through the nose. After your procedure is over, the medicine will be stopped. The procedure may vary among health care providers and hospitals. What happens after the procedure? Your blood pressure, heart rate, breathing rate, and blood oxygen level will be monitored until you leave the hospital or clinic. You may feel sleepy, clumsy, or nauseous. You may not remember what happened during or after the procedure. Sedation can affect you for several hours. Do not drive or use machinery until your health care provider says that it is safe. This information is not intended to replace advice given to you by your health care provider. Make sure you discuss any questions you have with your health care provider. Document Revised: 03/11/2022 Document Reviewed: 03/11/2022 Elsevier Patient Education  2024 [employer].

## 2023-09-15 ENCOUNTER — Telehealth: Payer: Self-pay | Admitting: *Deleted

## 2023-09-15 ENCOUNTER — Encounter (HOSPITAL_COMMUNITY)
Admission: RE | Admit: 2023-09-15 | Discharge: 2023-09-15 | Disposition: A | Discharge status: Home / Self Care | Payer: Self-pay | Source: Ambulatory Visit | Attending: Internal Medicine | Admitting: Internal Medicine

## 2023-09-15 DIAGNOSIS — K759 Inflammatory liver disease, unspecified: Secondary | ICD-10-CM

## 2023-09-15 NOTE — Telephone Encounter (Signed)
noted 

## 2023-09-15 NOTE — Telephone Encounter (Signed)
Pt called to cancel his procedure on 09/17/23 due to being sick. Advised pt will call once we get providers schedule for January to get him rescheduled. FYI

## 2023-09-16 ENCOUNTER — Other Ambulatory Visit: Payer: Self-pay | Admitting: Physician Assistant

## 2023-09-16 DIAGNOSIS — Z125 Encounter for screening for malignant neoplasm of prostate: Secondary | ICD-10-CM

## 2023-09-16 DIAGNOSIS — E785 Hyperlipidemia, unspecified: Secondary | ICD-10-CM

## 2023-09-17 ENCOUNTER — Ambulatory Visit (HOSPITAL_COMMUNITY): Admission: RE | Admit: 2023-09-17 | Payer: Self-pay | Source: Ambulatory Visit | Admitting: Internal Medicine

## 2023-09-17 ENCOUNTER — Encounter (HOSPITAL_COMMUNITY): Admission: RE | Payer: Self-pay | Source: Ambulatory Visit

## 2023-09-17 SURGERY — COLONOSCOPY WITH PROPOFOL
Anesthesia: Monitor Anesthesia Care

## 2023-10-01 ENCOUNTER — Encounter: Payer: Self-pay | Admitting: Physician Assistant

## 2023-10-01 ENCOUNTER — Ambulatory Visit: Payer: Self-pay | Admitting: Physician Assistant

## 2023-10-01 VITALS — BP 131/72 | HR 85 | Temp 97.7°F | Ht 72.0 in | Wt 133.0 lb

## 2023-10-01 DIAGNOSIS — R1013 Epigastric pain: Secondary | ICD-10-CM

## 2023-10-01 DIAGNOSIS — E785 Hyperlipidemia, unspecified: Secondary | ICD-10-CM

## 2023-10-01 DIAGNOSIS — J449 Chronic obstructive pulmonary disease, unspecified: Secondary | ICD-10-CM

## 2023-10-01 DIAGNOSIS — K219 Gastro-esophageal reflux disease without esophagitis: Secondary | ICD-10-CM

## 2023-10-01 DIAGNOSIS — F424 Excoriation (skin-picking) disorder: Secondary | ICD-10-CM

## 2023-10-01 DIAGNOSIS — L309 Dermatitis, unspecified: Secondary | ICD-10-CM

## 2023-10-01 MED ORDER — ATORVASTATIN CALCIUM 40 MG PO TABS: ORAL_TABLET | ORAL | 0 refills | Status: DC

## 2023-10-01 MED ORDER — FLUTICASONE-SALMETEROL 100-50 MCG/ACT IN AEPB: INHALATION_SPRAY | RESPIRATORY_TRACT | 0 refills | Status: DC

## 2023-10-01 MED ORDER — ALBUTEROL SULFATE HFA 108 (90 BASE) MCG/ACT IN AERS: INHALATION_SPRAY | RESPIRATORY_TRACT | 0 refills | Status: DC

## 2023-10-01 MED ORDER — AMLODIPINE BESYLATE 2.5 MG PO TABS: ORAL_TABLET | ORAL | 0 refills | Status: DC

## 2023-10-01 MED ORDER — HYDROCORTISONE 2.5 % EX OINT: TOPICAL_OINTMENT | Freq: Two times a day (BID) | CUTANEOUS | 1 refills | Status: DC

## 2023-10-01 MED ORDER — OMEPRAZOLE 40 MG PO CPDR: 40.0000 mg | DELAYED_RELEASE_CAPSULE | Freq: Two times a day (BID) | ORAL | 0 refills | Status: DC

## 2023-10-01 NOTE — Progress Notes (Signed)
BP 131/72   Pulse 85   Temp 97.7 F (36.5 C)   Ht 6' (1.829 m)   Wt 133 lb (60.3 kg)   SpO2 98%   BMI 18.04 kg/m    Subjective:    Patient ID: Edgar Roberts, male    DOB: 06-10-1961, 62 y.o.   MRN: [id number]  HPI: LEVIN PULLING is a 62 y.o. male presenting on 10/01/2023 for Hyperlipidemia and COPD   HPI   He has stomach pain most days of the week.  He was scheduled for GI testing in November but got sick.  He says they're supposed to r/s for juanary.  He is taking the omeprazole 20mg  every day  He hasn't gotten labs drawn yet  He says his breathing is stable.  He says the advair helped.    Relevant past medical, surgical, family and social history reviewed and updated as indicated. Interim medical history since our last visit reviewed. Allergies and medications reviewed and updated.   Current Outpatient Medications:    albuterol (VENTOLIN HFA) 108 (90 Base) MCG/ACT inhaler, INHALE 2 PUFFS BY MOUTH EVERY 6 HOURS AS NEEDED FOR COUGHING, WHEEZING, OR SHORTNESS OF BREATH, Disp: 3 each, Rfl: 0   amLODipine (NORVASC) 2.5 MG tablet, TAKE 1 Tablet BY MOUTH ONCE EVERY DAY, Disp: 90 tablet, Rfl: 0   aspirin EC 81 MG tablet, Take 1 tablet (81 mg total) by mouth daily., Disp: 90 tablet, Rfl: 3   atorvastatin (LIPITOR) 40 MG tablet, TAKE 1 Tablet BY MOUTH ONCE EVERY DAY, Disp: 90 tablet, Rfl: 0   Blood Pressure Monitor DEVI, 1 each by Does not apply route daily., Disp: 1 each, Rfl: 0   fluticasone-salmeterol (ADVAIR) 100-50 MCG/ACT AEPB, INHALE 1 PUFF BY MOUTH TWICE DAILY (EVERY 12 HOURS). RINSE MOUTH AFTER USE., Disp: 180 each, Rfl: 0   nitroGLYCERIN (NITROSTAT) 0.4 MG SL tablet, Place 1 tablet (0.4 mg total) under the tongue every 5 (five) minutes as needed for chest pain. May repeat for total 3 doses.  If still having CP, go to ER, Disp: 25 tablet, Rfl: PRN   omeprazole (PRILOSEC) 20 MG capsule, TAKE 1 Capsule BY MOUTH ONCE EVERY DAY BEFORE BREAKFAST, Disp: 90 capsule, Rfl:  0   Review of Systems  Per HPI unless specifically indicated above     Objective:    BP 131/72   Pulse 85   Temp 97.7 F (36.5 C)   Ht 6' (1.829 m)   Wt 133 lb (60.3 kg)   SpO2 98%   BMI 18.04 kg/m   Wt Readings from Last 3 Encounters:  10/01/23 133 lb (60.3 kg)  07/21/23 132 lb 9.6 oz (60.1 kg)  07/14/23 132 lb 6.4 oz (60.1 kg)    Physical Exam Vitals reviewed.  Constitutional:      General: He is not in acute distress.    Appearance: He is well-developed. He is not toxic-appearing.  HENT:     Head: Normocephalic and atraumatic.  Cardiovascular:     Rate and Rhythm: Normal rate and regular rhythm.  Pulmonary:     Effort: Pulmonary effort is normal.     Breath sounds: Normal breath sounds. No wheezing.  Abdominal:     General: Bowel sounds are normal.     Palpations: Abdomen is soft. There is no mass.     Tenderness: There is no abdominal tenderness. There is no guarding or rebound.  Musculoskeletal:     Cervical back: Neck supple.     Right  lower leg: No edema.     Left lower leg: No edema.  Lymphadenopathy:     Cervical: No cervical adenopathy.  Skin:    General: Skin is warm and dry.     Comments: Skin on B hands dry cracking with excoriation.  No secondary infection  Neurological:     Mental Status: He is alert and oriented to person, place, and time.  Psychiatric:        Behavior: Behavior normal.              Assessment & Plan:    Encounter Diagnoses  Name Primary?   Abdominal pain, epigastric Yes   Gastroesophageal reflux disease, unspecified whether esophagitis present    Hyperlipidemia, unspecified hyperlipidemia type    Chronic obstructive pulmonary disease, unspecified COPD type (HCC)    Dermatitis    Picking own skin      -pt reminded to get labs drawn.  He will be called with results -pt reminded to renew cafa -increased omeprazole from 20mg  every day to 40mg  bid.  Pt encouraged to continue with GI and get testing  rescheduled -add hydrocortisone ointment for hands. Pt encouraged to avoid picking -pt to continue other meds -he is to follow up 3 months.  He should contact office sooner prn

## 2023-12-11 ENCOUNTER — Other Ambulatory Visit: Payer: Self-pay | Admitting: Physician Assistant

## 2023-12-15 ENCOUNTER — Other Ambulatory Visit: Payer: Self-pay | Admitting: Physician Assistant

## 2023-12-15 DIAGNOSIS — E785 Hyperlipidemia, unspecified: Secondary | ICD-10-CM

## 2023-12-15 DIAGNOSIS — Z125 Encounter for screening for malignant neoplasm of prostate: Secondary | ICD-10-CM

## 2023-12-29 ENCOUNTER — Other Ambulatory Visit (HOSPITAL_COMMUNITY)
Admission: RE | Admit: 2023-12-29 | Discharge: 2023-12-29 | Disposition: A | Discharge status: Home / Self Care | Payer: Self-pay | Source: Ambulatory Visit | Attending: Physician Assistant | Admitting: Physician Assistant

## 2023-12-29 DIAGNOSIS — E785 Hyperlipidemia, unspecified: Secondary | ICD-10-CM | POA: Insufficient documentation

## 2023-12-29 DIAGNOSIS — Z125 Encounter for screening for malignant neoplasm of prostate: Secondary | ICD-10-CM | POA: Insufficient documentation

## 2023-12-29 LAB — LIPID PANEL
Cholesterol: 162 mg/dL (ref 0–200)
HDL: 26 mg/dL — ABNORMAL LOW (ref >40)
LDL Cholesterol: 116 mg/dL — ABNORMAL HIGH (ref 0–99)
Total CHOL/HDL Ratio: 6.2 ratio
Triglycerides: 101 mg/dL (ref <150)
VLDL: 20 mg/dL (ref 0–40)

## 2023-12-29 LAB — COMPREHENSIVE METABOLIC PANEL
ALT: 13 U/L (ref 0–44)
AST: 18 U/L (ref 15–41)
Albumin: 4.2 g/dL (ref 3.5–5.0)
Alkaline Phosphatase: 72 U/L (ref 38–126)
Anion gap: 10 (ref 5–15)
BUN: 10 mg/dL (ref 8–23)
CO2: 22 mmol/L (ref 22–32)
Calcium: 9.3 mg/dL (ref 8.9–10.3)
Chloride: 102 mmol/L (ref 98–111)
Creatinine, Ser: 0.77 mg/dL (ref 0.61–1.24)
GFR, Estimated: >60 mL/min (ref >60)
Glucose, Bld: 119 mg/dL — ABNORMAL HIGH (ref 70–99)
Potassium: 3.8 mmol/L (ref 3.5–5.1)
Sodium: 134 mmol/L — ABNORMAL LOW (ref 135–145)
Total Bilirubin: 0.9 mg/dL (ref 0.0–1.2)
Total Protein: 8.8 g/dL — ABNORMAL HIGH (ref 6.5–8.1)

## 2023-12-29 LAB — PSA: Prostatic Specific Antigen: 0.47 ng/mL (ref 0.00–4.00)

## 2023-12-30 ENCOUNTER — Encounter: Payer: Self-pay | Admitting: Physician Assistant

## 2023-12-30 ENCOUNTER — Ambulatory Visit: Payer: Self-pay | Admitting: Physician Assistant

## 2023-12-30 ENCOUNTER — Ambulatory Visit (HOSPITAL_COMMUNITY)
Admission: RE | Admit: 2023-12-30 | Discharge: 2023-12-30 | Disposition: A | Discharge status: Home / Self Care | Payer: Self-pay | Source: Ambulatory Visit | Attending: Physician Assistant | Admitting: Physician Assistant

## 2023-12-30 VITALS — BP 140/90 | HR 113 | Temp 97.8°F | Ht 72.0 in | Wt 129.8 lb

## 2023-12-30 DIAGNOSIS — F424 Excoriation (skin-picking) disorder: Secondary | ICD-10-CM

## 2023-12-30 DIAGNOSIS — R051 Acute cough: Secondary | ICD-10-CM

## 2023-12-30 DIAGNOSIS — J449 Chronic obstructive pulmonary disease, unspecified: Secondary | ICD-10-CM | POA: Insufficient documentation

## 2023-12-30 DIAGNOSIS — I10 Essential (primary) hypertension: Secondary | ICD-10-CM

## 2023-12-30 DIAGNOSIS — R509 Fever, unspecified: Secondary | ICD-10-CM | POA: Insufficient documentation

## 2023-12-30 DIAGNOSIS — E785 Hyperlipidemia, unspecified: Secondary | ICD-10-CM

## 2023-12-30 MED ORDER — FLUTICASONE-SALMETEROL 232-14 MCG/ACT IN AEPB: 1.0000 | INHALATION_SPRAY | Freq: Two times a day (BID) | RESPIRATORY_TRACT | Status: DC

## 2023-12-30 MED ORDER — AMLODIPINE BESYLATE 5 MG PO TABS: 5.0000 mg | ORAL_TABLET | Freq: Every day | ORAL | 1 refills | Status: DC

## 2023-12-30 MED ORDER — ALBUTEROL SULFATE HFA 108 (90 BASE) MCG/ACT IN AERS: INHALATION_SPRAY | RESPIRATORY_TRACT | 0 refills | Status: DC

## 2023-12-30 MED ORDER — OMEPRAZOLE 40 MG PO CPDR: 40.0000 mg | DELAYED_RELEASE_CAPSULE | Freq: Two times a day (BID) | ORAL | 0 refills | Status: DC

## 2023-12-30 MED ORDER — FLUTICASONE FUROATE-VILANTEROL 100-25 MCG/ACT IN AEPB: 1.0000 | INHALATION_SPRAY | Freq: Every day | RESPIRATORY_TRACT | 1 refills | Status: DC

## 2023-12-30 MED ORDER — ATORVASTATIN CALCIUM 40 MG PO TABS: ORAL_TABLET | ORAL | 0 refills | Status: DC

## 2023-12-30 NOTE — Progress Notes (Signed)
 BP (!) 165/94   Pulse (!) 120   Temp 97.6 F (36.4 C)   Ht 6' (1.829 m)   Wt 129 lb 12 oz (58.9 kg)   SpO2 98%   BMI 17.60 kg/m    Subjective:    Patient ID: Edgar Roberts, male    DOB: 1960-12-10, 63 y.o.   MRN: [id number]  HPI: DEL OVERFELT is a 63 y.o. male presenting on 12/30/2023 for COPD and Hyperlipidemia   HPI  Chief Complaint  Patient presents with   COPD   Hyperlipidemia     Pt has been sick but he is feeling better.  Says he couldn't get warm for several days.   He had emesis, high fever, body aches and breathing was "rough"  Feels like yesterday was last day he had fever.  First sick on feb 24.    Relevant past medical, surgical, family and social history reviewed and updated as indicated. Interim medical history since our last visit reviewed. Allergies and medications reviewed and updated.   Current Outpatient Medications:    albuterol (VENTOLIN HFA) 108 (90 Base) MCG/ACT inhaler, INHALE 2 PUFFS BY MOUTH EVERY 6 HOURS AS NEEDED FOR COUGHING, WHEEZING, OR SHORTNESS OF BREATH, Disp: 20.1 g, Rfl: 0   amLODipine (NORVASC) 2.5 MG tablet, TAKE 1 Tablet BY MOUTH ONCE EVERY DAY, Disp: 90 tablet, Rfl: 0   aspirin EC 81 MG tablet, Take 1 tablet (81 mg total) by mouth daily., Disp: 90 tablet, Rfl: 3   atorvastatin (LIPITOR) 40 MG tablet, TAKE 1 Tablet BY MOUTH ONCE EVERY DAY, Disp: 90 tablet, Rfl: 0   Blood Pressure Monitor DEVI, 1 each by Does not apply route daily., Disp: 1 each, Rfl: 0   hydrocortisone 2.5 % ointment, APPLY TO THE AFFECTED AREA TOPICALLY TWICE DAILY, Disp: 28.35 g, Rfl: 0   nitroGLYCERIN (NITROSTAT) 0.4 MG SL tablet, Place 1 tablet (0.4 mg total) under the tongue every 5 (five) minutes as needed for chest pain. May repeat for total 3 doses.  If still having CP, go to ER, Disp: 25 tablet, Rfl: PRN   omeprazole (PRILOSEC) 40 MG capsule, TAKE 1 Capsule  BY MOUTH TWICE DAILY, Disp: 180 capsule, Rfl: 0   fluticasone-salmeterol (ADVAIR) 100-50  MCG/ACT AEPB, INHALE 1 PUFF BY MOUTH TWICE DAILY (EVERY 12 HOURS). RINSE MOUTH AFTER USE. (Patient not taking: Reported on 12/30/2023), Disp: 180 each, Rfl: 0    Review of Systems  Per HPI unless specifically indicated above     Objective:    BP (!) 165/94   Pulse (!) 120   Temp 97.6 F (36.4 C)   Ht 6' (1.829 m)   Wt 129 lb 12 oz (58.9 kg)   SpO2 98%   BMI 17.60 kg/m   Wt Readings from Last 3 Encounters:  12/30/23 129 lb 12 oz (58.9 kg)  10/01/23 133 lb (60.3 kg)  07/21/23 132 lb 9.6 oz (60.1 kg)     Vitals:   12/30/23 0927 12/30/23 1044  BP: (!) 165/94 (!) 140/90  Pulse: (!) 120 (!) 113  Temp: 97.6 F (36.4 C) 97.8 F (36.6 C)  SpO2: 98% 99%      Physical Exam Vitals reviewed.  Constitutional:      General: He is not in acute distress.    Appearance: He is well-developed. He is not toxic-appearing.  HENT:     Head: Normocephalic and atraumatic.  Cardiovascular:     Rate and Rhythm: Normal rate and regular rhythm.  Pulmonary:  Effort: Pulmonary effort is normal.     Breath sounds: Normal breath sounds. No wheezing.  Abdominal:     General: Bowel sounds are normal.     Palpations: Abdomen is soft.     Tenderness: There is no abdominal tenderness.  Musculoskeletal:     Cervical back: Neck supple.  Lymphadenopathy:     Cervical: No cervical adenopathy.  Skin:    General: Skin is warm and dry.     Comments: Many excoriations BUE. No secondary infections  Neurological:     Mental Status: He is alert and oriented to person, place, and time.  Psychiatric:        Behavior: Behavior normal.     Results for orders placed or performed during the hospital encounter of 12/29/23  PSA   Collection Time: 12/29/23  9:15 AM  Result Value Ref Range   Prostatic Specific Antigen 0.47 0.00 - 4.00 ng/mL  Lipid panel   Collection Time: 12/29/23  9:15 AM  Result Value Ref Range   Cholesterol 162 0 - 200 mg/dL   Triglycerides 409 <811 mg/dL   HDL 26 (L) >91 mg/dL    Total CHOL/HDL Ratio 6.2 RATIO   VLDL 20 0 - 40 mg/dL   LDL Cholesterol 478 (H) 0 - 99 mg/dL  Comprehensive metabolic panel   Collection Time: 12/29/23  9:15 AM  Result Value Ref Range   Sodium 134 (L) 135 - 145 mmol/L   Potassium 3.8 3.5 - 5.1 mmol/L   Chloride 102 98 - 111 mmol/L   CO2 22 22 - 32 mmol/L   Glucose, Bld 119 (H) 70 - 99 mg/dL   BUN 10 8 - 23 mg/dL   Creatinine, Ser 2.95 0.61 - 1.24 mg/dL   Calcium 9.3 8.9 - 62.1 mg/dL   Total Protein 8.8 (H) 6.5 - 8.1 g/dL   Albumin 4.2 3.5 - 5.0 g/dL   AST 18 15 - 41 U/L   ALT 13 0 - 44 U/L   Alkaline Phosphatase 72 38 - 126 U/L   Total Bilirubin 0.9 0.0 - 1.2 mg/dL   GFR, Estimated >30 >86 mL/min   Anion gap 10 5 - 15      Assessment & Plan:    Encounter Diagnoses  Name Primary?   Acute cough Yes   Fever, unspecified fever cause    Chronic obstructive pulmonary disease, unspecified COPD type (HCC)    Hyperlipidemia, unspecified hyperlipidemia type    Picking own skin    Primary hypertension      HTN -increase amlodipine to 5mg  every day and recheck in one month  Dyslipidemia -reviewed labs with pt -likely up due to not taking meds while sick -continue atorvastatin 40mg  and lowfat diet  COPD -Gave sample airduo to use until rx Breo can come in from [facility].  -albuterol MRI prn  -URI -pt sent for CXR -no pneumonia -pt counseled to continue to rest and treat symptomatically for 2 days more.  He is given note to RTW on Thursday 01/01/24

## 2024-01-20 ENCOUNTER — Encounter: Payer: Self-pay | Admitting: Cardiology

## 2024-01-20 ENCOUNTER — Ambulatory Visit: Payer: Self-pay | Attending: Cardiology | Admitting: Cardiology

## 2024-01-20 VITALS — BP 102/60 | HR 70 | Ht 72.0 in | Wt 134.0 lb

## 2024-01-20 DIAGNOSIS — R0789 Other chest pain: Secondary | ICD-10-CM

## 2024-01-20 DIAGNOSIS — E782 Mixed hyperlipidemia: Secondary | ICD-10-CM

## 2024-01-20 DIAGNOSIS — I10 Essential (primary) hypertension: Secondary | ICD-10-CM

## 2024-01-20 NOTE — Progress Notes (Signed)
 Clinical Summary Mr. Dolin is a 63 y.o.male seen today for follow up of the following medical problems.      1. Chest pain - started 3-4 months ago. "Funny feeling" left chest and radiates into arm and hand. Tends to occur with activity. Can cause some diaphoresis and nausea. + SOB. Can be worst with deep breaths. No relation to food. Lasts about 10-20 minutes. Occurs 3 times a week. - notes some generalized fatigue. Notes some DOE with walking uphill - no LE edema, no orthopnea   CAD risk factors: tobacco, sister with "open heart surgeries" and stents.     Jan 2021 nuclear stress: inferior/inferoseptal infarct, no current ischemia. Low risk study     - denies any chest pains, no recent SOb/DOE    2.Hyperlipiddemia  - 12/2021 TC 136 TG 71 hDL 35 LDL 87 - 08/2022 TC 125 TG 54 HDL 36 LDL 78 - 12/2023 TC 086 TG 578 HDL 26 LDL 116. Reports recent GI illness did not take statin regularly.  - compliant with statin   3. HTN - he is compliant with m eds     4.Syncope - 05/2023 echo: LVEF 55-60%, no WMAs, grade I dd.  - 05/2023 14 day monitor: rare ectopy, 3 runs NSVT longest 7 beats - episode thought to be vasovagal - no recurrent symptoms.    5. Remote tobacco - just smoked for years as teenager.     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 Past Medical History:  Diagnosis Date   Asthma    Chronic hepatitis C (HCC)    treated, F3/F4 needs surveillance   COPD (chronic obstructive pulmonary disease) (HCC)    GERD (gastroesophageal reflux disease)      Allergies  Allergen Reactions   Bee Venom Swelling    Bees and yellow Jackets   Other Rash    Poison oak     Current Outpatient Medications  Medication Sig Dispense Refill   albuterol (VENTOLIN HFA) 108 (90 Base) MCG/ACT inhaler INHALE 2 PUFFS BY MOUTH EVERY 6 HOURS AS NEEDED FOR COUGHING, WHEEZING, OR SHORTNESS OF BREATH 20.1 g 0   amLODipine (NORVASC) 5 MG tablet Take 1 tablet (5 mg total) by mouth daily. 90  tablet 1   aspirin EC 81 MG tablet Take 1 tablet (81 mg total) by mouth daily. 90 tablet 3   atorvastatin (LIPITOR) 40 MG tablet TAKE 1 Tablet BY MOUTH ONCE EVERY DAY 90 tablet 0   Blood Pressure Monitor DEVI 1 each by Does not apply route daily. 1 each 0   fluticasone furoate-vilanterol (BREO ELLIPTA) 100-25 MCG/ACT AEPB Inhale 1 puff into the lungs daily. 3 each 1   Fluticasone-Salmeterol (AIRDUO RESPICLICK 232/14) 232-14 MCG/ACT AEPB Inhale 1 puff into the lungs 2 (two) times daily.     hydrocortisone 2.5 % ointment APPLY TO THE AFFECTED AREA TOPICALLY TWICE DAILY 28.35 g 0   nitroGLYCERIN (NITROSTAT) 0.4 MG SL tablet Place 1 tablet (0.4 mg total) under the tongue every 5 (five) minutes as needed for chest pain. May repeat for total 3 doses.  If still having CP, go to ER 25 tablet PRN   omeprazole (PRILOSEC) 40 MG capsule Take 1 capsule (40 mg total) by mouth 2 (two) times daily. 180 capsule 0   No current facility-administered medications for this visit.     Past Surgical History:  Procedure Laterality Date   BIOPSY N/A 09/04/2015   Procedure: BIOPSY;  Surgeon: Corbin Ade, MD;  Location: [facility];  Service: Endoscopy;  Laterality: N/A;  gastric   ESOPHAGEAL DILATION N/A 09/04/2015   Procedure:  ESOPHAGEAL DILATION;  Surgeon: Corbin Ade, MD;  Location: [facility];  Service: Endoscopy;  Laterality: N/A;  maloney 56   ESOPHAGOGASTRODUODENOSCOPY (EGD) WITH PROPOFOL N/A 09/04/2015   Dr. Jena Gauss: mild erosive reflux esophagitis. H.pylori gastritis   INGUINAL HERNIA REPAIR Right 06/14/2022   Procedure: HERNIA REPAIR INGUINAL ADULT;  Surgeon: Lucretia Roers, MD;  Location: [facility];  Service: General;  Laterality: Right;   none       Allergies  Allergen Reactions   Bee Venom Swelling    Bees and yellow Jackets   Other Rash    Poison oak      Family History  Problem Relation Age of Onset   Cirrhosis Father        deceased   Heart disease Sister        3 open heart surgeries    Heart disease Mother    Heart disease Other        grandmother   Colon cancer Neg Hx      Social History Mr. Runkles reports that he quit smoking about 41 years ago. His smoking use included cigarettes. He started smoking about 47 years ago. He has a 12.8 pack-year smoking history. He has never used smokeless tobacco. Mr. Erman reports no history of alcohol use.     Physical Examination Today's Vitals   01/20/24 1403  BP: 102/60  Pulse: 70  SpO2: 97%  Weight: 134 lb (60.8 kg)  Height: 6' (1.829 m)   Body mass index is 18.17 kg/m.  Gen: resting comfortably, no acute distress HEENT: no scleral icterus, pupils equal round and reactive, no palptable cervical adenopathy,  CV: RRR, no m/rg, no jvd Resp: Clear to auscultation bilaterally GI: abdomen is soft, non-tender, non-distended, normal bowel sounds, no hepatosplenomegaly MSK: extremities are warm, no edema.  Skin: warm, no rash Neuro:  no focal deficits Psych: appropriate affect   Diagnostic Studies  06/2015 GXT There was no ST segment deviation noted during stress.   Clinically and electrically negative for ischemia.  Very good exercise tolerance     Jan 2021 nuclear stress test There was no ST segment deviation noted during stress. 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 This is a low risk study. There is no current myocardium at jeopardy The left ventricular ejection fraction is low normal (50%).   Assessment and Plan  1. Chest pain - nuclear stress without current ischemia, possible old inferior infarct - denies any symptoms, cotinue to monitor   2. HTN - he is at goal, continue current meds   3.Hyperlipidemia - uptrend in LDL, reports had missed his statin for a few weeks during a significnat GI illness. Back on regularly now, would just monitor. LDL had done well last 2 years on current regimen.    F/u 1 year  Antoine Poche, M.D.,

## 2024-01-20 NOTE — Patient Instructions (Signed)

## 2024-01-29 ENCOUNTER — Ambulatory Visit: Payer: Self-pay | Admitting: Physician Assistant

## 2024-02-04 ENCOUNTER — Ambulatory Visit: Payer: Self-pay | Admitting: Physician Assistant

## 2024-02-04 ENCOUNTER — Encounter: Payer: Self-pay | Admitting: Physician Assistant

## 2024-02-04 VITALS — BP 107/67 | HR 70 | Temp 97.4°F | Ht 72.0 in | Wt 134.8 lb

## 2024-02-04 DIAGNOSIS — E785 Hyperlipidemia, unspecified: Secondary | ICD-10-CM

## 2024-02-04 DIAGNOSIS — F424 Excoriation (skin-picking) disorder: Secondary | ICD-10-CM

## 2024-02-04 DIAGNOSIS — R9389 Abnormal findings on diagnostic imaging of other specified body structures: Secondary | ICD-10-CM

## 2024-02-04 DIAGNOSIS — I10 Essential (primary) hypertension: Secondary | ICD-10-CM

## 2024-02-04 DIAGNOSIS — J449 Chronic obstructive pulmonary disease, unspecified: Secondary | ICD-10-CM

## 2024-02-04 DIAGNOSIS — K219 Gastro-esophageal reflux disease without esophagitis: Secondary | ICD-10-CM

## 2024-02-04 DIAGNOSIS — R918 Other nonspecific abnormal finding of lung field: Secondary | ICD-10-CM

## 2024-02-04 NOTE — Patient Instructions (Signed)
 Call GI to reschedule test [phone]

## 2024-02-04 NOTE — Progress Notes (Signed)
 BP 107/67   Pulse 70   Temp (!) 97.4 F (36.3 C)   Ht 6' (1.829 m)   Wt 134 lb 12 oz (61.1 kg)   SpO2 99%   BMI 18.28 kg/m    Subjective:    Patient ID: Edgar Roberts, male    DOB: [birth year], 63 y.o.   MRN: [id number]  HPI: Edgar Roberts is a 63 y.o. male presenting on 02/04/2024 for Hypertension and COPD   HPI  Chief Complaint  Patient presents with   Hypertension   COPD   Pt says he is feeling well.    Pt says he forgot to call GI to reschedule his test (that he cancelled due to being sick).  He says his GI symptoms improved since increasing omeprazole to 40 bid  He says his breathing is okay.  He has no complaints.      Relevant past medical, surgical, family and social history reviewed and updated as indicated. Interim medical history since our last visit reviewed. Allergies and medications reviewed and updated.   Current Outpatient Medications:    albuterol (VENTOLIN HFA) 108 (90 Base) MCG/ACT inhaler, INHALE 2 PUFFS BY MOUTH EVERY 6 HOURS AS NEEDED FOR COUGHING, WHEEZING, OR SHORTNESS OF BREATH, Disp: 20.1 g, Rfl: 0   amLODipine (NORVASC) 5 MG tablet, Take 1 tablet (5 mg total) by mouth daily., Disp: 90 tablet, Rfl: 1   aspirin EC 81 MG tablet, Take 1 tablet (81 mg total) by mouth daily., Disp: 90 tablet, Rfl: 3   atorvastatin (LIPITOR) 40 MG tablet, TAKE 1 Tablet BY MOUTH ONCE EVERY DAY, Disp: 90 tablet, Rfl: 0   Blood Pressure Monitor DEVI, 1 each by Does not apply route daily., Disp: 1 each, Rfl: 0   Fluticasone-Salmeterol (AIRDUO RESPICLICK 232/14) 232-14 MCG/ACT AEPB, Inhale 1 puff into the lungs 2 (two) times daily., Disp: , Rfl:    hydrocortisone 2.5 % ointment, APPLY TO THE AFFECTED AREA TOPICALLY TWICE DAILY, Disp: 28.35 g, Rfl: 0   omeprazole (PRILOSEC) 40 MG capsule, Take 1 capsule (40 mg total) by mouth 2 (two) times daily., Disp: 180 capsule, Rfl: 0   fluticasone furoate-vilanterol (BREO ELLIPTA) 100-25 MCG/ACT AEPB, Inhale 1 puff into the  lungs daily. (Patient not taking: Reported on 02/04/2024), Disp: 3 each, Rfl: 1   nitroGLYCERIN (NITROSTAT) 0.4 MG SL tablet, Place 1 tablet (0.4 mg total) under the tongue every 5 (five) minutes as needed for chest pain. May repeat for total 3 doses.  If still having CP, go to ER (Patient not taking: Reported on 02/04/2024), Disp: 25 tablet, Rfl: PRN    Review of Systems  Per HPI unless specifically indicated above     Objective:    BP 107/67   Pulse 70   Temp (!) 97.4 F (36.3 C)   Ht 6' (1.829 m)   Wt 134 lb 12 oz (61.1 kg)   SpO2 99%   BMI 18.28 kg/m   Wt Readings from Last 3 Encounters:  02/04/24 134 lb 12 oz (61.1 kg)  01/20/24 134 lb (60.8 kg)  12/30/23 129 lb 12 oz (58.9 kg)    Physical Exam Vitals reviewed.  Constitutional:      General: He is not in acute distress.    Appearance: He is not toxic-appearing.  HENT:     Head: Normocephalic and atraumatic.  Cardiovascular:     Rate and Rhythm: Normal rate and regular rhythm.  Pulmonary:     Effort: Pulmonary effort is normal. No  respiratory distress.     Breath sounds: Normal breath sounds. No wheezing.  Abdominal:     General: Bowel sounds are normal.     Palpations: Abdomen is soft.     Tenderness: There is no abdominal tenderness.  Musculoskeletal:     Cervical back: Neck supple.     Right lower leg: No edema.     Left lower leg: No edema.  Lymphadenopathy:     Cervical: No cervical adenopathy.  Skin:    General: Skin is warm and dry.  Neurological:     Mental Status: He is alert and oriented to person, place, and time.  Psychiatric:        Behavior: Behavior normal. Behavior is cooperative.         Assessment & Plan:   Encounter Diagnoses  Name Primary?   Primary hypertension Yes   Hyperlipidemia, unspecified hyperlipidemia type    Chronic obstructive pulmonary disease, unspecified COPD type (HCC)    Picking own skin    Gastroesophageal reflux disease, unspecified whether esophagitis present     Pulmonary nodules/lesions, multiple    Abnormal CT of the chest      -will Check on cafa for pt and let him know if it's still active (he thinks that it is) -Pt to call GI to reschedule his test.  He is given the phone number Will Schedule chest CT f/u pulmonary nodules per recommendation.  Last CT done December 2023 -pt to continue current medications -pt to follow up 4 months.  He is to contact office sooner prn

## 2024-02-16 ENCOUNTER — Ambulatory Visit (HOSPITAL_COMMUNITY): Admission: RE | Admit: 2024-02-16 | Payer: Self-pay | Source: Ambulatory Visit

## 2024-04-05 ENCOUNTER — Ambulatory Visit: Payer: Self-pay | Admitting: Physician Assistant

## 2024-04-05 VITALS — Temp 98.1°F

## 2024-04-05 DIAGNOSIS — F424 Excoriation (skin-picking) disorder: Secondary | ICD-10-CM

## 2024-04-05 DIAGNOSIS — L089 Local infection of the skin and subcutaneous tissue, unspecified: Secondary | ICD-10-CM

## 2024-04-05 MED ORDER — SULFAMETHOXAZOLE-TRIMETHOPRIM 800-160 MG PO TABS
1.0000 | ORAL_TABLET | Freq: Two times a day (BID) | ORAL | 0 refills | Status: AC
Start: 2024-04-05 — End: 2024-04-12

## 2024-04-05 NOTE — Patient Instructions (Signed)
 Soap- dove or lever

## 2024-04-05 NOTE — Progress Notes (Signed)
   Temp 98.1 F (36.7 C)    Subjective:    Patient ID: Edgar Roberts, male    DOB: 1961-08-11, 63 y.o.   MRN: [id number]  HPI: Edgar Roberts is a 63 y.o. male presenting on 04/05/2024 for No chief complaint on file.   HPI   Pt c/o painful oozing sores on arms and legs.  He has been a picker since before becoming a pt at [facility] in 2014.  Relevant past medical, surgical, family and social history reviewed and updated as indicated. Interim medical history since our last visit reviewed. Allergies and medications reviewed and updated.  Review of Systems  Per HPI unless specifically indicated above     Objective:     Temp 98.1 F (36.7 C)   Wt Readings from Last 3 Encounters:  02/04/24 134 lb 12 oz (61.1 kg)  01/20/24 134 lb (60.8 kg)  12/30/23 129 lb 12 oz (58.9 kg)     BP deferred due to extensive wounds/infection   Physical Exam Constitutional:      General: He is not in acute distress.    Appearance: He is not toxic-appearing.  HENT:     Head: Normocephalic and atraumatic.  Pulmonary:     Effort: Pulmonary effort is normal. No respiratory distress.  Skin:    Comments: Multiple excoriations all extremities.  RLE examined and excoriations and healed picked lesions extend up to hip.  BUE with infected wounds as pictured  Neurological:     Mental Status: He is alert and oriented to person, place, and time.  Psychiatric:        Behavior: Behavior normal.              Assessment & Plan:   Encounter Diagnoses  Name Primary?   Picking own skin Yes   Infected open wound       -Pt urged to stop picking -Rx septra  ds -Recommended dove or lever instead of his current soap -Pt to RTO on Thursday for recheck.  He is to RTO sooner for worsening or new symptoms

## 2024-04-07 ENCOUNTER — Other Ambulatory Visit: Payer: Self-pay | Admitting: Physician Assistant

## 2024-04-08 ENCOUNTER — Ambulatory Visit (HOSPITAL_COMMUNITY)
Admission: RE | Admit: 2024-04-08 | Discharge: 2024-04-08 | Disposition: A | Discharge status: Home / Self Care | Payer: Self-pay | Source: Ambulatory Visit | Attending: Physician Assistant | Admitting: Physician Assistant

## 2024-04-08 ENCOUNTER — Ambulatory Visit: Payer: Self-pay | Admitting: Physician Assistant

## 2024-04-08 ENCOUNTER — Encounter: Payer: Self-pay | Admitting: Physician Assistant

## 2024-04-08 VITALS — Temp 98.2°F

## 2024-04-08 DIAGNOSIS — R918 Other nonspecific abnormal finding of lung field: Secondary | ICD-10-CM | POA: Insufficient documentation

## 2024-04-08 DIAGNOSIS — R9389 Abnormal findings on diagnostic imaging of other specified body structures: Secondary | ICD-10-CM | POA: Insufficient documentation

## 2024-04-08 DIAGNOSIS — L089 Local infection of the skin and subcutaneous tissue, unspecified: Secondary | ICD-10-CM

## 2024-04-08 DIAGNOSIS — F424 Excoriation (skin-picking) disorder: Secondary | ICD-10-CM

## 2024-04-08 MED ORDER — FLUTICASONE FUROATE-VILANTEROL 100-25 MCG/ACT IN AEPB: 1.0000 | INHALATION_SPRAY | Freq: Every day | RESPIRATORY_TRACT | 1 refills | Status: DC

## 2024-04-08 NOTE — Progress Notes (Signed)
   Temp 98.2 F (36.8 C)    Subjective:    Patient ID: Edgar Roberts, male    DOB: 09-Jun-1961, 63 y.o.   MRN: [id number]  HPI: Edgar Roberts is a 63 y.o. male presenting on 04/08/2024 for Wound Check   HPI   Pt says he is taking his antibiotic.  He says he can see improvement.  He also changed his soap and is now using Lever and he likes it.  He is trying to avoid picking.   Relevant past medical, surgical, family and social history reviewed and updated as indicated. Interim medical history since our last visit reviewed. Allergies and medications reviewed and updated.  Review of Systems  Per HPI unless specifically indicated above     Objective:    Temp 98.2 F (36.8 C)   Wt Readings from Last 3 Encounters:  02/04/24 134 lb 12 oz (61.1 kg)  01/20/24 134 lb (60.8 kg)  12/30/23 129 lb 12 oz (58.9 kg)    Physical Exam Constitutional:      General: He is not in acute distress.    Appearance: He is not toxic-appearing.  HENT:     Head: Normocephalic and atraumatic.  Pulmonary:     Effort: Pulmonary effort is normal. No respiratory distress.   Skin:    Comments: Infected excoriations improved BUE   Neurological:     Mental Status: He is alert and oriented to person, place, and time.   Psychiatric:        Behavior: Behavior normal.           Assessment & Plan:    Encounter Diagnoses  Name Primary?   Infected open wound Yes   Picking own skin       -Pt to continue antibiotics -Pt encouraged to avoid picking -Discussed option of talking with [facility] for strategies to help reduce picking and he says he will think about it but decline for now -pt reminded of CT later today -he is to contact office if infection fails to resolve -he has routine follow up in august

## 2024-04-20 ENCOUNTER — Ambulatory Visit: Payer: Self-pay | Admitting: Physician Assistant

## 2024-06-02 ENCOUNTER — Other Ambulatory Visit: Payer: Self-pay | Admitting: Physician Assistant

## 2024-06-02 DIAGNOSIS — I10 Essential (primary) hypertension: Secondary | ICD-10-CM

## 2024-06-02 DIAGNOSIS — E785 Hyperlipidemia, unspecified: Secondary | ICD-10-CM

## 2024-06-02 DIAGNOSIS — Z131 Encounter for screening for diabetes mellitus: Secondary | ICD-10-CM

## 2024-06-02 DIAGNOSIS — R7309 Other abnormal glucose: Secondary | ICD-10-CM

## 2024-06-16 ENCOUNTER — Ambulatory Visit: Payer: Self-pay | Admitting: Physician Assistant

## 2024-06-16 ENCOUNTER — Encounter: Payer: Self-pay | Admitting: Physician Assistant

## 2024-06-16 VITALS — BP 102/56 | HR 89 | Temp 97.9°F | Wt 137.0 lb

## 2024-06-16 DIAGNOSIS — J449 Chronic obstructive pulmonary disease, unspecified: Secondary | ICD-10-CM

## 2024-06-16 DIAGNOSIS — R079 Chest pain, unspecified: Secondary | ICD-10-CM

## 2024-06-16 DIAGNOSIS — I10 Essential (primary) hypertension: Secondary | ICD-10-CM

## 2024-06-16 DIAGNOSIS — E785 Hyperlipidemia, unspecified: Secondary | ICD-10-CM

## 2024-06-16 DIAGNOSIS — K219 Gastro-esophageal reflux disease without esophagitis: Secondary | ICD-10-CM

## 2024-06-16 DIAGNOSIS — F424 Excoriation (skin-picking) disorder: Secondary | ICD-10-CM

## 2024-06-16 MED ORDER — ATORVASTATIN CALCIUM 40 MG PO TABS: ORAL_TABLET | ORAL | 0 refills | Status: DC

## 2024-06-16 MED ORDER — NITROGLYCERIN 0.4 MG SL SUBL: 0.4000 mg | SUBLINGUAL_TABLET | SUBLINGUAL | 99 refills | Status: AC | PRN

## 2024-06-16 MED ORDER — OMEPRAZOLE 40 MG PO CPDR: 40.0000 mg | DELAYED_RELEASE_CAPSULE | Freq: Two times a day (BID) | ORAL | 0 refills | Status: DC

## 2024-06-16 MED ORDER — FLUTICASONE FUROATE-VILANTEROL 100-25 MCG/ACT IN AEPB: 1.0000 | INHALATION_SPRAY | Freq: Every day | RESPIRATORY_TRACT | 1 refills | Status: DC

## 2024-06-16 MED ORDER — ALBUTEROL SULFATE HFA 108 (90 BASE) MCG/ACT IN AERS: INHALATION_SPRAY | RESPIRATORY_TRACT | 0 refills | Status: DC

## 2024-06-16 MED ORDER — AMLODIPINE BESYLATE 5 MG PO TABS: 5.0000 mg | ORAL_TABLET | Freq: Every day | ORAL | 1 refills | Status: DC

## 2024-06-16 MED ORDER — ASPIRIN EC 81 MG PO TBEC: 81.0000 mg | DELAYED_RELEASE_TABLET | Freq: Every day | ORAL | 1 refills | Status: DC

## 2024-06-16 NOTE — Patient Instructions (Addendum)
 Get nitroglycerin  Get aspirin  Get fasting labs drawn  [facility] financial assistance effective through 07/10/24

## 2024-06-16 NOTE — Progress Notes (Signed)
 BP (!) 102/56   Pulse 89   Temp 97.9 F (36.6 C)   SpO2 95%    Subjective:    Patient ID: Edgar Roberts, male    DOB: [birth year], 63 y.o.   MRN: [id number]  HPI: Edgar Roberts is a 63 y.o. male presenting on 06/16/2024 for Hypertension, Hyperlipidemia, COPD, Rash (On bilateral legs which are painful.), and Chest Pain (Pt states he has been having sharp pains on his chest. 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 Pain lasted about 3-4 minutes and made him SOB. Pt states he has been out of Nitrogycerin for about 2 weeks. Pt states he has been having cp bid everyday for the past week. None today so far.)   HPI  Chief Complaint  Patient presents with   Hypertension   Hyperlipidemia   COPD   Rash    On bilateral legs which are painful.   Chest Pain    Pt states he has been having sharp pains on his chest. 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 Pain lasted about 3-4 minutes and made him SOB. Pt states he has been out of Nitrogycerin for about 2 weeks. Pt states he has been having cp bid everyday for the past week. None today so far.      Pt Was taking NTG once every other week before he ran out.   CP with exertion and at rest.  Associated SOB.   When having cp and would take ntg, usually would require two doses before resolution of cp.    Extensive multi-vessel coronary artery calcification. On CT done 04/08/24.  Last lexiscan  was in 2021  Pt seen by cardiology march 2025  He is having no sob except that associated with CP.  Pt forgot to get his labs drawn.    Relevant past medical, surgical, family and social history reviewed and updated as indicated. Interim medical history since our last visit reviewed. Allergies and medications reviewed and updated.   Current Outpatient Medications:    albuterol  (VENTOLIN  HFA) 108 (90 Base) MCG/ACT inhaler, INHALE 2 PUFFS BY MOUTH EVERY 6 HOURS AS NEEDED FOR  COUGHING, WHEEZING, OR SHORTNESS OF BREATH, Disp: 20.1 g, Rfl: 0   amLODipine  (NORVASC ) 5 MG tablet, Take 1 tablet (5 mg total) by mouth daily., Disp: 90 tablet, Rfl: 1   aspirin  EC 81 MG tablet, Take 1 tablet (81 mg total) by mouth daily., Disp: 90 tablet, Rfl: 3   atorvastatin  (LIPITOR) 40 MG tablet, TAKE 1 Tablet BY MOUTH ONCE EVERY DAY, Disp: 90 tablet, Rfl: 0   Blood Pressure Monitor DEVI, 1 each by Does not apply route daily., Disp: 1 each, Rfl: 0   omeprazole  (PRILOSEC) 40 MG capsule, TAKE 1 Capsule  BY MOUTH TWICE DAILY, Disp: 180 capsule, Rfl: 0   fluticasone  furoate-vilanterol (BREO ELLIPTA ) 100-25 MCG/ACT AEPB, Inhale 1 puff into the lungs daily. (Patient not taking: Reported on 06/16/2024), Disp: 3 each, Rfl: 1   Fluticasone -Salmeterol (AIRDUO RESPICLICK 232/14) 232-14 MCG/ACT AEPB, Inhale 1 puff into the lungs 2 (two) times daily. (Patient not taking: Reported on 06/16/2024), Disp: , Rfl:    hydrocortisone  2.5 % ointment, APPLY TO THE AFFECTED AREA TOPICALLY TWICE DAILY (Patient not taking: Reported on 06/16/2024), Disp: 28.35 g, Rfl: 0   nitroGLYCERIN  (NITROSTAT ) 0.4 MG SL tablet, Place 1 tablet (0.4 mg total) under the tongue every 5 (five) minutes as needed for chest  pain. May repeat for total 3 doses.  If still having CP, go to ER (Patient not taking: Reported on 06/16/2024), Disp: 25 tablet, Rfl: PRN    Review of Systems  Per HPI unless specifically indicated above     Objective:    BP (!) 102/56   Pulse 89   Temp 97.9 F (36.6 C)   SpO2 95%   Wt Readings from Last 3 Encounters:  02/04/24 134 lb 12 oz (61.1 kg)  01/20/24 134 lb (60.8 kg)  12/30/23 129 lb 12 oz (58.9 kg)    Physical Exam Vitals reviewed.  Constitutional:      General: He is not in acute distress.    Appearance: He is not toxic-appearing.  HENT:     Head: Normocephalic and atraumatic.  Cardiovascular:     Rate and Rhythm: Normal rate and regular rhythm.  Pulmonary:     Effort: Pulmonary effort is  normal.     Breath sounds: Normal breath sounds. No wheezing.  Abdominal:     General: Bowel sounds are normal.     Palpations: Abdomen is soft.     Tenderness: There is no abdominal tenderness.  Musculoskeletal:     Cervical back: Neck supple.     Right lower leg: No edema.     Left lower leg: No edema.  Lymphadenopathy:     Cervical: No cervical adenopathy.  Skin:    General: Skin is warm and dry.     Findings: Lesion present.     Comments: Multiple excoriations on extremities, LE > UE.  No purulence or active infection.   Neurological:     Mental Status: He is alert and oriented to person, place, and time.  Psychiatric:        Behavior: Behavior normal.      EKG- SR at 71bpm.  Unremarkable.      Assessment & Plan:    Encounter Diagnoses  Name Primary?   Chest pain, unspecified type Yes   Primary hypertension    Hyperlipidemia, unspecified hyperlipidemia type    Chronic obstructive pulmonary disease, unspecified COPD type (HCC)    Gastroesophageal reflux disease, unspecified whether esophagitis present    Picking own skin      -pt to Get labs -Refill ntg.  Pt was reminded to go to ER for cp that persists after three doses -Continue asa -Get pt back in with cardiology -Again discussed that wounds would continue as long as he continues to pick.  Urged [facility] for behavior modification and he agreed -pt still needs to return to GI but needs evaluation by cardiology first -reminded pt that his [employer] financial assistance will need to be updated after it expires in september

## 2024-06-22 ENCOUNTER — Ambulatory Visit: Payer: Self-pay

## 2024-07-13 ENCOUNTER — Ambulatory Visit: Payer: Self-pay

## 2024-07-15 ENCOUNTER — Ambulatory Visit: Payer: Self-pay | Admitting: Physician Assistant

## 2024-07-27 ENCOUNTER — Ambulatory Visit: Payer: Self-pay | Admitting: Physician Assistant

## 2024-07-27 ENCOUNTER — Ambulatory Visit: Payer: Self-pay

## 2024-07-27 DIAGNOSIS — J069 Acute upper respiratory infection, unspecified: Secondary | ICD-10-CM

## 2024-07-27 NOTE — Progress Notes (Signed)
   There were no vitals taken for this visit.   Subjective:    Patient ID: Edgar Roberts, male    DOB: 10/15/1961, 63 y.o.   MRN: [id number]  HPI: Edgar Roberts is a 63 y.o. male presenting on 07/27/2024 for No chief complaint on file.   HPI  This is a telemedicine appointment through [employer].  I connected with  Edgar Roberts on 07/27/24 by a video enabled telemedicine application and verified that I am speaking with the correct person using two identifiers.   I discussed the limitations of evaluation and management by telemedicine. The patient expressed understanding and agreed to proceed.   Pt is in parked car. Provider is at office.    Pt is 63yoM who was scheduled for routine follow up but is feeling sick.  He started feeling bad late Saturday/early Sunday.  He says he has subjective fever, no appetite, body aches, and just feels bad.  He says his nose is stopped up, he has some cough, no sob.  He has some cold sweats.  He says he worries he has gone into too many apartments of people with covid (he works apartment maintenance) He denies emesis, diarrhea.   Relevant past medical, surgical, family and social history reviewed and updated as indicated. Interim medical history since our last visit reviewed. Allergies and medications reviewed and updated.  Review of Systems  Per HPI unless specifically indicated above     Objective:    There were no vitals taken for this visit.  Wt Readings from Last 3 Encounters:  06/16/24 137 lb (62.1 kg)  02/04/24 134 lb 12 oz (61.1 kg)  01/20/24 134 lb (60.8 kg)    Physical Exam Constitutional:      General: He is not in acute distress.    Appearance: He is not toxic-appearing.     Comments: Pt looks like he doesn't feel well   HENT:     Nose: Congestion and rhinorrhea present.  Pulmonary:     Effort: Pulmonary effort is normal. No respiratory distress.     Comments: Pt is talking in complete sentences without  dyspnea Neurological:     Mental Status: He is oriented to person, place, and time.  Psychiatric:        Behavior: Behavior normal.             Assessment & Plan:    Encounter Diagnosis  Name Primary?   Upper respiratory tract infection, unspecified type Yes       -pt counseled to rest, get plenty of fluids, apap or ibu prn.  He has plenty albuterol  mdi that he can use if he needs.   -He can use otc mucinex and/or delsym prn -pt is given note to be out of work with RTW Monday 08/02/24, -pt requests that we call cardiology office to r/s the appointment he has for tomorrow -pt is rescheduled to return here in two weeks -pt is to contact office  sooner prn worsening or new symptoms

## 2024-07-28 ENCOUNTER — Ambulatory Visit: Payer: Self-pay | Admitting: Nurse Practitioner

## 2024-08-12 ENCOUNTER — Encounter: Payer: Self-pay | Admitting: Gastroenterology

## 2024-08-17 ENCOUNTER — Ambulatory Visit: Payer: Self-pay

## 2024-08-17 ENCOUNTER — Other Ambulatory Visit (HOSPITAL_COMMUNITY)
Admission: RE | Admit: 2024-08-17 | Discharge: 2024-08-17 | Disposition: A | Discharge status: Home / Self Care | Payer: Self-pay | Source: Ambulatory Visit | Attending: Physician Assistant | Admitting: Physician Assistant

## 2024-08-17 ENCOUNTER — Ambulatory Visit: Payer: Self-pay | Admitting: Physician Assistant

## 2024-08-17 ENCOUNTER — Encounter: Payer: Self-pay | Admitting: Physician Assistant

## 2024-08-17 ENCOUNTER — Other Ambulatory Visit: Payer: Self-pay | Admitting: Physician Assistant

## 2024-08-17 VITALS — BP 112/65 | HR 86 | Temp 96.9°F | Ht 69.0 in | Wt 135.0 lb

## 2024-08-17 DIAGNOSIS — F424 Excoriation (skin-picking) disorder: Secondary | ICD-10-CM

## 2024-08-17 DIAGNOSIS — L089 Local infection of the skin and subcutaneous tissue, unspecified: Secondary | ICD-10-CM

## 2024-08-17 DIAGNOSIS — Z1211 Encounter for screening for malignant neoplasm of colon: Secondary | ICD-10-CM

## 2024-08-17 DIAGNOSIS — I10 Essential (primary) hypertension: Secondary | ICD-10-CM

## 2024-08-17 DIAGNOSIS — E785 Hyperlipidemia, unspecified: Secondary | ICD-10-CM | POA: Insufficient documentation

## 2024-08-17 DIAGNOSIS — J449 Chronic obstructive pulmonary disease, unspecified: Secondary | ICD-10-CM

## 2024-08-17 DIAGNOSIS — Z131 Encounter for screening for diabetes mellitus: Secondary | ICD-10-CM | POA: Insufficient documentation

## 2024-08-17 DIAGNOSIS — R7309 Other abnormal glucose: Secondary | ICD-10-CM | POA: Insufficient documentation

## 2024-08-17 DIAGNOSIS — K219 Gastro-esophageal reflux disease without esophagitis: Secondary | ICD-10-CM

## 2024-08-17 LAB — LIPID PANEL
Cholesterol: 104 mg/dL (ref 0–200)
HDL: 33 mg/dL — ABNORMAL LOW (ref >40)
LDL Cholesterol: 56 mg/dL (ref 0–99)
Total CHOL/HDL Ratio: 3.1 ratio
Triglycerides: 73 mg/dL (ref <150)
VLDL: 15 mg/dL (ref 0–40)

## 2024-08-17 LAB — COMPREHENSIVE METABOLIC PANEL WITH GFR
ALT: 11 U/L (ref 0–44)
AST: 19 U/L (ref 15–41)
Albumin: 4.4 g/dL (ref 3.5–5.0)
Alkaline Phosphatase: 95 U/L (ref 38–126)
Anion gap: 10 (ref 5–15)
BUN: 7 mg/dL — ABNORMAL LOW (ref 8–23)
CO2: 27 mmol/L (ref 22–32)
Calcium: 9.3 mg/dL (ref 8.9–10.3)
Chloride: 99 mmol/L (ref 98–111)
Creatinine, Ser: 0.74 mg/dL (ref 0.61–1.24)
GFR, Estimated: >60 mL/min (ref >60)
Glucose, Bld: 101 mg/dL — ABNORMAL HIGH (ref 70–99)
Potassium: 4 mmol/L (ref 3.5–5.1)
Sodium: 136 mmol/L (ref 135–145)
Total Bilirubin: 0.5 mg/dL (ref 0.0–1.2)
Total Protein: 8 g/dL (ref 6.5–8.1)

## 2024-08-17 LAB — HEMOGLOBIN A1C
Hgb A1c MFr Bld: 5.4 % (ref 4.8–5.6)
Mean Plasma Glucose: 108.28 mg/dL

## 2024-08-17 MED ORDER — CEPHALEXIN 500 MG PO CAPS: 500.0000 mg | ORAL_CAPSULE | Freq: Three times a day (TID) | ORAL | 0 refills | Status: AC

## 2024-08-17 NOTE — Progress Notes (Signed)
 BP 112/65   Pulse 86   Temp (!) 96.9 F (36.1 C)   Ht 5' 9 (1.753 m)   Wt 135 lb (61.2 kg)   SpO2 95%   BMI 19.94 kg/m    Subjective:    Patient ID: Edgar Roberts, male    DOB: Mar 16, 1961, 63 y.o.   MRN: [id number]  HPI: Edgar Roberts is a 63 y.o. male presenting on 08/17/2024 for Follow-up   HPI   Pt is 63yoM in today for routine follow up.  He says he is doing fine.  He says his cp doesn't seem to be much lately.  His breathing has been okay; he uses his inhalers.  He has been working diligently on avoiding picking his skin.   He says he's been eating normally.   Relevant past medical, surgical, family and social history reviewed and updated as indicated. Interim medical history since our last visit reviewed. Allergies and medications reviewed and updated.   Current Outpatient Medications:    albuterol  (VENTOLIN  HFA) 108 (90 Base) MCG/ACT inhaler, INHALE 2 PUFFS BY MOUTH EVERY 6 HOURS AS NEEDED FOR COUGHING, WHEEZING, OR SHORTNESS OF BREATH, Disp: 20.1 g, Rfl: 0   amLODipine  (NORVASC ) 5 MG tablet, Take 1 tablet (5 mg total) by mouth daily., Disp: 90 tablet, Rfl: 1   aspirin  EC 81 MG tablet, Take 1 tablet (81 mg total) by mouth daily., Disp: 90 tablet, Rfl: 1   atorvastatin  (LIPITOR) 40 MG tablet, TAKE 1 Tablet BY MOUTH ONCE EVERY DAY, Disp: 90 tablet, Rfl: 0   Blood Pressure Monitor DEVI, 1 each by Does not apply route daily., Disp: 1 each, Rfl: 0   fluticasone  furoate-vilanterol (BREO ELLIPTA ) 100-25 MCG/ACT AEPB, Inhale 1 puff into the lungs daily., Disp: 3 each, Rfl: 1   nitroGLYCERIN  (NITROSTAT ) 0.4 MG SL tablet, Place 1 tablet (0.4 mg total) under the tongue every 5 (five) minutes as needed for chest pain. May repeat for total 3 doses.  If still having CP, go to ER, Disp: 25 tablet, Rfl: PRN   omeprazole  (PRILOSEC) 40 MG capsule, Take 1 capsule (40 mg total) by mouth 2 (two) times daily., Disp: 180 capsule, Rfl: 0   hydrocortisone  2.5 % ointment, APPLY TO THE  AFFECTED AREA TOPICALLY TWICE DAILY (Patient not taking: Reported on 08/17/2024), Disp: 28.35 g, Rfl: 0     Review of Systems  Per HPI unless specifically indicated above     Objective:    BP 112/65   Pulse 86   Temp (!) 96.9 F (36.1 C)   Ht 5' 9 (1.753 m)   Wt 135 lb (61.2 kg)   SpO2 95%   BMI 19.94 kg/m   Wt Readings from Last 3 Encounters:  08/17/24 135 lb (61.2 kg)  06/16/24 137 lb (62.1 kg)  02/04/24 134 lb 12 oz (61.1 kg)    Physical Exam Vitals reviewed.  Constitutional:      General: He is not in acute distress.    Appearance: He is not toxic-appearing.  HENT:     Head: Normocephalic and atraumatic.  Cardiovascular:     Rate and Rhythm: Normal rate and regular rhythm.  Pulmonary:     Effort: Pulmonary effort is normal.     Breath sounds: Normal breath sounds. No wheezing.  Abdominal:     General: Bowel sounds are normal.     Palpations: Abdomen is soft.     Tenderness: There is no abdominal tenderness.  Musculoskeletal:     Cervical  back: Neck supple.     Right lower leg: No edema.     Left lower leg: No edema.  Lymphadenopathy:     Cervical: No cervical adenopathy.  Skin:    General: Skin is warm and dry.     Comments: No open wounds on the arms.  Most previous wounds are in early scar formation with a few in late scab.  BLE with some small thin scabs.  LLE with wound lateral aspect just distal to the knee with erythema and firmness.  No fluctuance.   Neurological:     Mental Status: He is alert and oriented to person, place, and time.  Psychiatric:        Behavior: Behavior normal.           Assessment & Plan:   Encounter Diagnoses  Name Primary?   Hyperlipidemia, unspecified hyperlipidemia type Yes   Primary hypertension    Chronic obstructive pulmonary disease, unspecified COPD type (HCC)    Gastroesophageal reflux disease, unspecified whether esophagitis present    Picking own skin    Infected open wound       -pt was given  FIT for colon cancer screening (pt was scheduled for colonoscopy 2024 but he cancelled it and never rescheduled) -Labs pending.  He will be called with results -Pt to continue current medications -rx kelfex for infected wound LLE.   -he is congratulated on his improvements in decreased picking of his skin and is encouraged to continue this -pt has appointment with cardiology next month for chest pain -pt will RTO in 3 months.  He is to contact office sooner prn

## 2024-08-18 ENCOUNTER — Ambulatory Visit: Payer: Self-pay | Admitting: Physician Assistant

## 2024-08-19 NOTE — Progress Notes (Signed)
 Brief f/u after >1 year gap with Carlin for individual [facility] services for behavior change around skin picking/scratching. Patient presents alone. BHP spent 15 minutes with patient.    Patient notes health concerns tied to difficulty avoiding scratching scabs. Previously reported anxious symptoms including racing thoughts, difficulty relaxing, poor eating, and autonomic hyperactivity related to interpersonal stressors. With resolved stressors, pt reports no symptoms of distress except desired behavior change recommended per Provider following infection.    This procedure has been fully reviewed with the patient and informed consent has been obtained.   Patient location: Pt seen in clinic via telehealth.   I connected with Carlin on 08/17/24 by a video enabled telemedicine application and verified that I am speaking with the correct person.   I discussed the limitations of evaluation and management by telemedicine. The patient expressed understanding and agreed to proceed.      HISTORY OF PRESENT ILLNESS   Mental Status: Edgar Roberts presented oriented X4. Mood and affect were euthymic and appropriate. Judgment was appropriate. Memory was appropriate. Thought processes and content were  appropriate. Speech was appropriate. Patient denies SI/HI.     Narrative: Met with pt for scheduled appointment. Person-centered approach used to continue therapeutic alliance. Pt provides updates since previous visit including problem behavior negatively impacting health. Suggested pt keep scabs covered and consider replacement behaviors such as snapping a rubber band or squeezing a stress ball. Encouraged pt to explore possible triggers or stressors that may be contributing. Normalization provided. Assessed for any other concerns pt wanted to address today, which they denied. Plan to reevaluate progress when pt f/u in clinic in January, with pt aware to reach out as desired should symptoms worsen or change

## 2024-09-22 ENCOUNTER — Ambulatory Visit: Payer: Self-pay | Attending: Cardiology | Admitting: Cardiology

## 2024-09-22 VITALS — BP 118/72 | HR 76 | Ht 72.0 in | Wt 141.0 lb

## 2024-09-22 DIAGNOSIS — R079 Chest pain, unspecified: Secondary | ICD-10-CM

## 2024-09-22 DIAGNOSIS — I10 Essential (primary) hypertension: Secondary | ICD-10-CM

## 2024-09-22 DIAGNOSIS — E782 Mixed hyperlipidemia: Secondary | ICD-10-CM

## 2024-09-22 NOTE — Progress Notes (Signed)
 Clinical Summary Edgar Roberts is a 63 y.o.male seen today for follow up of the following medical problems.      1. Chest pain - started 3-4 months ago. Funny feeling left chest and radiates into arm and hand. Tends to occur with activity. Can cause some diaphoresis and nausea. + SOB. Can be worst with deep breaths. No relation to food. Lasts about 10-20 minutes. Occurs 3 times a week. - notes some generalized fatigue. Notes some DOE with walking uphill - no LE edema, no orthopna   CAD risk factors: tobacco, sister with open heart surgeries and stents.     Jan 2021 nuclear stress: inferior/inferoseptal infarct, no current ischemia. Low risk study - infrequent chest pains, once every 2 weeks. Lasts about just a few seconds. Pressure like feeling. +SOB. Usually with activity, though can occur at rest.  - walks up flights of stairs, up hills. At times can have some chest discomfort.  - cannot travel to [facility] - cannot run on treadmill due to chronic back pain and hip pain.      2.Hyperlipiddemia  - 12/2021 TC 136 TG 71 hDL 35 LDL 87 - 08/2022 TC 125 TG 54 HDL 36 LDL 78 - 12/2023 TC 837 TG 898 HDL 26 LDL 116. Reports recent GI illness did not take statin regularly.  07/2024 TC 104 TG 73 HDL 33 LDL 56 - compliant with statin   3. HTN - compliant with meds     4.Syncope - 05/2023 echo: LVEF 55-60%, no WMAs, grade I dd.  - 05/2023 14 day monitor: rare ectopy, 3 runs NSVT longest 7 beats - episode thought to be vasovagal - no recurrent symptoms.    - no recent episodes.    5. Remote tobacco - just smoked for years as teenager.     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 Past Medical History:  Diagnosis Date   Asthma    Chronic hepatitis C (HCC)    treated, F3/F4 needs surveillance   COPD (chronic obstructive pulmonary disease) (HCC)    GERD (gastroesophageal reflux disease)      Allergies  Allergen Reactions   Bee Venom Swelling    Bees and yellow Jackets    Other Rash    Poison oak     Current Outpatient Medications  Medication Sig Dispense Refill   albuterol  (VENTOLIN  HFA) 108 (90 Base) MCG/ACT inhaler INHALE 2 PUFFS BY MOUTH EVERY 6 HOURS AS NEEDED FOR COUGHING, WHEEZING, OR SHORTNESS OF BREATH 20.1 g 0   amLODipine  (NORVASC ) 5 MG tablet Take 1 tablet (5 mg total) by mouth daily. 90 tablet 1   aspirin  EC 81 MG tablet Take 1 tablet (81 mg total) by mouth daily. 90 tablet 1   atorvastatin  (LIPITOR) 40 MG tablet TAKE 1 Tablet BY MOUTH ONCE EVERY DAY 90 tablet 0   Blood Pressure Monitor DEVI 1 each by Does not apply route daily. 1 each 0   fluticasone  furoate-vilanterol (BREO ELLIPTA ) 100-25 MCG/ACT AEPB Inhale 1 puff into the lungs daily. 3 each 1   hydrocortisone  2.5 % ointment APPLY TO THE AFFECTED AREA TOPICALLY TWICE DAILY (Patient not taking: Reported on 08/17/2024) 28.35 g 0   nitroGLYCERIN  (NITROSTAT ) 0.4 MG SL tablet Place 1 tablet (0.4 mg total) under the tongue every 5 (five) minutes as needed for chest pain. May repeat for total 3 doses.  If still having CP, go to ER 25 tablet PRN   omeprazole  (PRILOSEC) 40 MG capsule Take 1  capsule (40 mg total) by mouth 2 (two) times daily. 180 capsule 0   No current facility-administered medications for this visit.     Past Surgical History:  Procedure Laterality Date   BIOPSY N/A 09/04/2015   Procedure: BIOPSY;  Surgeon: Lamar CHRISTELLA Hollingshead, MD;  Location: [facility];  Service: Endoscopy;  Laterality: N/A;  gastric   ESOPHAGEAL DILATION N/A 09/04/2015   Procedure:  ESOPHAGEAL DILATION;  Surgeon: Lamar CHRISTELLA Hollingshead, MD;  Location: [facility];  Service: Endoscopy;  Laterality: N/A;  maloney 56   ESOPHAGOGASTRODUODENOSCOPY (EGD) WITH PROPOFOL  N/A 09/04/2015   Dr. Hollingshead: mild erosive reflux esophagitis. H.pylori gastritis   INGUINAL HERNIA REPAIR Right 06/14/2022   Procedure: HERNIA REPAIR INGUINAL ADULT;  Surgeon: Kallie Manuelita BROCKS, MD;  Location: [facility];  Service: General;  Laterality: Right;   none        Allergies  Allergen Reactions   Bee Venom Swelling    Bees and yellow Jackets   Other Rash    Poison oak      Family History  Problem Relation Age of Onset   Cirrhosis Father        deceased   Heart disease Sister        3 open heart surgeries   Heart disease Mother    Heart disease Other        grandmother   Colon cancer Neg Hx      Social History Edgar Roberts reports that he quit smoking about 41 years ago. His smoking use included cigarettes. He started smoking about 48 years ago. He has a 12.8 pack-year smoking history. He has never used smokeless tobacco. Edgar Roberts reports no history of alcohol use.   Physical Examination Today's Vitals   09/22/24 1103  BP: 118/72  Pulse: 76  SpO2: 96%  Weight: 141 lb (64 kg)  Height: 6' (1.829 m)   Body mass index is 19.12 kg/m.  Gen: resting comfortably, no acute distress HEENT: no scleral icterus, pupils equal round and reactive, no palptable cervical adenopathy,  CV: RRR, no mrg, no jvd Resp: Clear to auscultation bilaterally GI: abdomen is soft, non-tender, non-distended, normal bowel sounds, no hepatosplenomegaly MSK: extremities are warm, no edema.  Skin: warm, no rash Neuro:  no focal deficits Psych: appropriate affect   Diagnostic Studies 06/2015 GXT There was no ST segment deviation noted during stress.   Clinically and electrically negative for ischemia.  Very good exercise tolerance     Jan 2021 nuclear stress test There was no ST segment deviation noted during stress. 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 This is a low risk study. There is no current myocardium at jeopardy The left ventricular ejection fraction is low normal (50%).      Assessment and Plan   1. Chest pain - long history, some recent exertional symptoms. Last ischemic testing was in 2021 - not able to travel to [facility] for coronary CTA - plan for lexiscan  to further evaluate. Not  able to run on treadmill due to chronic back and hip pains.  - EKG today shows NSR, no acute ischemic changes   2. HTN -bp at goal  3.Hyperlipidemia - lipids at goal, continue current meds      Dorn PHEBE Ross, M.D.

## 2024-09-22 NOTE — Patient Instructions (Signed)
 Medication Instructions:  Your physician recommends that you continue on your current medications as directed. Please refer to the Current Medication list given to you today.  *If you need a refill on your cardiac medications before your next appointment, please call your pharmacy*  Lab Work: None If you have labs (blood work) drawn today and your tests are completely normal, you will receive your results only by: MyChart Message (if you have MyChart) OR A paper copy in the mail If you have any lab test that is abnormal or we need to change your treatment, we will call you to review the results.  Testing/Procedures: Your physician has requested that you have a lexiscan  myoview . For further information please visit [email or web address] Please follow instruction sheet, as given.   Follow-Up: At [facility], you and your health needs are our priority.  As part of our continuing mission to provide you with exceptional heart care, our providers are all part of one team.  This team includes your primary Cardiologist (physician) and Advanced Practice Providers or APPs (Physician Assistants and Nurse Practitioners) who all work together to provide you with the care you need, when you need it.  Your next appointment:   2 month(s)  Provider:   You may see Alvan Carrier, MD or the following Advanced Practice Provider on your designated Care Team:   Almarie Crate, NP    We recommend signing up for the patient portal called MyChart.  Sign up information is provided on this After Visit Summary.  MyChart is used to connect with patients for Virtual Visits (Telemedicine).  Patients are able to view lab/test results, encounter notes, upcoming appointments, etc.  Non-urgent messages can be sent to your provider as well.   To learn more about what you can do with MyChart, go to [email or web address].   Other Instructions

## 2024-09-29 ENCOUNTER — Encounter (HOSPITAL_COMMUNITY)
Admission: RE | Admit: 2024-09-29 | Discharge: 2024-09-29 | Disposition: A | Discharge status: Home / Self Care | Payer: Self-pay | Source: Ambulatory Visit | Attending: Cardiology | Admitting: Cardiology

## 2024-09-29 ENCOUNTER — Other Ambulatory Visit: Payer: Self-pay | Admitting: Physician Assistant

## 2024-09-29 ENCOUNTER — Encounter (HOSPITAL_BASED_OUTPATIENT_CLINIC_OR_DEPARTMENT_OTHER)
Admission: RE | Admit: 2024-09-29 | Discharge: 2024-09-29 | Disposition: A | Discharge status: Home / Self Care | Payer: Self-pay | Source: Ambulatory Visit | Attending: Cardiology | Admitting: Cardiology

## 2024-09-29 ENCOUNTER — Encounter (HOSPITAL_COMMUNITY): Payer: Self-pay

## 2024-09-29 DIAGNOSIS — R079 Chest pain, unspecified: Secondary | ICD-10-CM | POA: Insufficient documentation

## 2024-09-29 HISTORY — DX: Essential (primary) hypertension: I10

## 2024-09-29 LAB — NM MYOCAR MULTI W/SPECT W/WALL MOTION / EF
LV dias vol: 102 mL (ref 62–150)
LV sys vol: 44 mL (ref 4.2–5.8)
MPHR: 157 {beats}/min
Nuc Stress EF: 57 %
Peak HR: 111 {beats}/min
Percent HR: 70 %
RATE: 0.3
Rest HR: 72 {beats}/min
Rest Nuclear Isotope Dose: 10.5 mCi
SDS: 0
SRS: 5
SSS: 5
ST Depression (mm): 0 mm
Stress Nuclear Isotope Dose: 29 mCi
TID: 0.99

## 2024-09-29 MED ORDER — TECHNETIUM TC 99M TETROFOSMIN IV KIT
10.0000 | PACK | Freq: Once | INTRAVENOUS | Status: AC | PRN
  Administered 2024-09-29: 10.5 via INTRAVENOUS

## 2024-09-29 MED ORDER — REGADENOSON 0.4 MG/5ML IV SOLN
INTRAVENOUS | Status: AC
  Administered 2024-09-29: 0.4 mg via INTRAVENOUS
  Filled 2024-09-29: qty 5

## 2024-09-29 MED ORDER — TECHNETIUM TC 99M TETROFOSMIN IV KIT
30.0000 | PACK | Freq: Once | INTRAVENOUS | Status: AC | PRN
  Administered 2024-09-29: 29 via INTRAVENOUS

## 2024-09-29 MED ORDER — SODIUM CHLORIDE FLUSH 0.9 % IV SOLN
INTRAVENOUS | Status: AC
  Administered 2024-09-29: 10 mL via INTRAVENOUS
  Filled 2024-09-29: qty 10

## 2024-09-29 NOTE — Progress Notes (Signed)
     Edgar Roberts presented for a Lexiscan  nuclear stress test today.  I Lorette CINDERELLA Kapur, PA-C, provided direct supervision and was present during the stress portion of the study today, which was completed without significant symptoms, immediate complications, or acute ST/T changes on ECG.  Stress imaging is pending at this time.  Preliminary ECG findings may be listed in the chart, but the stress test result will not be finalized until perfusion imaging is complete.  Lorette CINDERELLA Kapur, PA-C  09/29/2024, 11:22 AM

## 2024-10-13 ENCOUNTER — Ambulatory Visit: Payer: Self-pay | Admitting: Cardiology

## 2024-11-03 NOTE — Congregational Nurse Program (Signed)
 Client walked into [facility] today to renew his Care Connect application and [facility]ist. Client is current with [facility], He was last seen by provider 08/18/24 and [facility]. He also see the [facility] [place].  He reports he is also following closely with his Cardiologist and was last see 09/28/24 and will be seen there again 11/29/24  He reports he currently has all of his medications. He completed MedAssist application with A. Spruill as well as renewed his Care Connect application today. He began renewal process also for CAFA with A. Spruill.  Social drivers of health questionnaire reviewed and no current needs expressed.  PHQ 9 completed today and was =0  Client has no concerns or questions today and he is aware that he may call the office for any needs or resource assistance.   Avelina JONELLE Skeen RN [facility] RN [facility]

## 2024-11-04 ENCOUNTER — Other Ambulatory Visit: Payer: Self-pay | Admitting: Physician Assistant

## 2024-11-04 DIAGNOSIS — E785 Hyperlipidemia, unspecified: Secondary | ICD-10-CM

## 2024-11-04 DIAGNOSIS — I10 Essential (primary) hypertension: Secondary | ICD-10-CM

## 2024-11-04 DIAGNOSIS — Z125 Encounter for screening for malignant neoplasm of prostate: Secondary | ICD-10-CM

## 2024-11-17 ENCOUNTER — Ambulatory Visit: Payer: Self-pay

## 2024-11-17 ENCOUNTER — Ambulatory Visit: Payer: Self-pay | Admitting: Physician Assistant

## 2024-11-23 ENCOUNTER — Ambulatory Visit: Payer: Self-pay | Admitting: Physician Assistant

## 2024-11-29 ENCOUNTER — Ambulatory Visit: Payer: Self-pay | Admitting: Cardiology

## 2024-11-30 ENCOUNTER — Ambulatory Visit: Payer: Self-pay | Admitting: Cardiology

## 2024-11-30 ENCOUNTER — Encounter: Payer: Self-pay | Admitting: Cardiology

## 2024-11-30 VITALS — BP 114/70 | HR 78 | Ht 72.0 in | Wt 142.8 lb

## 2024-11-30 DIAGNOSIS — R0789 Other chest pain: Secondary | ICD-10-CM

## 2024-11-30 NOTE — Patient Instructions (Signed)
 Medication Instructions:  Continue all current medications.   Labwork: none  Testing/Procedures: none  Follow-Up: 6 months   Any Other Special Instructions Will Be Listed Below (If Applicable).   If you need a refill on your cardiac medications before your next appointment, please call your pharmacy.

## 2024-12-01 ENCOUNTER — Encounter: Payer: Self-pay | Admitting: Physician Assistant

## 2024-12-01 ENCOUNTER — Ambulatory Visit: Payer: Self-pay | Admitting: Physician Assistant

## 2024-12-01 VITALS — BP 122/70 | HR 78 | Temp 97.8°F

## 2024-12-01 DIAGNOSIS — J449 Chronic obstructive pulmonary disease, unspecified: Secondary | ICD-10-CM

## 2024-12-01 DIAGNOSIS — K4090 Unilateral inguinal hernia, without obstruction or gangrene, not specified as recurrent: Secondary | ICD-10-CM

## 2024-12-01 DIAGNOSIS — I10 Essential (primary) hypertension: Secondary | ICD-10-CM

## 2024-12-01 DIAGNOSIS — E785 Hyperlipidemia, unspecified: Secondary | ICD-10-CM

## 2024-12-01 MED ORDER — AMLODIPINE BESYLATE 5 MG PO TABS: 5.0000 mg | ORAL_TABLET | Freq: Every day | ORAL | 1 refills | Status: AC

## 2024-12-01 MED ORDER — OMEPRAZOLE 40 MG PO CPDR: 40.0000 mg | DELAYED_RELEASE_CAPSULE | Freq: Two times a day (BID) | ORAL | 0 refills | Status: AC

## 2024-12-01 MED ORDER — ALBUTEROL SULFATE HFA 108 (90 BASE) MCG/ACT IN AERS: INHALATION_SPRAY | RESPIRATORY_TRACT | 0 refills | Status: AC

## 2024-12-01 MED ORDER — HYDROCORTISONE 2.5 % EX OINT: TOPICAL_OINTMENT | Freq: Two times a day (BID) | CUTANEOUS | 0 refills | Status: AC | PRN

## 2024-12-01 MED ORDER — ATORVASTATIN CALCIUM 40 MG PO TABS: ORAL_TABLET | ORAL | 0 refills | Status: AC

## 2024-12-01 MED ORDER — ASPIRIN EC 81 MG PO TBEC: 81.0000 mg | DELAYED_RELEASE_TABLET | Freq: Every day | ORAL | 1 refills | Status: AC

## 2024-12-01 MED ORDER — FLUTICASONE FUROATE-VILANTEROL 100-25 MCG/ACT IN AEPB: 1.0000 | INHALATION_SPRAY | Freq: Every day | RESPIRATORY_TRACT | 1 refills | Status: AC

## 2024-12-01 NOTE — Progress Notes (Signed)
 "  BP 122/70   Pulse 78   Temp 97.8 F (36.6 C)   SpO2 96%    Subjective:    Patient ID: Edgar Roberts, male    DOB: Apr 08, 1961, 64 y.o.   MRN: [id number]  HPI: Edgar Roberts is a 64 y.o. male presenting on 12/01/2024 for Hypertension, Hyperlipidemia, and COPD   HPI  Chief Complaint  Patient presents with   Hypertension   Hyperlipidemia   COPD    Pt says he is doing well.  He went to cardiology appointment yesterday.   He says his left inguinal hernia is hurting him a lot.  He had right inguinal hernia surgery in 2023   Relevant past medical, surgical, family and social history reviewed and updated as indicated. Interim medical history since our last visit reviewed. Allergies and medications reviewed and updated.   Current Outpatient Medications:    nitroGLYCERIN  (NITROSTAT ) 0.4 MG SL tablet, Place 1 tablet (0.4 mg total) under the tongue every 5 (five) minutes as needed for chest pain. May repeat for total 3 doses.  If still having CP, go to ER, Disp: 25 tablet, Rfl: PRN   albuterol  (VENTOLIN  HFA) 108 (90 Base) MCG/ACT inhaler, INHALE 2 PUFFS BY MOUTH EVERY 6 HOURS AS NEEDED FOR COUGHING, WHEEZING, OR SHORTNESS OF BREATH, Disp: 20.1 g, Rfl: 0   amLODipine  (NORVASC ) 5 MG tablet, Take 1 tablet (5 mg total) by mouth daily., Disp: 90 tablet, Rfl: 1   aspirin  EC 81 MG tablet, Take 1 tablet (81 mg total) by mouth daily., Disp: 90 tablet, Rfl: 1   atorvastatin  (LIPITOR) 40 MG tablet, TAKE 1 Tablet BY MOUTH ONCE EVERY DAY, Disp: 90 tablet, Rfl: 0   fluticasone  furoate-vilanterol (BREO ELLIPTA ) 100-25 MCG/ACT AEPB, Inhale 1 puff into the lungs daily., Disp: 3 each, Rfl: 1   hydrocortisone  2.5 % ointment, Apply topically 2 (two) times daily as needed. APPLY TO THE AFFECTED AREA TOPICALLY TWICE DAILY as needed, Disp: 453 g, Rfl: 0   omeprazole  (PRILOSEC) 40 MG capsule, Take 1 capsule (40 mg total) by mouth 2 (two) times daily., Disp: 180 capsule, Rfl: 0    Review of Systems  Per  HPI unless specifically indicated above     Objective:    BP 122/70   Pulse 78   Temp 97.8 F (36.6 C)   SpO2 96%   Wt Readings from Last 3 Encounters:  11/30/24 142 lb 12.8 oz (64.8 kg)  09/22/24 141 lb (64 kg)  08/17/24 135 lb (61.2 kg)    Physical Exam Vitals reviewed.  Constitutional:      General: He is not in acute distress.    Appearance: He is well-developed. He is not toxic-appearing.  HENT:     Head: Normocephalic and atraumatic.  Cardiovascular:     Rate and Rhythm: Normal rate and regular rhythm.  Pulmonary:     Effort: Pulmonary effort is normal.     Breath sounds: Normal breath sounds. No wheezing.  Abdominal:     General: Bowel sounds are normal.     Palpations: Abdomen is soft.     Tenderness: There is no abdominal tenderness.  Musculoskeletal:     Cervical back: Neck supple.     Right lower leg: No edema.     Left lower leg: No edema.  Lymphadenopathy:     Cervical: No cervical adenopathy.  Skin:    General: Skin is warm and dry.     Findings: Lesion present.  Comments: Multiple open excoriated areas LLE.  No abscess or secondary infection seen  Neurological:     Mental Status: He is alert and oriented to person, place, and time.  Psychiatric:        Behavior: Behavior normal.          Assessment & Plan:   Encounter Diagnoses  Name Primary?   Primary hypertension Yes   Hyperlipidemia, unspecified hyperlipidemia type    Chronic obstructive pulmonary disease, unspecified COPD type (HCC)    Left inguinal hernia       -pt to Get labs drawn.  He will be called with results  -no changes to meds today.  Refills sent -Refer back to surgery for left inguinal hernia  -pt was given application for [employer] financial assistance -pt was Reminded to return FIT test given to him in October for colon cancer screening -pt to follow up 3 months.  He is to contact office sooner prn   "

## 2024-12-21 ENCOUNTER — Ambulatory Visit: Payer: Self-pay | Admitting: General Surgery

## 2025-03-01 ENCOUNTER — Ambulatory Visit: Payer: Self-pay | Admitting: Physician Assistant
